# Patient Record
Sex: Male | Born: 1960
Health system: Southern US, Community
[De-identification: ages and names within clinical notes are randomized; demographics above are authoritative.]

## PROBLEM LIST (undated history)

## (undated) DIAGNOSIS — N319 Neuromuscular dysfunction of bladder, unspecified: Secondary | ICD-10-CM

## (undated) DIAGNOSIS — F419 Anxiety disorder, unspecified: Secondary | ICD-10-CM

## (undated) DIAGNOSIS — Z86711 Personal history of pulmonary embolism: Secondary | ICD-10-CM

## (undated) DIAGNOSIS — Q909 Down syndrome, unspecified: Secondary | ICD-10-CM

## (undated) DIAGNOSIS — F0390 Unspecified dementia without behavioral disturbance: Secondary | ICD-10-CM

## (undated) DIAGNOSIS — N400 Enlarged prostate without lower urinary tract symptoms: Secondary | ICD-10-CM

## (undated) DIAGNOSIS — K219 Gastro-esophageal reflux disease without esophagitis: Secondary | ICD-10-CM

## (undated) DIAGNOSIS — Z973 Presence of spectacles and contact lenses: Secondary | ICD-10-CM

## (undated) DIAGNOSIS — M1990 Unspecified osteoarthritis, unspecified site: Secondary | ICD-10-CM

## (undated) DIAGNOSIS — E785 Hyperlipidemia, unspecified: Secondary | ICD-10-CM

## (undated) DIAGNOSIS — Z8719 Personal history of other diseases of the digestive system: Secondary | ICD-10-CM

## (undated) DIAGNOSIS — IMO0002 Reserved for concepts with insufficient information to code with codable children: Secondary | ICD-10-CM

## (undated) DIAGNOSIS — F79 Unspecified intellectual disabilities: Secondary | ICD-10-CM

## (undated) DIAGNOSIS — N3020 Other chronic cystitis without hematuria: Secondary | ICD-10-CM

## (undated) DIAGNOSIS — N182 Chronic kidney disease, stage 2 (mild): Secondary | ICD-10-CM

## (undated) DIAGNOSIS — Z86718 Personal history of other venous thrombosis and embolism: Secondary | ICD-10-CM

## (undated) DIAGNOSIS — N1330 Unspecified hydronephrosis: Secondary | ICD-10-CM

## (undated) HISTORY — DX: Down syndrome, unspecified: Q90.9

---

## 2007-05-22 ENCOUNTER — Encounter: Admission: RE | Admit: 2007-05-22 | Discharge: 2007-05-22 | Payer: Self-pay | Admitting: Family Medicine

## 2010-01-20 ENCOUNTER — Encounter: Admission: RE | Admit: 2010-01-20 | Discharge: 2010-01-20 | Payer: Self-pay | Admitting: Family Medicine

## 2010-01-27 ENCOUNTER — Ambulatory Visit
Admission: RE | Admit: 2010-01-27 | Discharge: 2010-01-27 | Payer: Self-pay | Source: Home / Self Care | Admitting: Urology

## 2010-01-27 HISTORY — PX: OTHER SURGICAL HISTORY: SHX169

## 2010-01-28 ENCOUNTER — Emergency Department (HOSPITAL_COMMUNITY): Admission: EM | Admit: 2010-01-28 | Discharge: 2010-01-28 | Payer: Self-pay | Admitting: Emergency Medicine

## 2010-02-04 ENCOUNTER — Encounter (INDEPENDENT_AMBULATORY_CARE_PROVIDER_SITE_OTHER): Payer: Self-pay | Admitting: Emergency Medicine

## 2010-02-04 ENCOUNTER — Inpatient Hospital Stay (HOSPITAL_COMMUNITY)
Admission: EM | Admit: 2010-02-04 | Discharge: 2010-02-09 | Payer: Self-pay | Source: Home / Self Care | Attending: Internal Medicine | Admitting: Internal Medicine

## 2010-05-10 ENCOUNTER — Ambulatory Visit (HOSPITAL_BASED_OUTPATIENT_CLINIC_OR_DEPARTMENT_OTHER)
Admission: RE | Admit: 2010-05-10 | Discharge: 2010-05-10 | Disposition: A | Discharge status: Home / Self Care | Payer: Medicare Other | Attending: Urology | Admitting: Urology

## 2010-05-10 DIAGNOSIS — Q909 Down syndrome, unspecified: Secondary | ICD-10-CM | POA: Insufficient documentation

## 2010-05-10 DIAGNOSIS — N35919 Unspecified urethral stricture, male, unspecified site: Secondary | ICD-10-CM | POA: Insufficient documentation

## 2010-05-10 DIAGNOSIS — N1330 Unspecified hydronephrosis: Secondary | ICD-10-CM | POA: Insufficient documentation

## 2010-05-10 DIAGNOSIS — Z86718 Personal history of other venous thrombosis and embolism: Secondary | ICD-10-CM | POA: Insufficient documentation

## 2010-05-10 DIAGNOSIS — N319 Neuromuscular dysfunction of bladder, unspecified: Secondary | ICD-10-CM | POA: Insufficient documentation

## 2010-05-10 DIAGNOSIS — N189 Chronic kidney disease, unspecified: Secondary | ICD-10-CM | POA: Insufficient documentation

## 2010-05-10 DIAGNOSIS — Z7901 Long term (current) use of anticoagulants: Secondary | ICD-10-CM | POA: Insufficient documentation

## 2010-05-10 HISTORY — PX: OTHER SURGICAL HISTORY: SHX169

## 2010-05-14 NOTE — Op Note (Signed)
NAMEPARMOD, LEGORE NO.:  [id number]  MEDICAL RECORD NO.:  [id number]          PATIENT TYPE:  AMB  LOCATION:  [facility]                         FACILITY:  [facility]  PHYSICIAN:  Bernestine Amass, M.D.  DATE OF BIRTH:  12/08/1960  DATE OF PROCEDURE:  05/10/2010 DATE OF DISCHARGE:                              OPERATIVE REPORT   PREOPERATIVE DIAGNOSES: 1. Bilateral hydronephrosis with chronic renal insufficiency. 2. Urethral stricture disease. 3. Neurogenic bladder.  POSTOPERATIVE DIAGNOSES: 1. Bilateral hydronephrosis with chronic renal insufficiency. 2. Urethral stricture disease. 3. Neurogenic bladder. 4. Retained left double-J stent.  PROCEDURE PERFORMED:  Cystoscopy, balloon dilation of the urethra, right double-J stent removal, right retrograde pyelogram, right double-J stent insertion, left ureteroscopy with unsuccessful attempt to remove left double-J stent.  SURGEON:  Bernestine Amass, M.D.  ANESTHESIA:  General.  INDICATIONS:  Frank Levine is 50 years of age.  He has Down's syndrome and came to see [place] because of a renal ultrasound which revealed marked bladder thickening and a concern over possible bladder cancer.  The patient also was noted to have substantial hydroureter nephrosis with dilated ureters down to the bladder.  At the time of his initial assessment he was found to have severe urethral stricture disease which required dilation.  The bladder showed substantial thickening and trabecular change along with chronic inflammatory changes.  Biopsies were negative and there was no evidence of malignancy.  Retrograde pyelogram showed extrinsic compression of the distal intramural ureter secondary to bladder wall thickening with markedly dilated and tortuous ureters.  We were able to get double-J stents in both sides at that time with improvement in his overall renal function.  The patient, unfortunately, subsequently developed a deep venous thrombosis  and is on anticoagulation.  His voiding situation improved, but has worsened somewhat recently.  He is here now for endoscopic reassessment and determination whether he still needs indwelling double-J stents.  TECHNIQUE AND FINDINGS:  The patient remained on his anticoagulation. The patient did have placement of compression boots and received perioperative ciprofloxacin.  He was brought to surgery and had successful induction of general anesthesia.  He was placed in the lithotomy position and prepped and draped in the usual manner.  Initial cystoscopy revealed a recurrent urethral stricture at the level of the bulbar urethra.  It was not as severe as previous, but it was a relatively significant length at probably 1.5 cm.  A guidewire was placed through the urethra and the cystoscope removed.  The fascial dilating balloon was used at 24-French for 5 minutes.  This then allowed for passage of the cystoscope into his bladder, which again appeared to be less inflamed but continued to show thickening and trabecular change. The right ureteral stent was seen in good position and was partially extracted with a guidewire placed through the stent.  We then placed an open-end catheter and performed retrograde pyelogram with fluoroscopic interpretation.  The ureter continued to be markedly dilated with significant tortuosity, although somewhat improved from previous.  We felt that the patient would benefit from continued double-J stent placement and so we had made a  determination of the best way to manage his situation on a more permanent basis.  A new double-J stent was placed with fluoroscopic as well as direct visual guidance.  This was confirmed to be in good position.  When we went to remove the left double-J stent, it was fairly apparent that the stent had pulled back into his tortuous ureter.  Initial retrograde pyelogram showed marked tortuosity with some J hooking of that ureter and an end  of the stent that was several centimeters above the ureterovesical junction.  A guidewire was placed up to the proximal ureter.  The left distal ureter was then engaged with the ureteroscope.  Despite multiple attempts with a grasper and basket, we were unable to successfully remove the stent. We simply could not provide enough traction on the stent to allow it to be removed and every attempt we made to grasp the stent, which was easily visualized, was unsuccessful in actually extracting it.  After approximately 45 minutes, we felt that we would not be successful endoscopically and therefore the stent was left indwelling with the decision to be made at a later date as to whether to go back and attempt to remove that endoscopically from the bladder or to have Interventional Radiology attempt to extract his stent and replace it from an antegrade approach.  The patient was brought to the recovery room in stable condition.     Bernestine Amass, M.D.     DSG/MEDQ  D:  05/13/2010  T:  05/14/2010  Job:  [id number]  Electronically Signed by Rana Snare M.D. on 05/14/2010 01:47:56 PM

## 2010-05-18 LAB — CBC
HCT: 33.9 % — ABNORMAL LOW (ref 39.0–52.0)
HCT: 33.9 % — ABNORMAL LOW (ref 39.0–52.0)
HCT: 34.2 % — ABNORMAL LOW (ref 39.0–52.0)
HCT: 34.4 % — ABNORMAL LOW (ref 39.0–52.0)
HCT: 35.7 % — ABNORMAL LOW (ref 39.0–52.0)
HCT: 39.7 % (ref 39.0–52.0)
Hemoglobin: 11.7 g/dL — ABNORMAL LOW (ref 13.0–17.0)
Hemoglobin: 11.9 g/dL — ABNORMAL LOW (ref 13.0–17.0)
Hemoglobin: 11.9 g/dL — ABNORMAL LOW (ref 13.0–17.0)
Hemoglobin: 12.6 g/dL — ABNORMAL LOW (ref 13.0–17.0)
Hemoglobin: 14.3 g/dL (ref 13.0–17.0)
MCH: 33.7 pg (ref 26.0–34.0)
MCH: 34.3 pg — ABNORMAL HIGH (ref 26.0–34.0)
MCH: 34.9 pg — ABNORMAL HIGH (ref 26.0–34.0)
MCHC: 34.5 g/dL (ref 30.0–36.0)
MCHC: 34.6 g/dL (ref 30.0–36.0)
MCHC: 34.8 g/dL (ref 30.0–36.0)
MCHC: 34.8 g/dL (ref 30.0–36.0)
MCHC: 35.3 g/dL (ref 30.0–36.0)
MCHC: 36 g/dL (ref 30.0–36.0)
MCV: 96.8 fL (ref 78.0–100.0)
MCV: 97.5 fL (ref 78.0–100.0)
MCV: 98.3 fL (ref 78.0–100.0)
MCV: 98.3 fL (ref 78.0–100.0)
MCV: 98.8 fL (ref 78.0–100.0)
Platelets: 100 K/uL — ABNORMAL LOW (ref 150–400)
Platelets: 140 10*3/uL — ABNORMAL LOW (ref 150–400)
RBC: 4.1 MIL/uL — ABNORMAL LOW (ref 4.22–5.81)
RDW: 12.8 % (ref 11.5–15.5)
RDW: 13 % (ref 11.5–15.5)
RDW: 13.1 % (ref 11.5–15.5)
RDW: 13.1 % (ref 11.5–15.5)
RDW: 13.2 % (ref 11.5–15.5)
WBC: 11 K/uL — ABNORMAL HIGH (ref 4.0–10.5)
WBC: 8 10*3/uL (ref 4.0–10.5)
WBC: 8.9 10*3/uL (ref 4.0–10.5)

## 2010-05-18 LAB — DIFFERENTIAL
Basophils Absolute: 0.1 10*3/uL (ref 0.0–0.1)
Basophils Relative: 1 % (ref 0–1)
Eosinophils Absolute: 0 K/uL (ref 0.0–0.7)
Eosinophils Relative: 0 % (ref 0–5)
Lymphocytes Relative: 7 % — ABNORMAL LOW (ref 12–46)
Lymphs Abs: 0.7 K/uL (ref 0.7–4.0)
Monocytes Absolute: 1.3 10*3/uL — ABNORMAL HIGH (ref 0.1–1.0)
Monocytes Relative: 11 % (ref 3–12)
Neutro Abs: 9 K/uL — ABNORMAL HIGH (ref 1.7–7.7)
Neutrophils Relative %: 81 % — ABNORMAL HIGH (ref 43–77)

## 2010-05-18 LAB — BETA-2-GLYCOPROTEIN I ABS, IGG/M/A
Beta-2 Glyco I IgG: 1 G Units (ref <20)
Beta-2-Glycoprotein I IgM: 5 M Units (ref <20)

## 2010-05-18 LAB — URINE CULTURE
Colony Count: NO GROWTH
Culture  Setup Time: 201112060122
Culture: NO GROWTH

## 2010-05-18 LAB — BASIC METABOLIC PANEL WITH GFR
Calcium: 8.4 mg/dL (ref 8.4–10.5)
Creatinine, Ser: 1.51 mg/dL — ABNORMAL HIGH (ref 0.4–1.5)
GFR calc Af Amer: 59 mL/min — ABNORMAL LOW (ref >60)
GFR calc non Af Amer: 49 mL/min — ABNORMAL LOW (ref >60)
Sodium: 135 meq/L (ref 135–145)

## 2010-05-18 LAB — CARDIOLIPIN ANTIBODIES, IGG, IGM, IGA
Anticardiolipin IgG: 9 GPL U/mL — ABNORMAL LOW (ref <23)
Anticardiolipin IgM: 1 MPL U/mL — ABNORMAL LOW (ref <11)

## 2010-05-18 LAB — LUPUS ANTICOAGULANT PANEL
DRVVT: 59.1 secs — ABNORMAL HIGH (ref 36.2–44.3)
Lupus Anticoagulant: DETECTED — AB
dRVVT Incubated 1:1 Mix: 44.2 secs (ref 36.2–44.3)

## 2010-05-18 LAB — URINALYSIS, ROUTINE W REFLEX MICROSCOPIC
Glucose, UA: NEGATIVE mg/dL
Ketones, ur: NEGATIVE mg/dL
Nitrite: NEGATIVE
Nitrite: NEGATIVE
Protein, ur: NEGATIVE mg/dL
Specific Gravity, Urine: 1.008 (ref 1.005–1.030)
Urobilinogen, UA: 0.2 mg/dL (ref 0.0–1.0)
Urobilinogen, UA: 0.2 mg/dL (ref 0.0–1.0)

## 2010-05-18 LAB — BASIC METABOLIC PANEL
BUN: 16 mg/dL (ref 6–23)
BUN: 21 mg/dL (ref 6–23)
BUN: 21 mg/dL (ref 6–23)
CO2: 25 mEq/L (ref 19–32)
CO2: 28 mEq/L (ref 19–32)
Chloride: 101 mEq/L (ref 96–112)
Creatinine, Ser: 1.39 mg/dL (ref 0.4–1.5)
GFR calc non Af Amer: 50 mL/min — ABNORMAL LOW (ref >60)
GFR calc non Af Amer: 54 mL/min — ABNORMAL LOW (ref >60)
Glucose, Bld: 123 mg/dL — ABNORMAL HIGH (ref 70–99)
Glucose, Bld: 126 mg/dL — ABNORMAL HIGH (ref 70–99)
Glucose, Bld: 99 mg/dL (ref 70–99)
Potassium: 4.1 mEq/L (ref 3.5–5.1)
Potassium: 4.4 mEq/L (ref 3.5–5.1)
Potassium: 4.7 mEq/L (ref 3.5–5.1)

## 2010-05-18 LAB — PROTIME-INR
INR: 1.19 (ref 0.00–1.49)
INR: 2.31 — ABNORMAL HIGH (ref 0.00–1.49)
Prothrombin Time: 15.3 s — ABNORMAL HIGH (ref 11.6–15.2)
Prothrombin Time: 29.2 seconds — ABNORMAL HIGH (ref 11.6–15.2)

## 2010-05-18 LAB — HOMOCYSTEINE: Homocysteine: 14.6 umol/L (ref 4.0–15.4)

## 2010-05-18 LAB — APTT: aPTT: 37 s (ref 24–37)

## 2010-05-18 LAB — URINE MICROSCOPIC-ADD ON

## 2010-05-18 LAB — ANTITHROMBIN III: AntiThromb III Func: 140 % — ABNORMAL HIGH (ref 76–126)

## 2010-05-18 LAB — PROTHROMBIN GENE MUTATION

## 2010-05-18 LAB — PROTEIN C, TOTAL: Protein C, Total: 95 % (ref 70–140)

## 2010-07-26 ENCOUNTER — Ambulatory Visit (HOSPITAL_BASED_OUTPATIENT_CLINIC_OR_DEPARTMENT_OTHER)
Admission: RE | Admit: 2010-07-26 | Discharge: 2010-07-26 | Disposition: A | Discharge status: Home / Self Care | Payer: Medicare Other | Source: Ambulatory Visit | Attending: Urology | Admitting: Urology

## 2010-07-26 DIAGNOSIS — N319 Neuromuscular dysfunction of bladder, unspecified: Secondary | ICD-10-CM | POA: Insufficient documentation

## 2010-07-26 DIAGNOSIS — IMO0002 Reserved for concepts with insufficient information to code with codable children: Secondary | ICD-10-CM | POA: Insufficient documentation

## 2010-07-26 DIAGNOSIS — Q909 Down syndrome, unspecified: Secondary | ICD-10-CM | POA: Insufficient documentation

## 2010-07-26 DIAGNOSIS — N135 Crossing vessel and stricture of ureter without hydronephrosis: Secondary | ICD-10-CM | POA: Insufficient documentation

## 2010-07-26 DIAGNOSIS — Z86718 Personal history of other venous thrombosis and embolism: Secondary | ICD-10-CM | POA: Insufficient documentation

## 2010-07-26 DIAGNOSIS — K219 Gastro-esophageal reflux disease without esophagitis: Secondary | ICD-10-CM | POA: Insufficient documentation

## 2010-07-26 DIAGNOSIS — N1330 Unspecified hydronephrosis: Secondary | ICD-10-CM | POA: Insufficient documentation

## 2010-07-26 DIAGNOSIS — Z7901 Long term (current) use of anticoagulants: Secondary | ICD-10-CM | POA: Insufficient documentation

## 2010-07-30 ENCOUNTER — Emergency Department (HOSPITAL_COMMUNITY): Payer: Medicare Other

## 2010-07-30 ENCOUNTER — Inpatient Hospital Stay (HOSPITAL_COMMUNITY)
Admission: EM | Admit: 2010-07-30 | Discharge: 2010-08-04 | DRG: 699 | Disposition: A | Discharge status: Home Health | Payer: Medicare Other | Attending: Family Medicine | Admitting: Family Medicine

## 2010-07-30 DIAGNOSIS — G4733 Obstructive sleep apnea (adult) (pediatric): Secondary | ICD-10-CM | POA: Diagnosis present

## 2010-07-30 DIAGNOSIS — D62 Acute posthemorrhagic anemia: Secondary | ICD-10-CM | POA: Diagnosis present

## 2010-07-30 DIAGNOSIS — E785 Hyperlipidemia, unspecified: Secondary | ICD-10-CM | POA: Diagnosis present

## 2010-07-30 DIAGNOSIS — N3289 Other specified disorders of bladder: Principal | ICD-10-CM | POA: Diagnosis present

## 2010-07-30 DIAGNOSIS — N390 Urinary tract infection, site not specified: Secondary | ICD-10-CM | POA: Diagnosis present

## 2010-07-30 DIAGNOSIS — N1330 Unspecified hydronephrosis: Secondary | ICD-10-CM | POA: Diagnosis present

## 2010-07-30 DIAGNOSIS — R310 Gross hematuria: Secondary | ICD-10-CM | POA: Diagnosis present

## 2010-07-30 DIAGNOSIS — N319 Neuromuscular dysfunction of bladder, unspecified: Secondary | ICD-10-CM | POA: Diagnosis present

## 2010-07-30 DIAGNOSIS — R109 Unspecified abdominal pain: Secondary | ICD-10-CM | POA: Diagnosis present

## 2010-07-30 DIAGNOSIS — R0902 Hypoxemia: Secondary | ICD-10-CM | POA: Diagnosis present

## 2010-07-30 DIAGNOSIS — Z86711 Personal history of pulmonary embolism: Secondary | ICD-10-CM

## 2010-07-30 DIAGNOSIS — Q909 Down syndrome, unspecified: Secondary | ICD-10-CM

## 2010-07-30 DIAGNOSIS — Z9889 Other specified postprocedural states: Secondary | ICD-10-CM

## 2010-07-30 LAB — URINALYSIS, ROUTINE W REFLEX MICROSCOPIC
Glucose, UA: 100 mg/dL — AB
Ketones, ur: 40 mg/dL — AB
Protein, ur: >300 mg/dL — AB

## 2010-07-30 LAB — CBC
Hemoglobin: 8.7 g/dL — ABNORMAL LOW (ref 13.0–17.0)
RBC: 2.59 MIL/uL — ABNORMAL LOW (ref 4.22–5.81)
WBC: 11.4 10*3/uL — ABNORMAL HIGH (ref 4.0–10.5)

## 2010-07-30 LAB — BASIC METABOLIC PANEL
Chloride: 102 mEq/L (ref 96–112)
GFR calc Af Amer: >60 mL/min (ref >60)
Potassium: 4.3 mEq/L (ref 3.5–5.1)

## 2010-07-30 LAB — PROTIME-INR
INR: 2.04 — ABNORMAL HIGH (ref 0.00–1.49)
Prothrombin Time: 23.2 seconds — ABNORMAL HIGH (ref 11.6–15.2)

## 2010-07-30 LAB — LACTIC ACID, PLASMA: Lactic Acid, Venous: 2.1 mmol/L (ref 0.5–2.2)

## 2010-07-30 NOTE — H&P (Signed)
NAMEEULOJIO, SCALZO NO.:  [id number]  MEDICAL RECORD NO.:  [id number]           PATIENT TYPE:  E  LOCATION:  [facility]                         FACILITY:  [facility]  PHYSICIAN:  Azzie Roup, MD      DATE OF BIRTH:  06/11/1960  DATE OF ADMISSION:  07/30/2010 DATE OF DISCHARGE:                             HISTORY & PHYSICAL   CHIEF COMPLAINT:  Urinary retention.  HISTORY OF PRESENT ILLNESS:  This is a 50 year old gentleman with Down syndrome with a complicated past urologic history and recent series of urologic procedures on Jul 26, 2010, who presents to the ER with urinary retention after his Foley was removed 2 days ago.  On Jul 26, 2010, he underwent cystoscopy, urethral dilatation, right double-J stent removal, left ureteroscopy, attempt to removal of left double-J stent and failed and placement of the second left double-J stent Foley catheter placement.  The patient's Foley was removed by urology several days ago and he presents to ER due to urinary retention.  In the ER, a Foley was replaced and clots and red/pink urine returned.  Also, on blood work, it was noted hemoglobin had significantly dropped to 8.7 today from 15.7 four days ago.  The patient also complains of some very mild lower abdominal/suprapubic soreness, but states that this has actually improved since the time of the surgery.  Denies any fevers and chills. He had one bout of nausea earlier that resolved with antiemetics.  While in the ER, his blood pressure dropped briefly to the 90s prompting administration of IV fluids.  He is also receiving blood.  Also, of note, he has been on Coumadin due to history of DVT and PE approximately 6 months ago.  This was in a post-surgical setting so the decision has been made to continue treatment for only 6 months after talking with urology and the patient's primary care provider, decision has been made to reverse his Coumadin due to his active bleeding and he  is receiving vitamin K and FFP per the ER in addition to 1 unit of blood.  ALLERGIES:  No known drug allergies.  MEDICATIONS:  Include: 1. Warfarin 5 mg p.o. q.h.s., which is now being stopped. 2. Colace 100 mg p.o. q.h.s. 3. Simvastatin 40 mg p.o. daily.  PAST MEDICAL HISTORY:  Significant for multiple urologic problems including urologic strictures, prior stent placement, bilateral hydronephrosis, retained prior stent, hyperlipidemia and Down syndrome  SOCIAL HISTORY:  He lives with his mother in [place], [place].  He is quite functional and actually assist his mom in many of her activities. He denies any history of tobacco, alcohol or drug use.  FAMILY HISTORY:  There are no premature cancers or coronary artery disease in the family.  REVIEW OF SYSTEMS:  Review of 12 systems is negative with the exception of those complaints previously detailed in the HPI.  PHYSICAL EXAMINATION:  VITAL SIGNS:  Temperature 99.4, blood pressure 125/75, pulse 111, respirations 16, 96% on room air. GENERAL:  This is a middle-aged gentleman with Down syndrome, in no acute distress, awake, alert, oriented x3. HEENT EXAMINATION:  Normocephalic, atraumatic.  Extraocular movements are intact.  Moist mucous membranes. NECK:  No jugular venous distention. CARDIOVASCULAR EXAMINATION:  Regular rate and rhythm, S1, S2, no murmurs. LUNGS:  Clear to auscultation bilaterally.  No crackles, wheezes or rhonchi. ABDOMEN:  Overall soft.  There is some mild tenderness in the suprapubic area.  Normoactive bowel sounds. EXTREMITIES:  No pretibial edema. PSYCHIATRIC:  Appropriate mood and affect.  LABORATORY DATA:  WBC 11.4; hemoglobin 8.7, of note, it was 15.7 on the Jul 26, 2010; hematocrit 25.2; platelets 209,000.  Sodium 137, potassium 4.3, chloride 102, bicarb 24, BUN 14, creatinine 1.27, glucose 154 and INR is 2.  Urinalysis reveals large leukocyte esterase, positive nitrite, no protein, 40 ketones, 100  glucose and large amount of RBCs. Lactate is 2.1, which is with the upper limit of normal in our lab being 2.2.  DIAGNOSTIC STUDIES:  Chest x-ray reveals mild cardiomegaly.  ASSESSMENT/PLAN:  [age]year-old gentleman with Down syndrome with issues with hematuria and hypotension in the Emergency Room. 1. Hematuria:  The hematuria likely is reflective of post-surgical urological issues     as he is status post multiple urologic procedures 4 days ago.     Urology has been consulted.  We will withhold any food tonight     until we know if he needs to have any operative treatment this     evening.  We will defer to urology as to whether any specific imaging is     warranted.  In the meantime, for his hematuria, we will support him     by reversing his INR with vitamin K and fresh frozen plasma and providing blood     as necessary.  He has received 1 unit by the Emergency Room due to     the continued blood in his Foley bag.  We will provide another unit     of packed red blood cells now and continue to check his hemoglobin     every 6 hours.  We will continue to provide him with intravenous     fluids.  He has been given ceftriaxone to treat a possible urologic infection. 2. Hypotension:  This is likely secondary to blood loss.  We are     replacing his blood volume and we will keep careful track of this     as the patient is monitored in the step-down unit.  Ceftriaxone has been      provided as well in case infection is contributing to hypotension. 3. Hyperlipidemia:  We will continue his Zocor. 4. Code status:  Patient wishes to be a full code.     Azzie Roup, MD     HR/MEDQ  D:  07/30/2010  T:  07/30/2010  Job:  [id number]  Electronically Signed by Azzie Roup MD on 07/30/2010 09:00:26 PM

## 2010-07-31 LAB — BASIC METABOLIC PANEL
CO2: 28 mEq/L (ref 19–32)
Calcium: 7.1 mg/dL — ABNORMAL LOW (ref 8.4–10.5)
Chloride: 108 mEq/L (ref 96–112)
GFR calc Af Amer: >60 mL/min (ref >60)
Sodium: 141 mEq/L (ref 135–145)

## 2010-07-31 LAB — URINE CULTURE: Culture  Setup Time: 201205251822

## 2010-07-31 LAB — CBC
HCT: 24.3 % — ABNORMAL LOW (ref 39.0–52.0)
HCT: 25.3 % — ABNORMAL LOW (ref 39.0–52.0)
Hemoglobin: 8.5 g/dL — ABNORMAL LOW (ref 13.0–17.0)
MCV: 94.2 fL (ref 78.0–100.0)
MCV: 92.7 fL (ref 78.0–100.0)
Platelets: 161 10*3/uL (ref 150–400)
RBC: 2.58 MIL/uL — ABNORMAL LOW (ref 4.22–5.81)
RDW: 16 % — ABNORMAL HIGH (ref 11.5–15.5)
WBC: 11.7 10*3/uL — ABNORMAL HIGH (ref 4.0–10.5)
WBC: 8.7 10*3/uL (ref 4.0–10.5)

## 2010-07-31 LAB — APTT: aPTT: 38 seconds — ABNORMAL HIGH (ref 24–37)

## 2010-07-31 LAB — HEMOGLOBIN AND HEMATOCRIT, BLOOD
HCT: 22.7 % — ABNORMAL LOW (ref 39.0–52.0)
Hemoglobin: 8 g/dL — ABNORMAL LOW (ref 13.0–17.0)
Hemoglobin: 8.2 g/dL — ABNORMAL LOW (ref 13.0–17.0)

## 2010-08-01 ENCOUNTER — Inpatient Hospital Stay (HOSPITAL_COMMUNITY): Payer: Medicare Other

## 2010-08-01 LAB — CBC
MCHC: 34.8 g/dL (ref 30.0–36.0)
MCV: 94.1 fL (ref 78.0–100.0)
Platelets: 138 10*3/uL — ABNORMAL LOW (ref 150–400)
RBC: 2.71 MIL/uL — ABNORMAL LOW (ref 4.22–5.81)
RDW: 16 % — ABNORMAL HIGH (ref 11.5–15.5)
WBC: 7.2 10*3/uL (ref 4.0–10.5)

## 2010-08-01 LAB — BASIC METABOLIC PANEL
Calcium: 7 mg/dL — ABNORMAL LOW (ref 8.4–10.5)
Creatinine, Ser: 1.35 mg/dL (ref 0.4–1.5)
GFR calc Af Amer: >60 mL/min (ref >60)
GFR calc non Af Amer: 56 mL/min — ABNORMAL LOW (ref >60)

## 2010-08-01 LAB — HEMOGLOBIN AND HEMATOCRIT, BLOOD
HCT: 25.8 % — ABNORMAL LOW (ref 39.0–52.0)
Hemoglobin: 8.9 g/dL — ABNORMAL LOW (ref 13.0–17.0)

## 2010-08-01 LAB — PROTIME-INR
INR: 1.24 (ref 0.00–1.49)
Prothrombin Time: 15.8 seconds — ABNORMAL HIGH (ref 11.6–15.2)

## 2010-08-01 LAB — APTT: aPTT: 38 seconds — ABNORMAL HIGH (ref 24–37)

## 2010-08-01 MED ORDER — IOHEXOL 300 MG/ML  SOLN
100.0000 mL | Freq: Once | INTRAMUSCULAR | Status: AC | PRN
  Administered 2010-08-01: 100 mL via INTRAVENOUS

## 2010-08-02 LAB — CBC
MCV: 95.2 fL (ref 78.0–100.0)
Platelets: 154 10*3/uL (ref 150–400)
RBC: 2.7 MIL/uL — ABNORMAL LOW (ref 4.22–5.81)
RDW: 15.8 % — ABNORMAL HIGH (ref 11.5–15.5)
WBC: 6 10*3/uL (ref 4.0–10.5)

## 2010-08-02 LAB — BASIC METABOLIC PANEL
BUN: 11 mg/dL (ref 6–23)
Chloride: 112 mEq/L (ref 96–112)
GFR calc Af Amer: >60 mL/min (ref >60)
GFR calc non Af Amer: >60 mL/min (ref >60)
Potassium: 3.8 mEq/L (ref 3.5–5.1)
Sodium: 144 mEq/L (ref 135–145)

## 2010-08-03 LAB — CBC
HCT: 26.4 % — ABNORMAL LOW (ref 39.0–52.0)
Hemoglobin: 8.9 g/dL — ABNORMAL LOW (ref 13.0–17.0)
MCV: 95.3 fL (ref 78.0–100.0)
RBC: 2.77 MIL/uL — ABNORMAL LOW (ref 4.22–5.81)
RDW: 15.5 % (ref 11.5–15.5)
WBC: 5.4 10*3/uL (ref 4.0–10.5)

## 2010-08-03 LAB — TYPE AND SCREEN
ABO/RH(D): O NEG
Antibody Screen: NEGATIVE
Unit division: 0
Unit division: 0

## 2010-08-04 LAB — BASIC METABOLIC PANEL
CO2: 27 mEq/L (ref 19–32)
Calcium: 8.2 mg/dL — ABNORMAL LOW (ref 8.4–10.5)
Chloride: 107 mEq/L (ref 96–112)
Creatinine, Ser: 1.03 mg/dL (ref 0.4–1.5)
GFR calc Af Amer: >60 mL/min (ref >60)
Glucose, Bld: 104 mg/dL — ABNORMAL HIGH (ref 70–99)

## 2010-08-04 LAB — CBC
HCT: 28.9 % — ABNORMAL LOW (ref 39.0–52.0)
Hemoglobin: 9.5 g/dL — ABNORMAL LOW (ref 13.0–17.0)
MCH: 31.6 pg (ref 26.0–34.0)
MCHC: 32.9 g/dL (ref 30.0–36.0)
MCV: 96 fL (ref 78.0–100.0)
RBC: 3.01 MIL/uL — ABNORMAL LOW (ref 4.22–5.81)

## 2010-08-04 LAB — PREPARE FRESH FROZEN PLASMA: Unit division: 0

## 2010-08-09 NOTE — Op Note (Signed)
NAMERIELY, HERNANDEZ NO.:  [id number]  MEDICAL RECORD NO.:  [id number]           PATIENT TYPE:  LOCATION:                                 FACILITY:  PHYSICIAN:  Bernestine Amass, M.D.  DATE OF BIRTH:  1960-08-16  DATE OF PROCEDURE: DATE OF DISCHARGE:                              OPERATIVE REPORT   PREOPERATIVE DIAGNOSES: 1. Urethral stricture disease. 2. Neurogenic bladder. 3. History of bilateral hydronephrosis. 4. Retained double-J stent, left side.  POSTOPERATIVE DIAGNOSES: 1. Urethral stricture disease. 2. Neurogenic bladder. 3. History of bilateral hydronephrosis. 4. Retained double-J stent, left side.  PROCEDURE PERFORMED:  Cystoscopy, urethral dilation, right double-J stent removal, left ureteroscopy, attempt at removal of left double-J stent and failed, placement of second left double-J stent and Foley catheter placement.  SURGEON:  Bernestine Amass, M.D.  ANESTHESIA:  General.  INDICATIONS:  Frank Levine is currently 50 years of age.  He has a complicated urologic history over the last several months.  The patient was noted to have a markedly thickened and distended urinary bladder with severe bilateral hydronephrosis, marked ureteral tortuosity and elevated creatinine of 1.9.  Assessment revealed severe urethral stricture disease with incomplete bladder emptying and marked intramural thickening.  His hydronephrosis was felt to be probably on the basis of both a bladder outlet obstruction from the stricture as well as potential obstruction at the level of the intramural ureter.  Both ureters were markedly tortuous.  His creatinine did improve.  He has had several procedures for urethral stricturing, which have included several balloon dilations.  The patient currently has a right double-J stent in place as well as a left double-J stent.  The right stent was exchanged approximately 2 months ago.  The left side had retracted and was in the distal  ureter and we were unable to successfully exchange that stent. He presents now for reassessment along with consideration of removal of the right stent for possible exchange and attempt to try to remove the left double-J stent.  Of note, the patient is on Coumadin which he has been on for 6 months for DVT, pulmonary embolus, but is probably going to get off the next month.  We elected to continue his anticoagulation.  TECHNIQUE AND FINDINGS:  The patient was brought to the operating room. He had successful induction of general anesthesia.  Cystoscopy again revealed narrowing over a fairly long length of his bulbar urethra.  He was certainly not tight at this point but did not allow for safe passage of the cystoscope.  For that reason, Leander Rams sounds were used to dilate him gently at which point we were then able to perform cystoscopy.  The right double-J stent was seen in good position.  We elected to remove that stent at this time to carefully monitor his renal function and hydronephrosis on the right side.  The left stent again was not visible.  With fluoroscopic guidance, we were able place a guidewire into the left ureteral orifice.  Again the distal ureter took a 360-degree loop just underneath the ureterovesical junction and then showed continued marked tortuosity.  After quite a bit of time, we were able to eventually get the guidewire up to the left renal pelvis.  Initial attempts at engaging the distal left ureter were difficult and we were unable to safely advance the scope again due to tortuosity.  We used an access sheath to provide some mild dilation.  We were then able to place a second wire and over the wire, was able to advance the rigid ureteroscope to the point where I could see the stent.  Despite that, multiple attempts at grasping the stent were unsuccessful and being able to remove it.  We felt that we were really hampered and limited by the degree of tortuosity  of that distal ureter.  I felt that if a second stent was placed and left indwelling for several weeks to potentially several months that it might afford a much easier access to the distal ureter with hopefully much more high degree of success of extracting the migrated stent.  In the interim by placing a second stent, we felt that we could try to assure good drainage on that left side.  A new 6-French 28-cm double-J stent was placed over the guidewire with fluoroscopic as well as visual guidance.  The patient then had a guidewire placed through his bladder and over that we placed an 18-French [place]ill Foley catheter and his Foley was left indwelling for approximately 48 hours. He will return to the office to have that removed.  Urine was a light-to- medium pink at the completion of the procedure.  No other complications or problems occurred.     Bernestine Amass, M.D.     DSG/MEDQ  D:  07/26/2010  T:  07/26/2010  Job:  [id number]  Electronically Signed by Rana Snare M.D. on 08/09/2010 12:09:54 PM

## 2010-08-09 NOTE — Discharge Summary (Signed)
Frank Levine, LOJEWSKI NO.:  [id number]  MEDICAL RECORD NO.:  [id number]           PATIENT TYPE:  I  LOCATION:  [id number]                         [facility]AC[facility]  PHYSICIAN:  Murray Hodgkins, MD    DATE OF BIRTH:  1960-05-26  DATE OF ADMISSION:  07/30/2010 DATE OF DISCHARGE:  08/04/2010                              DISCHARGE SUMMARY   PRIMARY CARE PHYSICIAN:  [name].  PRIMARY UROLOGIST:  Bernestine Amass, MD  CONDITION ON DISCHARGE:  Improved.  DISPOSITION:  The patient will be discharged home with home health for Foley catheter care.  DISCHARGE DIAGNOSES: 1. Gross hematuria with a large clot in the bladder. 2. Urinary retention, Foley catheter will be placed on discharge. 3. Acute blood loss anemia status post 2 units of packed red blood     cells. 4. History of neurogenic bladder. 5. Possible urinary tract infection. 6. History of pulmonary embolism. 7. Possible obstructive sleep apnea.  HISTORY OF PRESENT ILLNESS:  This is a 50 year old male who presented to the emergency room with urinary retention.  He had developed abdominal pain and suprapubic pain.  Came to the emergency room, had a Foley catheter placed, and then had gross hematuria.  He was admitted for further evaluation and treated.  Of note, the patient just recently had a urologic procedure 4 days prior to admission.  HOSPITAL COURSE: 1. Gross hematuria.  The patient was admitted, he was transfused 2     units of packed red blood cells.  His Coumadin was reversed.  He     was seen in consultation with Urology.  Further imaging did     demonstrate large blood clot in the bladder.  Foley catheter has     been left in place.  Urology has reevaluated the patient today and     feels that he is stable for discharge.  His hemoglobin has remained     stable status post 2 units of packed red blood cells.  He is to     follow up with Dr. Risa Grill next week, August 10, 2010.  The  patient     will be set up with home health for Foley catheter care and     irrigation as needed.  He has had no evidence of fresh bleeding     during this hospitalization. 2. Acute blood loss anemia.  As above. 3. Neurogenic bladder.  Foley catheter remains placed on discharge. 4. Arm edema.  The patient had infiltration from her IV site.  His     right upper extremity has subcutaneous edema.  It is nontender to     palpation.  No erythema or evidence to suggest infection. 5. Empiric treatment for urinary tract infection.  Given the patient's     recent instrumentation, he was treated with Rocephin and then oral     antibiotics.  Antibiotics to complete a total 7-day course which he     will complete tomorrow.  Note that his urine culture was negative. 6. Possible obstructive sleep apnea.  Could consider outpatient sleep     study.  I note that most recent nighttime oxygen saturations were     reported as being within normal limits. 7. History of pulmonary embolism.  The admitting physician to discuss     the case with the patient's primary care provider in Urology and     the decision was made to reverse the patient's Coumadin.  As the     patient has had 6 months of treatment for DVT and PE and this was     his first episode, his Coumadin has been discontinued.  This has     been discussed with family and they are in agreement at this time.  CONSULTATIONS:  Urology.  Their recommendations as above.  PROCEDURES:  Transfusion of 2 units of packed red blood cells.  IMAGING: 1. Chest x-ray Jul 30, 2010:  Mild cardiomegaly, otherwise negative. 2. CT of the abdomen and pelvis Aug 01, 2010:  Persistent dilatation     of both renal collecting systems to the level of the bladder.  Two     ureteral stents in place in the left.  Bladder with blood in the     large diverticulum at the posterior lateral aspect of the right     side of the bladder.  Degree of hydronephrosis has increased      bilaterally since November 16th.  Diffuse thickening of the bladder     wall.  Soft tissue inflammatory edema surrounding the bladder.  MICROBIOLOGY: 1. Urine culture no growth, final. 2. CBC notable for hemoglobin of 8.7 on admission, which was a drop     from previous hemoglobin of 15.7.  On discharge 9.5 and stable. 3. INR on admission was 2.04. 4. Basic metabolic panel unremarkable. 5. Urinalysis was positive.  Blood culture was negative.  PHYSICAL EXAMINATION ON DISCHARGE:  GENERAL:  The patient is feeling well.  No complaints.  He is eating well.  He has had a bowel movement today.  He has no pain. VITAL SIGNS:  Temperature is 98.8, pulse 102, respirations 16, blood pressure 113/70, sat 95% on room air. CARDIOVASCULAR:  Regular rate and rhythm.  No murmur, rub, or gallop. RESPIRATORY:  Clear to auscultation bilaterally.  No wheezes, rales, or rhonchi.  Normal respiratory effort. ABDOMEN:  Soft, nontender, nondistended. EXTREMITIES:  Right upper extremity has subcutaneous edema, which is nontender to palpation.  There is no erythema or evidence of infection.  DISCHARGE INSTRUCTIONS:  The patient will be discharged home today with home health for Foley catheter care and irrigation as needed.  DIET:  Heart-healthy.  ACTIVITIES:  Unrestricted.  Followup with Dr. Risa Grill August 10, 2010, as directed and with [place] in 2 weeks.  DISCHARGE MEDICATIONS: 1. Acetaminophen 325 mg 2 tablets every 4 hours as needed for pain. 2. Keflex 500 mg p.o. q.i.d., complete Aug 05, 2010. 3. Colace 100 mg p.o. daily. 4. Simvastatin 40 mg p.o. daily. 5. Polyethylene glycol 17 g p.o. daily as needed for constipation.  Discontinue the following medications:  Warfarin.  Time coordinating discharge 30 minutes.     Murray Hodgkins, MD     DG/MEDQ  D:  08/04/2010  T:  08/05/2010  Job:  [id number]  cc:   [facility] Bernestine Amass,  M.D.  Electronically Signed by Murray Hodgkins  on 08/09/2010 05:30:53 PM

## 2010-09-13 ENCOUNTER — Encounter: Payer: Medicare Other | Admitting: Oncology

## 2010-09-15 ENCOUNTER — Encounter (HOSPITAL_BASED_OUTPATIENT_CLINIC_OR_DEPARTMENT_OTHER): Payer: Medicare Other | Admitting: Oncology

## 2010-09-15 ENCOUNTER — Other Ambulatory Visit: Payer: Self-pay | Admitting: Oncology

## 2010-09-15 DIAGNOSIS — I824Z9 Acute embolism and thrombosis of unspecified deep veins of unspecified distal lower extremity: Secondary | ICD-10-CM

## 2010-09-15 DIAGNOSIS — Q909 Down syndrome, unspecified: Secondary | ICD-10-CM

## 2010-09-15 DIAGNOSIS — I2699 Other pulmonary embolism without acute cor pulmonale: Secondary | ICD-10-CM

## 2010-09-15 DIAGNOSIS — D509 Iron deficiency anemia, unspecified: Secondary | ICD-10-CM

## 2010-09-15 LAB — CBC & DIFF AND RETIC
Basophils Absolute: 0.1 10*3/uL (ref 0.0–0.1)
EOS%: 3.9 % (ref 0.0–7.0)
Eosinophils Absolute: 0.1 10*3/uL (ref 0.0–0.5)
LYMPH%: 25.5 % (ref 14.0–49.0)
MCH: 30.9 pg (ref 27.2–33.4)
MCV: 95.1 fL (ref 79.3–98.0)
MONO%: 9.7 % (ref 0.0–14.0)
NEUT#: 1.5 10*3/uL (ref 1.5–6.5)
Platelets: 220 10*3/uL (ref 140–400)
RBC: 4.05 10*6/uL — ABNORMAL LOW (ref 4.20–5.82)
Retic %: 1.6 % (ref 0.50–1.60)

## 2010-09-15 LAB — IRON AND TIBC
TIBC: 270 ug/dL (ref 215–435)
UIBC: 65 ug/dL

## 2010-09-15 LAB — D-DIMER, QUANTITATIVE: D-Dimer, Quant: 1.26 ug/mL-FEU — ABNORMAL HIGH (ref 0.00–0.48)

## 2010-09-15 LAB — FERRITIN: Ferritin: 34 ng/mL (ref 22–322)

## 2010-09-27 ENCOUNTER — Other Ambulatory Visit: Payer: Self-pay | Admitting: Oncology

## 2010-09-27 ENCOUNTER — Encounter (HOSPITAL_BASED_OUTPATIENT_CLINIC_OR_DEPARTMENT_OTHER): Payer: Medicare Other | Admitting: Oncology

## 2010-09-27 DIAGNOSIS — I2699 Other pulmonary embolism without acute cor pulmonale: Secondary | ICD-10-CM

## 2010-09-27 DIAGNOSIS — I824Z9 Acute embolism and thrombosis of unspecified deep veins of unspecified distal lower extremity: Secondary | ICD-10-CM

## 2010-09-27 DIAGNOSIS — Q909 Down syndrome, unspecified: Secondary | ICD-10-CM

## 2010-09-27 DIAGNOSIS — D509 Iron deficiency anemia, unspecified: Secondary | ICD-10-CM

## 2010-09-27 LAB — MORPHOLOGY: PLT EST: ADEQUATE

## 2010-09-27 LAB — CBC WITH DIFFERENTIAL/PLATELET
BASO%: 0.6 % (ref 0.0–2.0)
EOS%: 2.6 % (ref 0.0–7.0)
MCH: 32.2 pg (ref 27.2–33.4)
MCHC: 33.7 g/dL (ref 32.0–36.0)
MONO#: 0.4 10*3/uL (ref 0.1–0.9)
RDW: 18.8 % — ABNORMAL HIGH (ref 11.0–14.6)
WBC: 4.6 10*3/uL (ref 4.0–10.3)
lymph#: 1 10*3/uL (ref 0.9–3.3)

## 2010-10-17 HISTORY — PX: OTHER SURGICAL HISTORY: SHX169

## 2010-10-27 ENCOUNTER — Ambulatory Visit (HOSPITAL_BASED_OUTPATIENT_CLINIC_OR_DEPARTMENT_OTHER)
Admission: RE | Admit: 2010-10-27 | Discharge: 2010-10-27 | Disposition: A | Discharge status: Home / Self Care | Payer: Medicare Other | Source: Ambulatory Visit | Attending: Urology | Admitting: Urology

## 2010-10-27 DIAGNOSIS — N35919 Unspecified urethral stricture, male, unspecified site: Secondary | ICD-10-CM | POA: Insufficient documentation

## 2010-10-27 DIAGNOSIS — N1330 Unspecified hydronephrosis: Secondary | ICD-10-CM | POA: Insufficient documentation

## 2010-10-27 DIAGNOSIS — Q909 Down syndrome, unspecified: Secondary | ICD-10-CM | POA: Insufficient documentation

## 2010-10-27 DIAGNOSIS — N319 Neuromuscular dysfunction of bladder, unspecified: Secondary | ICD-10-CM | POA: Insufficient documentation

## 2010-10-27 DIAGNOSIS — N135 Crossing vessel and stricture of ureter without hydronephrosis: Secondary | ICD-10-CM | POA: Insufficient documentation

## 2010-10-27 DIAGNOSIS — Z01812 Encounter for preprocedural laboratory examination: Secondary | ICD-10-CM | POA: Insufficient documentation

## 2010-11-01 NOTE — Op Note (Signed)
NAMEWILLIAMS, STROMGREN NO.:  [id number]  MEDICAL RECORD NO.:  [id number]  LOCATION:                                 FACILITY:  PHYSICIAN:  Bernestine Amass, MD    DATE OF BIRTH:  1960-08-21  DATE OF PROCEDURE:  10/27/2010 DATE OF DISCHARGE:                              OPERATIVE REPORT   PREOPERATIVE DIAGNOSES: 1. Bilateral hydronephrosis. 2. Urethral stricture disease. 3. Elevated postvoid residual/neurogenic bladder. 4. Retained double-J stent.  POSTOPERATIVE DIAGNOSES: 1. Bilateral hydronephrosis. 2. Urethral stricture disease. 3. Elevated postvoid residual/neurogenic bladder. 4. Retained double-J stent.  PROCEDURE PERFORMED:  Cystoscopy, balloon dilation of urethral stricture, ureteroscopy with removal of left double-J stent x2.  SURGEON:  Bernestine Amass, MD  ANESTHESIA:  General.  INDICATIONS:  Frank Levine is currently 50 years of age.  He has had a complicated history.  He initially presented with marked bladder distention secondary to urethral stricture disease plus or minus component of detrusor wall thickening due to longstanding obstruction. The patient was noted at that time to have renal insufficiency and had markedly tortuous ureters.  He underwent dilation of urethral stricture with placement of a catheter and placement of double-J stents. Subsequently, the right double-J stent was removed.  On the left side, the stent had migrated and due to the tortuosity of the ureter, initial attempts to remove that stent were unsuccessful.  A second double-J stent was placed in the left side.  Patient's renal function has normalized. He is without clinical complaints.  He presents now for attempt at removal of his left-sided double-J stents with careful monitoring of his overall renal function.  TECHNIQUE AND FINDINGS:  The patient was brought to the operating room where he had successful induction of general anesthesia.  The patient had placement of  PAS compression boots and received perioperative antibiotics.  He was prepped and draped in the usual manner.  An appropriate surgical time-out was performed.  Cystoscopy again revealed recurrent bulbar urethral stricture disease.  With fluoroscopic guidance, a guidewire was placed to the bladder.  The urethral stricture was dilated with a 24-French nephrostomy dilating balloon for 5 minutes. At the completion of that, hemostasis was excellent and cystoscopy with the 22-French cystoscope occurred without incident.  The second double-J stent on the left side was seen within the bladder and was partially extracted.  A guidewire was placed up that stent to the renal pelvis. Again, there was marked tortuosity at the ureter, but improvement status post a second stent placement.  The initial stent was still seen in the mid ureter having migrated substantially in a proximal direction. Alongside that guidewire, we were able to place a rigid ureteroscope. Once again to the mid ureter, the stent was identified.  With some slow grasping of the stent, we were able to pull the stent out into the bladder where it was then extracted without difficulty.  A Foley catheter was placed and the urine was light pink in color.  The patient had no obvious complications and was brought to recovery room in stable condition.  Given his prior history of DVT, he will start Lovenox tomorrow morning, status post his procedure  and will have his catheter removed in approximately 48 hours.     Bernestine Amass, MD     DSG/MEDQ  D:  10/28/2010  T:  10/29/2010  Job:  [id number]  cc:   Annia Belt, M.D., F.A.C.P. [phone]  Electronically Signed by Rana Snare M.D. on 11/01/2010 11:57:47 AM

## 2010-11-23 ENCOUNTER — Other Ambulatory Visit: Payer: Self-pay | Admitting: Oncology

## 2010-11-23 ENCOUNTER — Encounter (HOSPITAL_BASED_OUTPATIENT_CLINIC_OR_DEPARTMENT_OTHER): Payer: Medicare Other | Admitting: Oncology

## 2010-11-23 DIAGNOSIS — D72819 Decreased white blood cell count, unspecified: Secondary | ICD-10-CM

## 2010-11-23 DIAGNOSIS — Z86718 Personal history of other venous thrombosis and embolism: Secondary | ICD-10-CM

## 2010-11-23 DIAGNOSIS — Q909 Down syndrome, unspecified: Secondary | ICD-10-CM

## 2010-11-23 DIAGNOSIS — I824Z9 Acute embolism and thrombosis of unspecified deep veins of unspecified distal lower extremity: Secondary | ICD-10-CM

## 2010-11-23 DIAGNOSIS — D509 Iron deficiency anemia, unspecified: Secondary | ICD-10-CM

## 2010-11-23 DIAGNOSIS — I2699 Other pulmonary embolism without acute cor pulmonale: Secondary | ICD-10-CM

## 2010-11-23 DIAGNOSIS — Z86711 Personal history of pulmonary embolism: Secondary | ICD-10-CM

## 2010-11-23 LAB — CBC WITH DIFFERENTIAL/PLATELET
EOS%: 2.5 % (ref 0.0–7.0)
Eosinophils Absolute: 0.1 10*3/uL (ref 0.0–0.5)
LYMPH%: 20.3 % (ref 14.0–49.0)
MCH: 33.2 pg (ref 27.2–33.4)
MCHC: 34.7 g/dL (ref 32.0–36.0)
MCV: 95.6 fL (ref 79.3–98.0)
MONO%: 9.4 % (ref 0.0–14.0)
NEUT#: 2.4 10*3/uL (ref 1.5–6.5)
Platelets: 188 10*3/uL (ref 140–400)
RBC: 4.62 10*6/uL (ref 4.20–5.82)

## 2010-11-23 LAB — D-DIMER, QUANTITATIVE: D-Dimer, Quant: 0.41 ug/mL-FEU (ref 0.00–0.48)

## 2011-04-14 DIAGNOSIS — E785 Hyperlipidemia, unspecified: Secondary | ICD-10-CM | POA: Diagnosis not present

## 2011-04-14 DIAGNOSIS — D649 Anemia, unspecified: Secondary | ICD-10-CM | POA: Diagnosis not present

## 2011-04-15 DIAGNOSIS — E785 Hyperlipidemia, unspecified: Secondary | ICD-10-CM | POA: Diagnosis not present

## 2011-04-15 DIAGNOSIS — D649 Anemia, unspecified: Secondary | ICD-10-CM | POA: Diagnosis not present

## 2011-05-02 ENCOUNTER — Telehealth: Payer: Self-pay | Admitting: *Deleted

## 2011-05-02 NOTE — Telephone Encounter (Signed)
left voice message to inform the patient of the date and time of the appointment

## 2011-06-14 DIAGNOSIS — N1330 Unspecified hydronephrosis: Secondary | ICD-10-CM | POA: Diagnosis not present

## 2011-06-14 DIAGNOSIS — N3020 Other chronic cystitis without hematuria: Secondary | ICD-10-CM | POA: Diagnosis not present

## 2011-06-14 DIAGNOSIS — N35919 Unspecified urethral stricture, male, unspecified site: Secondary | ICD-10-CM | POA: Diagnosis not present

## 2011-06-20 DIAGNOSIS — M25559 Pain in unspecified hip: Secondary | ICD-10-CM | POA: Diagnosis not present

## 2011-06-22 ENCOUNTER — Other Ambulatory Visit: Payer: Self-pay | Admitting: Family Medicine

## 2011-06-22 ENCOUNTER — Ambulatory Visit
Admission: RE | Admit: 2011-06-22 | Discharge: 2011-06-22 | Disposition: A | Discharge status: Home / Self Care | Payer: Medicare Other | Source: Ambulatory Visit | Attending: Family Medicine | Admitting: Family Medicine

## 2011-06-22 DIAGNOSIS — M25551 Pain in right hip: Secondary | ICD-10-CM

## 2011-06-22 DIAGNOSIS — M25459 Effusion, unspecified hip: Secondary | ICD-10-CM | POA: Diagnosis not present

## 2011-06-22 DIAGNOSIS — M25559 Pain in unspecified hip: Secondary | ICD-10-CM | POA: Diagnosis not present

## 2011-07-07 ENCOUNTER — Telehealth: Payer: Self-pay | Admitting: Oncology

## 2011-07-07 NOTE — Telephone Encounter (Signed)
Pt called and wants appt for 5/6 moved to a different date, pt will see MD in July, 2013, informed Janifer Adie , md's nurse

## 2011-07-11 ENCOUNTER — Ambulatory Visit: Payer: Medicare Other | Admitting: Oncology

## 2011-07-11 ENCOUNTER — Other Ambulatory Visit: Payer: Medicare Other | Admitting: Lab

## 2011-08-20 ENCOUNTER — Emergency Department (HOSPITAL_COMMUNITY): Payer: Medicare Other

## 2011-08-20 ENCOUNTER — Encounter (HOSPITAL_COMMUNITY): Payer: Self-pay | Admitting: *Deleted

## 2011-08-20 ENCOUNTER — Emergency Department (HOSPITAL_COMMUNITY)
Admission: EM | Admit: 2011-08-20 | Discharge: 2011-08-20 | Disposition: A | Discharge status: Home / Self Care | Payer: Medicare Other | Attending: Emergency Medicine | Admitting: Emergency Medicine

## 2011-08-20 DIAGNOSIS — R791 Abnormal coagulation profile: Secondary | ICD-10-CM | POA: Diagnosis not present

## 2011-08-20 DIAGNOSIS — Z86711 Personal history of pulmonary embolism: Secondary | ICD-10-CM | POA: Insufficient documentation

## 2011-08-20 DIAGNOSIS — R079 Chest pain, unspecified: Secondary | ICD-10-CM | POA: Diagnosis not present

## 2011-08-20 DIAGNOSIS — R0789 Other chest pain: Secondary | ICD-10-CM | POA: Diagnosis not present

## 2011-08-20 DIAGNOSIS — R918 Other nonspecific abnormal finding of lung field: Secondary | ICD-10-CM | POA: Diagnosis not present

## 2011-08-20 DIAGNOSIS — E785 Hyperlipidemia, unspecified: Secondary | ICD-10-CM | POA: Insufficient documentation

## 2011-08-20 HISTORY — DX: Hyperlipidemia, unspecified: E78.5

## 2011-08-20 LAB — BASIC METABOLIC PANEL
BUN: 10 mg/dL (ref 6–23)
Chloride: 101 mEq/L (ref 96–112)
GFR calc non Af Amer: 78 mL/min — ABNORMAL LOW (ref >90)
Glucose, Bld: 111 mg/dL — ABNORMAL HIGH (ref 70–99)
Potassium: 3.8 mEq/L (ref 3.5–5.1)
Sodium: 139 mEq/L (ref 135–145)

## 2011-08-20 LAB — CBC
Hemoglobin: 15.8 g/dL (ref 13.0–17.0)
MCH: 35.3 pg — ABNORMAL HIGH (ref 26.0–34.0)
Platelets: 195 10*3/uL (ref 150–400)
RBC: 4.47 MIL/uL (ref 4.22–5.81)
WBC: 5.2 10*3/uL (ref 4.0–10.5)

## 2011-08-20 LAB — DIFFERENTIAL
Eosinophils Absolute: 0.1 10*3/uL (ref 0.0–0.7)
Lymphocytes Relative: 16 % (ref 12–46)
Lymphs Abs: 0.8 10*3/uL (ref 0.7–4.0)
Monocytes Relative: 10 % (ref 3–12)
Neutro Abs: 3.6 10*3/uL (ref 1.7–7.7)
Neutrophils Relative %: 70 % (ref 43–77)

## 2011-08-20 LAB — POCT I-STAT TROPONIN I: Troponin i, poc: 0 ng/mL (ref 0.00–0.08)

## 2011-08-20 MED ORDER — FAMOTIDINE 20 MG PO TABS: 20.0000 mg | ORAL_TABLET | Freq: Two times a day (BID) | ORAL | Status: DC

## 2011-08-20 MED ORDER — IOHEXOL 350 MG/ML SOLN
100.0000 mL | Freq: Once | INTRAVENOUS | Status: AC | PRN
  Administered 2011-08-20: 100 mL via INTRAVENOUS

## 2011-08-20 NOTE — Discharge Instructions (Signed)
Your caregiver has diagnosed you as having chest pain that is nonspecific for one problem. This means that after looking at you and examining you and ordering tests (such as blood work, chest x-rays and EKG), your caregiver does not believe that the problem is serious enough to need watching in the hospital. This judgment is often made after testing shows no acute heart attack and you are at low risk for sudden acute heart condition. Chest pain comes from many different causes.  Seek immediate medical attention if:   You have severe chest pain, especially if the pain is crushing or pressure-like and spreads to the arms, back, neck, or jaw, or if you have sweating, nausea, shortness of breath. This is an emergency. Don't wait to see if the pain will go away. Get medical help at once. Call 911 immediately. Do not drive herself to the hospital.   Your chest pain gets worse and does not go away with rest.   You have an attack of chest pain lasting longer than usual, despite rest and treatment with the medications your caregiver has prescribed   You awaken from sleep with chest pain or shortness of breath.   You feel faint or dizzy   You have chest pain not typical of your usual pain for which you originally saw your caregiver.  You must have a repeat evaluation within 24 hours for a recheck of your heart.  Please call your doctor this morning to schedule this appointment. If you do not have a family doctor, please see the list of doctors below.  RESOURCE GUIDE  Dental Problems  Patients with Medicaid: [facility]                     [facility] [address]                                           [address] Phone:  [phone]                                                  Phone:  [phone]  If unable to pay or uninsured, contact:  [facility] or [facility] to become qualified for the adult dental clinic.  Chronic Pain  Problems Contact [facility]  [phone] Patients need to be referred by their primary care doctor.  Insufficient Money for Medicine Contact [employer]:  call "211" or [facility] 271-5999.  No Primary Care Doctor Call [facility]  832-8000 Other agencies that provide inexpensive medical care    [facility]  [phone]    [facility]  [phone]    [facility]  [phone]    [facility] Clinic  [phone]    [facility]  [phone]    [facility]  [phone]  Psychological Services [facility]  [phone] [facility]  [phone] [facility]   [phone] (emergency services 641-4993)  Substance Abuse Resources [facility]  336-882-2125 [facility] 336-784-9470 [facility] 336-285-9073 [facility] 336-845-3988 Residential & Outpatient Substance Abuse Program  800-659-3381  Abuse/Neglect [place] Child Abuse Hotline (336) 641-3795 [place] [facility] Abuse Hotline 800-378-5315 (After Hours)  Emergency Shelter [facility] (336) 271-5985  [facility] (336) 275-9566 [facility] (704) 372-4663  MRSA Hotline #:   832-7006    [place] [facility]  [facility] [place]     [employer]                          [facility] [address] [place]                       [address]      [address]  [place]                                                [place]                            [place] Phone:  [phone]                                   Phone:  [phone]                 Phone:  [phone]  [facility] Phone:  [phone]  [place] Child Abuse Hotline (336) 342-1394 (336) 342-3537 (After Hours)    

## 2011-08-20 NOTE — ED Notes (Addendum)
Chest pains going on for the last 4 days. Mother states pt woke during the night couple a days ago w/ chest pain tx w/ tums & pt went back to sleep. Happened again tonight only earlier. Pt was given 2 tums prior to coming to the er.

## 2011-08-20 NOTE — ED Notes (Signed)
Chest pain x 4 days 

## 2011-08-20 NOTE — ED Notes (Signed)
Pt alert & oriented x4, stable gait. Pt given discharge instructions, paperwork & prescription(s).  pt verbalized understanding. Pt left department w/ no further questions.  

## 2011-08-20 NOTE — ED Provider Notes (Signed)
History     CSN: [id number]  Arrival date & time 08/20/11  0015   First MD Initiated Contact with Patient 08/20/11 0048      Chief Complaint  Patient presents with  . Chest Pain    (Consider location/radiation/quality/duration/timing/severity/associated sxs/prior treatment) HPI Comments: 51 year old male with a history of hyperlipidemia, pulmonary embolism after surgery and bladder exstrophy. He presents with a complaint of intermittent chest pain which has been ongoing for the last 4 days. He feels it mostly at night, is poorly described but states that it feels like a soreness, nothing makes it better or worse, located in the mid chest without radiation, no associated shortness of breath, fevers, vomiting, coughing, swelling of the lower extremities. He was taken off Coumadin in the last couple of years after a urologic procedure and has not gone back on it since that time.  Patient is a 51 y.o. male presenting with chest pain. The history is provided by the patient, a relative and medical records.  Chest Pain     Past Medical History  Diagnosis Date  . Bladder extrophy   . Blood clot in vein   . Hyperlipemia     Past Surgical History  Procedure Date  . Bladder surgery     No family history on file.  History  Substance Use Topics  . Smoking status: Never Smoker   . Smokeless tobacco: Not on file  . Alcohol Use: No      Review of Systems  Cardiovascular: Positive for chest pain.  All other systems reviewed and are negative.    Allergies  Review of patient's allergies indicates no known allergies.  Home Medications   Current Outpatient Rx  Name Route Sig Dispense Refill  . ASPIRIN 81 MG PO TABS Oral Take 81 mg by mouth daily.    Marland Kitchen DOCUSATE SODIUM 100 MG PO CAPS Oral Take 100 mg by mouth 2 (two) times daily.    Marland Kitchen FERROUS SULFATE 325 (65 FE) MG PO TABS Oral Take 325 mg by mouth daily with breakfast.    . SIMVASTATIN 40 MG PO TABS Oral Take 40 mg by mouth  every evening.    Marland Kitchen FAMOTIDINE 20 MG PO TABS Oral Take 1 tablet (20 mg total) by mouth 2 (two) times daily. 30 tablet 0    BP 105/79  Pulse 87  Temp 98.1 F (36.7 C) (Oral)  Resp 18  Ht 5\' 4"  (1.626 m)  Wt 184 lb (83.462 kg)  BMI 31.58 kg/m2  SpO2 96%  Physical Exam  Nursing note and vitals reviewed. Constitutional: He appears well-developed and well-nourished. No distress.  HENT:  Head: Normocephalic and atraumatic.  Mouth/Throat: Oropharynx is clear and moist. No oropharyngeal exudate.  Eyes: Conjunctivae and EOM are normal. Pupils are equal, round, and reactive to light. Right eye exhibits no discharge. Left eye exhibits no discharge. No scleral icterus.  Neck: Normal range of motion. Neck supple. No JVD present. No thyromegaly present.  Cardiovascular: Normal rate, regular rhythm, normal heart sounds and intact distal pulses.  Exam reveals no gallop and no friction rub.   No murmur heard. Pulmonary/Chest: Effort normal and breath sounds normal. No respiratory distress. He has no wheezes. He has no rales. He exhibits tenderness ( Tender to palpation in the left parasternal area, patient states is similar to his pain).  Abdominal: Soft. Bowel sounds are normal. He exhibits no distension and no mass. There is no tenderness.  Musculoskeletal: Normal range of motion. He exhibits no edema  and no tenderness.  Lymphadenopathy:    He has no cervical adenopathy.  Neurological: He is alert. Coordination normal.  Skin: Skin is warm and dry. No rash noted. No erythema.  Psychiatric: He has a normal mood and affect. His behavior is normal.    ED Course  Procedures (including critical care time)  Labs Reviewed  CBC - Abnormal; Notable for the following:    MCH 35.3 (*)     All other components within normal limits  BASIC METABOLIC PANEL - Abnormal; Notable for the following:    Glucose, Bld 111 (*)     GFR calc non Af Amer 78 (*)     All other components within normal limits    D-DIMER, QUANTITATIVE - Abnormal; Notable for the following:    D-Dimer, Quant 1.00 (*)     All other components within normal limits  DIFFERENTIAL  POCT I-STAT TROPONIN I   Ct Angio Chest W/cm &/or Wo Cm  08/20/2011  *RADIOLOGY REPORT*  Clinical Data: Chest pain for 4 days; history of pulmonary embolus. Elevated D-dimer.  CT ANGIOGRAPHY CHEST  Technique:  Multidetector CT imaging of the chest using the standard protocol during bolus administration of intravenous contrast. Multiplanar reconstructed images including MIPs were obtained and reviewed to evaluate the vascular anatomy.  Contrast: 150mL OMNIPAQUE IOHEXOL 350 MG/ML SOLN  Comparison: Chest radiograph performed earlier today at 12:45 a.m., and CTA of the chest performed 02/04/2010  Findings: There is no evidence of pulmonary embolus.  There is a mosaic pattern of attenuation within the lung parenchyma, nonspecific in appearance.  No definite focal airspace consolidation is seen.  There is no evidence of pleural effusion or pneumothorax.  No masses are identified; no abnormal focal contrast enhancement is seen.  Trace pericardial fluid remains within normal limits.  The mediastinum is unremarkable in appearance.  No mediastinal lymphadenopathy is seen.  The great vessels are unremarkable in appearance.  No axillary lymphadenopathy is seen.  The visualized portions of the thyroid gland are unremarkable in appearance.  The visualized portions of the liver and spleen are unremarkable. There is persistent massive dilatation of the visualized right renal pelvis and calyces.  No acute osseous abnormalities are seen.  IMPRESSION:  1.  No evidence of pulmonary embolus. 2.  Mosaic pattern of attenuation within the lung parenchyma, nonspecific in appearance. 3.  Persistent massive dilatation of the visualized right renal pelvis and calyces, unchanged from 2011.  Original Report Authenticated By: Santa Lighter, M.D.   Dg Chest Portable 1 View  08/20/2011   *RADIOLOGY REPORT*  Clinical Data: Chest pain and epigastric pain; history of Down syndrome.  PORTABLE CHEST - 1 VIEW  Comparison: Chest radiograph performed 07/30/2010  Findings: The lungs are well-aerated.  Minimal bibasilar opacities may reflect atelectasis or scarring.  There is no evidence of pleural effusion or pneumothorax.  The cardiomediastinal silhouette is within normal limits.  No acute osseous abnormalities are seen.  IMPRESSION: Minimal bibasilar opacities may reflect atelectasis or scarring; lungs otherwise clear.  Original Report Authenticated By: Santa Lighter, M.D.     1. Chest pain       MDM  There is no peripheral edema, tachycardia, fever or significant hypoxia to suggest recurrent pulmonary embolism. His oxygen levels range from 93-98% on room air, his EKG is unchanged from prior EKGs and overall the patient appears well with some reproducible chest pain on palpation. Check laboratories including d-dimer, troponin, basic metabolic.  ED ECG REPORT  I have personally interpreted the EKG,  see below   Date: 08/20/2011   Rate: 91  Rhythm: normal sinus rhythm  QRS Axis: normal  Intervals: normal  ST/T Wave abnormalities: normal  Conduction Disutrbances:none  Narrative Interpretation:   Old EKG Reviewed: Compared with 01/27/2010, no significant changes seen   Patient reevaluated several times during his stay, has had no pain, sleeping without difficulty. D-dimer was elevated prompting a CT angiogram scan of the chest which did not show any signs of pulmonary embolism.  The CT scan also did not show any signs of infection. The patient is known to have urinary tract abnormalities, this has been seen from CT scans from 2011. Otherwise the lab work showed normal basic metabolic panel, normal CBC. EKG was normal, the pattern of his pain coming only at night and especially when he lays down makes this much less likely to be an acute coronary syndrome specially with a normal i-STAT  troponin here.Marland Kitchen EKG was normal, patient to be discharged home on Pepcid and followup with primary Dr.  Discharge Prescriptions include:  pepcid       Johnna Acosta, MD 08/20/11 [phone]

## 2011-08-20 NOTE — ED Notes (Signed)
Patient transported to CT 

## 2011-09-27 ENCOUNTER — Other Ambulatory Visit (HOSPITAL_BASED_OUTPATIENT_CLINIC_OR_DEPARTMENT_OTHER): Payer: Medicare Other | Admitting: Lab

## 2011-09-27 ENCOUNTER — Ambulatory Visit (HOSPITAL_BASED_OUTPATIENT_CLINIC_OR_DEPARTMENT_OTHER): Payer: Medicare Other | Admitting: Oncology

## 2011-09-27 ENCOUNTER — Encounter: Payer: Self-pay | Admitting: Oncology

## 2011-09-27 VITALS — BP 101/68 | HR 70 | Temp 97.1°F | Ht 64.0 in | Wt 180.7 lb

## 2011-09-27 DIAGNOSIS — Q909 Down syndrome, unspecified: Secondary | ICD-10-CM | POA: Diagnosis not present

## 2011-09-27 DIAGNOSIS — I824Z9 Acute embolism and thrombosis of unspecified deep veins of unspecified distal lower extremity: Secondary | ICD-10-CM | POA: Diagnosis not present

## 2011-09-27 DIAGNOSIS — D72819 Decreased white blood cell count, unspecified: Secondary | ICD-10-CM | POA: Insufficient documentation

## 2011-09-27 DIAGNOSIS — D509 Iron deficiency anemia, unspecified: Secondary | ICD-10-CM

## 2011-09-27 DIAGNOSIS — Z86718 Personal history of other venous thrombosis and embolism: Secondary | ICD-10-CM | POA: Diagnosis not present

## 2011-09-27 DIAGNOSIS — I2699 Other pulmonary embolism without acute cor pulmonale: Secondary | ICD-10-CM | POA: Diagnosis not present

## 2011-09-27 DIAGNOSIS — Z86711 Personal history of pulmonary embolism: Secondary | ICD-10-CM | POA: Diagnosis not present

## 2011-09-27 DIAGNOSIS — N35919 Unspecified urethral stricture, male, unspecified site: Secondary | ICD-10-CM | POA: Insufficient documentation

## 2011-09-27 HISTORY — DX: Down syndrome, unspecified: Q90.9

## 2011-09-27 LAB — CBC WITH DIFFERENTIAL/PLATELET
BASO%: 0.9 % (ref 0.0–2.0)
EOS%: 3.2 % (ref 0.0–7.0)
HGB: 14.7 g/dL (ref 13.0–17.1)
MCH: 35.3 pg — ABNORMAL HIGH (ref 27.2–33.4)
MCHC: 34.4 g/dL (ref 32.0–36.0)
MONO#: 0.6 10*3/uL (ref 0.1–0.9)
RDW: 13.1 % (ref 11.0–14.6)
WBC: 5.8 10*3/uL (ref 4.0–10.3)
lymph#: 0.9 10*3/uL (ref 0.9–3.3)

## 2011-09-27 LAB — MORPHOLOGY: PLT EST: ADEQUATE

## 2011-09-27 NOTE — Progress Notes (Signed)
Hematology and Oncology Follow Up Visit  Frank Levine [id number] May 08, 1960 51 y.o. 09/27/2011 12:53 PM   Principle Diagnosis: Encounter Diagnoses  Name Primary?  . History of pulmonary embolus (PE) Yes  . History of DVT of lower extremity   . Leukopenia      Interim History:   followup visit for this 51 year old man referred here back in July, 2012 for advice on duration of anticoagulation.  He is status post bilateral pulmonary emboli and an extensive left lower extremity DVT in December 2011.  He has urologic problems and had required a number of GU manipulations.  His emboli occurred within a week of a urologic manipulation.  He had subsequent problems with gross hematuria while on therapeutic Coumadin, again related to urologic procedures.  At the time that I saw him, he had been on anticoagulation for over 6 months.  I did not think that he needed to continue anymore under the circumstances.  A D-dimer was still moderately elevated at 1.26 (normal less than 0.48) on 09/15/2010 but subsequently normal at time of a 11/23/2010 visit at 0.41. Value done in anticipation of today's visit is up again and 1.0 on 08/20/2011 of questionable significance.     Another problem identified at time of initial visit was an unexplained to low white blood count with total white count 2600 but normal differential with 59 neutrophils, 25 lymphocytes, 10 monocytes.  Normal platelet count of 220,000. Followup CBCs have shown normalization of his white count which is 5800 today with 71% neutrophils, 15% lymphocytes, 10% monocytes and 3% eosinophils with a hemoglobin of 14.7, MCV of 103, and platelet count 223,000.  He has had no interim medical problems. No recurrent episodes of hematuria. He denies any dyspnea, chest pain, leg pain or swelling. His mother accompanies him today and confirms his history.  Medications: reviewed  Allergies: No Known Allergies  Review of Systems: Constitutional:  No  constitutional symptoms  Respiratory: See above Cardiovascular:  No chest pain or palpitations Gastrointestinal: No change in bowel habit Genito-Urinary: See above Musculoskeletal: Not questioned Neurologic: No headache or change in vision Skin: No rash or ecchymosis Remaining ROS negative.  Physical Exam: Blood pressure 101/68, pulse 70, temperature 97.1 F (36.2 C), temperature source Oral, height 5\' 4"  (1.626 m), weight 180 lb 11.2 oz (81.965 kg). Wt Readings from Last 3 Encounters:  09/27/11 180 lb 11.2 oz (81.965 kg)  08/20/11 184 lb (83.462 kg)     General appearance: Pleasant Caucasian man with Down syndrome HENNT: Pharynx no erythema or exudate Lymph nodes: No adenopathy Breasts: Lungs: Clear to auscultation resonant to percussion Heart: Regular rhythm no murmur Abdomen: Soft nontender no mass no organomegaly Extremities: No edema no calf tenderness Vascular: No cyanosis Neurologic: No focal deficit Skin: No rash or ecchymosis  Lab Results: Lab Results  Component Value Date   WBC 5.8 09/27/2011   HGB 14.7 09/27/2011   HCT 42.7 09/27/2011   MCV 102.7* 09/27/2011   PLT 223 09/27/2011     Chemistry      Component Value Date/Time   NA 139 08/20/2011 0038   K 3.8 08/20/2011 0038   CL 101 08/20/2011 0038   CO2 27 08/20/2011 0038   BUN 10 08/20/2011 0038   CREATININE 1.07 08/20/2011 0038      Component Value Date/Time   CALCIUM 9.4 08/20/2011 0038       Radiological Studies: No results found.  Impression and Plan: #1. Status post acute pulmonary emboli and left lower extremity  DVT December 2011 which occurred perioperatively after a urologic procedure. He was anticoagulated for 6 months. He remains asymptomatic off anticoagulation except for one aspirin daily at this time.  #2. Transient leukopenia with normal white count differential which has resolved.  #3. Macrocytosis without anemia  #4. History of chronic urethral stricture and thickening of the bladder wall  resulting in urinary retention, hydronephrosis, hydroureter, and requiring a previous and GU manipulations. No recent interventions.  I told him he did not does need to see me anymore on a routine basis but I would be happy to reevaluate him if any hematology problems arise in the future.   CC:. Dr. Redge Gainer; Dr. Rana Snare   Annia Belt, MD 7/23/201312:53 PM

## 2011-09-30 ENCOUNTER — Emergency Department (HOSPITAL_COMMUNITY): Payer: Medicare Other

## 2011-09-30 ENCOUNTER — Encounter (HOSPITAL_COMMUNITY): Payer: Self-pay | Admitting: Emergency Medicine

## 2011-09-30 ENCOUNTER — Emergency Department (HOSPITAL_COMMUNITY)
Admission: EM | Admit: 2011-09-30 | Discharge: 2011-10-01 | Disposition: A | Discharge status: Home / Self Care | Payer: Medicare Other | Attending: Emergency Medicine | Admitting: Emergency Medicine

## 2011-09-30 DIAGNOSIS — E785 Hyperlipidemia, unspecified: Secondary | ICD-10-CM | POA: Insufficient documentation

## 2011-09-30 DIAGNOSIS — R1011 Right upper quadrant pain: Secondary | ICD-10-CM | POA: Insufficient documentation

## 2011-09-30 DIAGNOSIS — N390 Urinary tract infection, site not specified: Secondary | ICD-10-CM | POA: Diagnosis not present

## 2011-09-30 DIAGNOSIS — Q909 Down syndrome, unspecified: Secondary | ICD-10-CM | POA: Insufficient documentation

## 2011-09-30 DIAGNOSIS — Z86718 Personal history of other venous thrombosis and embolism: Secondary | ICD-10-CM | POA: Diagnosis not present

## 2011-09-30 DIAGNOSIS — Z86711 Personal history of pulmonary embolism: Secondary | ICD-10-CM | POA: Insufficient documentation

## 2011-09-30 DIAGNOSIS — R1110 Vomiting, unspecified: Secondary | ICD-10-CM | POA: Diagnosis not present

## 2011-09-30 DIAGNOSIS — N1330 Unspecified hydronephrosis: Secondary | ICD-10-CM | POA: Diagnosis not present

## 2011-09-30 LAB — CBC WITH DIFFERENTIAL/PLATELET
Basophils Absolute: 0 10*3/uL (ref 0.0–0.1)
Basophils Relative: 0 % (ref 0–1)
Eosinophils Absolute: 0.1 10*3/uL (ref 0.0–0.7)
MCH: 35.8 pg — ABNORMAL HIGH (ref 26.0–34.0)
MCHC: 36.2 g/dL — ABNORMAL HIGH (ref 30.0–36.0)
Neutrophils Relative %: 86 % — ABNORMAL HIGH (ref 43–77)
Platelets: 240 10*3/uL (ref 150–400)
RDW: 12.8 % (ref 11.5–15.5)

## 2011-09-30 LAB — URINALYSIS, ROUTINE W REFLEX MICROSCOPIC
Glucose, UA: NEGATIVE mg/dL
Ketones, ur: NEGATIVE mg/dL
pH: 6.5 (ref 5.0–8.0)

## 2011-09-30 LAB — COMPREHENSIVE METABOLIC PANEL
ALT: 16 U/L (ref 0–53)
AST: 22 U/L (ref 0–37)
Albumin: 3.4 g/dL — ABNORMAL LOW (ref 3.5–5.2)
Alkaline Phosphatase: 80 U/L (ref 39–117)
BUN: 10 mg/dL (ref 6–23)
Potassium: 3.8 mEq/L (ref 3.5–5.1)
Sodium: 140 mEq/L (ref 135–145)
Total Protein: 7.3 g/dL (ref 6.0–8.3)

## 2011-09-30 LAB — URINE MICROSCOPIC-ADD ON

## 2011-09-30 MED ORDER — SODIUM CHLORIDE 0.9 % IV SOLN
INTRAVENOUS | Status: DC
  Administered 2011-09-30: 125 mL/h via INTRAVENOUS

## 2011-09-30 MED ORDER — MORPHINE SULFATE 4 MG/ML IJ SOLN
2.0000 mg | Freq: Once | INTRAMUSCULAR | Status: AC
  Administered 2011-09-30: 2 mg via INTRAVENOUS
  Filled 2011-09-30: qty 1

## 2011-09-30 NOTE — ED Notes (Signed)
Patient complaining of right flank pain, nausea, and vomiting that started at approximately 1900 tonight.

## 2011-09-30 NOTE — ED Notes (Signed)
Sudden onset of  pain right hip or right flank area and then nausea with "dry heaves"  Caregiver gave him a phenergan suppository 25mg  around 7:30pm   Nausea is better but patient continues to have pain.  Can move all extremeties.

## 2011-10-01 MED ORDER — HYDROCODONE-ACETAMINOPHEN 5-325 MG PO TABS: 1.0000 | ORAL_TABLET | ORAL | Status: AC | PRN

## 2011-10-01 MED ORDER — CIPROFLOXACIN HCL 250 MG PO TABS
500.0000 mg | ORAL_TABLET | Freq: Once | ORAL | Status: AC
  Administered 2011-10-01: 500 mg via ORAL
  Filled 2011-10-01: qty 2

## 2011-10-01 MED ORDER — CIPROFLOXACIN HCL 500 MG PO TABS: 500.0000 mg | ORAL_TABLET | Freq: Two times a day (BID) | ORAL | Status: AC

## 2011-10-01 NOTE — ED Notes (Signed)
Home with caregiver

## 2011-10-02 LAB — URINE CULTURE

## 2011-10-02 NOTE — ED Provider Notes (Signed)
Medical screening examination/treatment/procedure(s) were performed by non-physician practitioner and as supervising physician I was immediately available for consultation/collaboration.  Veryl Speak, MD 10/02/11 Joen Laura

## 2011-10-02 NOTE — ED Provider Notes (Signed)
History     CSN: [id number]  Arrival date & time 09/30/11  2104   First MD Initiated Contact with Patient 09/30/11 2133      Chief Complaint  Patient presents with  . Emesis  . Flank Pain    (Consider location/radiation/quality/duration/timing/severity/associated sxs/prior treatment) HPI Comments: SPARSH KEAST presents with complaint of right sided flank and right lower quadrant pain which appeared rather suddenly around 6 pm tonight.  Patient has a history of Down's syndrome, and mother at the bedside reports he generally will not complain of pain until symptoms become severe,  Therefore the abruptness of onset of symptoms is questionable.  He has had nausea with dry heaves as well,  Without diarrhea,  Constipation,  Hematuria or fever. His last bm was this afternoon and normal.  He does not have a history of kidney stone,  But does have history of bladder exstrophy along with ureteral strictures and previous history of ureteral stents,  The last GU procedure 8/12 when he had his last left sided ureteral stent removed.  He is followed by Dr. Risa Grill.  Pt was given a phenergan suppository at 7:30 pm which has improved the nausea,  But pain persists.  Described as sharp and constant,  Not worsened with movement.  He denies painful urination or increased frequency.  Patient is a 51 y.o. male presenting with flank pain. The history is provided by the patient and a parent.  Flank Pain Associated symptoms include abdominal pain, nausea and vomiting. Pertinent negatives include no arthralgias, chest pain, congestion, fever, headaches, joint swelling, numbness, rash, sore throat or weakness.    Past Medical History  Diagnosis Date  . Bladder extrophy   . Blood clot in vein   . Hyperlipemia   . Pulmonary emboli 09/27/2011    December, 2011 1 week post GU manipulation  Anti-coagulated for 6 mos  . DVT, lower extremity, distal 09/27/2011    Occurred 1 week after GU manipulation Dec, 2011 w  associated PE  . Urethral stricture 09/27/2011  . Leukopenia 09/27/2011    WBC 2,600 normal diff 09/2010 - resolved spontaneously on follow up labs  . Down's syndrome 09/27/2011    Past Surgical History  Procedure Date  . Bladder surgery     History reviewed. No pertinent family history.  History  Substance Use Topics  . Smoking status: Never Smoker   . Smokeless tobacco: Not on file  . Alcohol Use: No      Review of Systems  Constitutional: Negative for fever.  HENT: Negative for congestion and sore throat.   Eyes: Negative.   Respiratory: Negative for chest tightness and shortness of breath.   Cardiovascular: Negative for chest pain.  Gastrointestinal: Positive for nausea, vomiting and abdominal pain. Negative for diarrhea, constipation and blood in stool.  Genitourinary: Positive for flank pain. Negative for urgency, frequency and difficulty urinating.  Musculoskeletal: Negative for joint swelling and arthralgias.  Skin: Negative.  Negative for rash and wound.  Neurological: Negative for dizziness, weakness, light-headedness, numbness and headaches.  Hematological: Negative.   Psychiatric/Behavioral: Negative.     Allergies  Review of patient's allergies indicates no known allergies.  Home Medications   Current Outpatient Rx  Name Route Sig Dispense Refill  . PROMETHAZINE HCL 25 MG RE SUPP Rectal Place 25 mg rectally every 6 (six) hours as needed. Had from a previous condition    . ASPIRIN 81 MG PO TABS Oral Take 81 mg by mouth daily.    Marland Kitchen CIPROFLOXACIN  HCL 500 MG PO TABS Oral Take 1 tablet (500 mg total) by mouth 2 (two) times daily. 14 tablet 0  . DOCUSATE SODIUM 100 MG PO CAPS Oral Take 100 mg by mouth daily.     Marland Kitchen FERROUS SULFATE 325 (65 FE) MG PO TABS Oral Take 325 mg by mouth daily with breakfast.    . HYDROCODONE-ACETAMINOPHEN 5-325 MG PO TABS Oral Take 1 tablet by mouth every 4 (four) hours as needed for pain. 10 tablet 0  . PANTOPRAZOLE SODIUM 40 MG PO TBEC  Oral Take 40 mg by mouth daily.    Marland Kitchen SIMVASTATIN 40 MG PO TABS Oral Take 40 mg by mouth every evening.      BP 116/77  Pulse 106  Temp 99.6 F (37.6 C) (Oral)  Resp 24  Ht 5\' 5"  (1.651 m)  Wt 180 lb (81.647 kg)  BMI 29.95 kg/m2  SpO2 100%  Physical Exam  Nursing note and vitals reviewed. Constitutional: He appears well-developed and well-nourished.  HENT:  Head: Normocephalic and atraumatic.  Eyes: Conjunctivae are normal.  Neck: Normal range of motion.  Cardiovascular: Normal rate, regular rhythm, normal heart sounds and intact distal pulses.   Pulmonary/Chest: Effort normal and breath sounds normal. He has no wheezes.  Abdominal: Soft. Bowel sounds are normal. He exhibits no distension and no mass. There is tenderness in the suprapubic area. There is no guarding and no CVA tenderness.  Musculoskeletal: Normal range of motion.  Neurological: He is alert.  Skin: Skin is warm and dry.  Psychiatric: He has a normal mood and affect.    ED Course  Procedures (including critical care time)  Labs Reviewed  CBC WITH DIFFERENTIAL - Abnormal; Notable for the following:    WBC 11.4 (*)     MCH 35.8 (*)     MCHC 36.2 (*)     Neutrophils Relative 86 (*)     Neutro Abs 9.8 (*)     Lymphocytes Relative 5 (*)     Lymphs Abs 0.6 (*)     All other components within normal limits  COMPREHENSIVE METABOLIC PANEL - Abnormal; Notable for the following:    Glucose, Bld 122 (*)     Albumin 3.4 (*)     GFR calc non Af Amer 69 (*)     GFR calc Af Amer 80 (*)     All other components within normal limits  URINALYSIS, ROUTINE W REFLEX MICROSCOPIC - Abnormal; Notable for the following:    APPearance CLOUDY (*)     Hgb urine dipstick LARGE (*)     Protein, ur 100 (*)     Leukocytes, UA LARGE (*)     All other components within normal limits  URINE MICROSCOPIC-ADD ON - Abnormal; Notable for the following:    Squamous Epithelial / LPF FEW (*)     Bacteria, UA MANY (*)     All other  components within normal limits  URINE CULTURE   Ct Abdomen Pelvis Wo Contrast  09/30/2011  *RADIOLOGY REPORT*  Clinical Data: Right flank pain  CT ABDOMEN AND PELVIS WITHOUT CONTRAST  Technique:  Multidetector CT imaging of the abdomen and pelvis was performed following the standard protocol without intravenous contrast.  Comparison: 08/01/2010  Findings: Severe right hydronephrosis is stable.  Severe left hydronephrosis is stable.  Bilateral renal cortical atrophy.  Left- sided ureteral stents have been removed.  There is dilatation of both ureters extending to the insertion of the bladder.  No evidence of renal calculus.  There is a prominent bladder diverticulum at the right bladder base at the insertion of the right ureter.  Diffuse mild wall bladder thickening is noted.  Unenhanced liver, gallbladder, spleen, pancreas, adrenal glands are within normal limits.  No free fluid.  Left inguinal hernia contains only adipose tissue.  No acute bony deformity.  IMPRESSION: Stable severe bilateral hydronephrosis as described.  Left ureteral stents have been removed.  Diffuse bladder wall thickening is improved compared the prior study.  Hematuria within the bladder previously seen has resolved.  Original Report Authenticated By: Jamas Lav, M.D.     1. Urinary tract infection     Pt given first cipro tab prior to dc home.    MDM  Labs and Ct scan reviewed.  Discussed case with Dr. Risa Grill who agreed with plan.  Advised that can see pt in office this week if sx not improved, otherwise,  Can keep his regularly scheduled appt (8/20 per mother).  Discussed with mother who is agreeable to plan. Cautioned to get rechecked asap for increased fever,  Increased pain or continued n/v.  Urine culture ordered.  Cipro prescription given.        Evalee Jefferson, [place] 10/02/11 1244

## 2011-10-11 DIAGNOSIS — B351 Tinea unguium: Secondary | ICD-10-CM | POA: Diagnosis not present

## 2011-10-11 DIAGNOSIS — R262 Difficulty in walking, not elsewhere classified: Secondary | ICD-10-CM | POA: Diagnosis not present

## 2011-10-13 DIAGNOSIS — N390 Urinary tract infection, site not specified: Secondary | ICD-10-CM | POA: Diagnosis not present

## 2011-10-13 DIAGNOSIS — N3090 Cystitis, unspecified without hematuria: Secondary | ICD-10-CM | POA: Diagnosis not present

## 2011-10-25 DIAGNOSIS — N3020 Other chronic cystitis without hematuria: Secondary | ICD-10-CM | POA: Diagnosis not present

## 2011-10-25 DIAGNOSIS — N35919 Unspecified urethral stricture, male, unspecified site: Secondary | ICD-10-CM | POA: Diagnosis not present

## 2011-10-25 DIAGNOSIS — N1330 Unspecified hydronephrosis: Secondary | ICD-10-CM | POA: Diagnosis not present

## 2011-10-25 DIAGNOSIS — N3941 Urge incontinence: Secondary | ICD-10-CM | POA: Diagnosis not present

## 2011-12-11 ENCOUNTER — Encounter (HOSPITAL_COMMUNITY): Payer: Self-pay

## 2011-12-11 ENCOUNTER — Emergency Department (HOSPITAL_COMMUNITY): Payer: Medicare Other

## 2011-12-11 ENCOUNTER — Emergency Department (HOSPITAL_COMMUNITY)
Admission: EM | Admit: 2011-12-11 | Discharge: 2011-12-11 | Disposition: A | Discharge status: Home / Self Care | Payer: Medicare Other | Attending: Emergency Medicine | Admitting: Emergency Medicine

## 2011-12-11 DIAGNOSIS — Z86711 Personal history of pulmonary embolism: Secondary | ICD-10-CM | POA: Insufficient documentation

## 2011-12-11 DIAGNOSIS — Q909 Down syndrome, unspecified: Secondary | ICD-10-CM | POA: Diagnosis not present

## 2011-12-11 DIAGNOSIS — Z86718 Personal history of other venous thrombosis and embolism: Secondary | ICD-10-CM | POA: Insufficient documentation

## 2011-12-11 DIAGNOSIS — S8000XA Contusion of unspecified knee, initial encounter: Secondary | ICD-10-CM | POA: Diagnosis not present

## 2011-12-11 DIAGNOSIS — S8990XA Unspecified injury of unspecified lower leg, initial encounter: Secondary | ICD-10-CM | POA: Diagnosis not present

## 2011-12-11 DIAGNOSIS — M25569 Pain in unspecified knee: Secondary | ICD-10-CM | POA: Diagnosis not present

## 2011-12-11 DIAGNOSIS — Z043 Encounter for examination and observation following other accident: Secondary | ICD-10-CM | POA: Diagnosis not present

## 2011-12-11 DIAGNOSIS — E785 Hyperlipidemia, unspecified: Secondary | ICD-10-CM | POA: Diagnosis not present

## 2011-12-11 DIAGNOSIS — S99919A Unspecified injury of unspecified ankle, initial encounter: Secondary | ICD-10-CM | POA: Diagnosis not present
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
## 2011-12-11 NOTE — ED Notes (Signed)
Pt had left and walked out to waiting room, called and instructed pt to return to ED to receive d/c instructions.
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
## 2012-01-03 DIAGNOSIS — I10 Essential (primary) hypertension: Secondary | ICD-10-CM | POA: Diagnosis not present

## 2012-01-03 DIAGNOSIS — E785 Hyperlipidemia, unspecified: Secondary | ICD-10-CM | POA: Diagnosis not present

## 2012-01-03 DIAGNOSIS — Q909 Down syndrome, unspecified: Secondary | ICD-10-CM | POA: Diagnosis not present

## 2012-01-03 DIAGNOSIS — Z23 Encounter for immunization: Secondary | ICD-10-CM | POA: Diagnosis not present

## 2012-01-03 DIAGNOSIS — R609 Edema, unspecified: Secondary | ICD-10-CM | POA: Diagnosis not present

## 2012-01-04 DIAGNOSIS — Z125 Encounter for screening for malignant neoplasm of prostate: Secondary | ICD-10-CM | POA: Diagnosis not present

## 2012-01-04 DIAGNOSIS — E785 Hyperlipidemia, unspecified: Secondary | ICD-10-CM | POA: Diagnosis not present

## 2012-01-04 DIAGNOSIS — E291 Testicular hypofunction: Secondary | ICD-10-CM | POA: Diagnosis not present

## 2012-01-13 ENCOUNTER — Emergency Department (HOSPITAL_COMMUNITY)
Admission: EM | Admit: 2012-01-13 | Discharge: 2012-01-13 | Disposition: A | Discharge status: Home / Self Care | Payer: Medicare Other | Attending: Emergency Medicine | Admitting: Emergency Medicine

## 2012-01-13 ENCOUNTER — Encounter (HOSPITAL_COMMUNITY): Payer: Self-pay | Admitting: *Deleted

## 2012-01-13 DIAGNOSIS — N390 Urinary tract infection, site not specified: Secondary | ICD-10-CM | POA: Insufficient documentation

## 2012-01-13 DIAGNOSIS — Z79899 Other long term (current) drug therapy: Secondary | ICD-10-CM | POA: Insufficient documentation

## 2012-01-13 LAB — URINALYSIS, ROUTINE W REFLEX MICROSCOPIC
Bilirubin Urine: NEGATIVE
Ketones, ur: NEGATIVE mg/dL
Protein, ur: 100 mg/dL — AB
Urobilinogen, UA: 0.2 mg/dL (ref 0.0–1.0)

## 2012-01-13 MED ORDER — PHENAZOPYRIDINE HCL 100 MG PO TABS
100.0000 mg | ORAL_TABLET | Freq: Once | ORAL | Status: AC
  Administered 2012-01-13: 100 mg via ORAL
  Filled 2012-01-13: qty 1

## 2012-01-13 MED ORDER — CIPROFLOXACIN HCL 250 MG PO TABS
500.0000 mg | ORAL_TABLET | Freq: Once | ORAL | Status: AC
  Administered 2012-01-13: 500 mg via ORAL
  Filled 2012-01-13: qty 2

## 2012-01-13 MED ORDER — CIPROFLOXACIN HCL 500 MG PO TABS: 500.0000 mg | ORAL_TABLET | Freq: Two times a day (BID) | ORAL | Status: DC

## 2012-01-13 MED ORDER — OXYCODONE-ACETAMINOPHEN 5-325 MG PO TABS
2.0000 | ORAL_TABLET | Freq: Once | ORAL | Status: AC
  Administered 2012-01-13: 2 via ORAL
  Filled 2012-01-13: qty 2

## 2012-01-13 NOTE — ED Notes (Signed)
Pt with left side pain of abdomen, hx of UTI, denies N/V
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
## 2012-01-15 ENCOUNTER — Encounter (HOSPITAL_COMMUNITY): Payer: Self-pay | Admitting: *Deleted

## 2012-01-15 ENCOUNTER — Emergency Department (HOSPITAL_COMMUNITY)
Admission: EM | Admit: 2012-01-15 | Discharge: 2012-01-15 | Disposition: A | Discharge status: Home / Self Care | Payer: Medicare Other | Attending: Emergency Medicine | Admitting: Emergency Medicine

## 2012-01-15 DIAGNOSIS — R109 Unspecified abdominal pain: Secondary | ICD-10-CM | POA: Insufficient documentation

## 2012-01-15 DIAGNOSIS — R300 Dysuria: Secondary | ICD-10-CM

## 2012-01-15 DIAGNOSIS — E785 Hyperlipidemia, unspecified: Secondary | ICD-10-CM | POA: Diagnosis not present

## 2012-01-15 DIAGNOSIS — Z79899 Other long term (current) drug therapy: Secondary | ICD-10-CM | POA: Diagnosis not present

## 2012-01-15 DIAGNOSIS — Q909 Down syndrome, unspecified: Secondary | ICD-10-CM | POA: Diagnosis not present

## 2012-01-15 DIAGNOSIS — Z7982 Long term (current) use of aspirin: Secondary | ICD-10-CM | POA: Insufficient documentation

## 2012-01-15 DIAGNOSIS — Z86718 Personal history of other venous thrombosis and embolism: Secondary | ICD-10-CM | POA: Insufficient documentation

## 2012-01-15 DIAGNOSIS — Q6410 Exstrophy of urinary bladder, unspecified: Secondary | ICD-10-CM | POA: Insufficient documentation

## 2012-01-15 LAB — URINE CULTURE
Colony Count: NO GROWTH
Culture: NO GROWTH

## 2012-01-15 LAB — URINALYSIS, ROUTINE W REFLEX MICROSCOPIC
Protein, ur: >300 mg/dL — AB
Urobilinogen, UA: 0.2 mg/dL (ref 0.0–1.0)

## 2012-01-15 LAB — URINE MICROSCOPIC-ADD ON

## 2012-01-15 MED ORDER — PHENAZOPYRIDINE HCL 200 MG PO TABS: 200.0000 mg | ORAL_TABLET | Freq: Three times a day (TID) | ORAL | Status: DC

## 2012-01-15 MED ORDER — PHENAZOPYRIDINE HCL 100 MG PO TABS
200.0000 mg | ORAL_TABLET | Freq: Once | ORAL | Status: AC
  Administered 2012-01-15: 200 mg via ORAL
  Filled 2012-01-15: qty 2

## 2012-01-15 NOTE — ED Notes (Signed)
Pt discharged. Pt stable at time of discharge. Medications reviewed pt has no questions regarding discharge at this time. Pt voiced understanding of discharge instructions.  

## 2012-01-15 NOTE — ED Provider Notes (Signed)
History   This chart was scribed for [employer]. Olin Hauser, MD by Malen Gauze, ED Scribe. The patient was seen in room APA17/APA17 and the patient's care was started at 11:01PM.    CSN: [id number]  Arrival date & time 01/15/12  2025   First MD Initiated Contact with Patient 01/15/12 2258      Chief Complaint  Patient presents with  . Dysuria  . Flank Pain    (Consider location/radiation/quality/duration/timing/severity/associated sxs/prior treatment) The history is provided by the patient. No language interpreter was used.   Frank Levine is a 52 y.o. male who presents to the Emergency Department complaining of moderate left flank pain with associated dysuria with an onset yesterday. He was here at the ED 2 days ago for nausea, vomiting, diarrhea, and dysuria. He was diagnosed with a UTI. He has been taking ciprofloxacin.  Tonight, he denies nausea, vomiting, or diarrhea. No known allergies. No other pertinent medical symptoms.  PCP Dr. Laurance Flatten  Past Medical History  Diagnosis Date  . Bladder extrophy   . Blood clot in vein   . Hyperlipemia   . Pulmonary emboli 09/27/2011    December, 2011 1 week post GU manipulation  Anti-coagulated for 6 mos  . DVT, lower extremity, distal 09/27/2011    Occurred 1 week after GU manipulation Dec, 2011 w associated PE  . Urethral stricture 09/27/2011  . Leukopenia 09/27/2011    WBC 2,600 normal diff 09/2010 - resolved spontaneously on follow up labs  . Down's syndrome 09/27/2011    Past Surgical History  Procedure Date  . Bladder surgery     History reviewed. No pertinent family history.  History  Substance Use Topics  . Smoking status: Never Smoker   . Smokeless tobacco: Not on file  . Alcohol Use: No      Review of Systems  Constitutional: Negative for fever.       10 Systems reviewed and are negative for acute change except as noted in the HPI.  HENT: Negative for congestion.   Eyes: Negative for discharge and redness.    Respiratory: Negative for cough and shortness of breath.   Cardiovascular: Negative for chest pain.  Gastrointestinal: Negative for vomiting and abdominal pain.  Genitourinary: Positive for dysuria and flank pain.  Musculoskeletal: Negative for back pain.  Skin: Negative for rash.  Neurological: Negative for syncope, numbness and headaches.  Psychiatric/Behavioral:       No behavior change.   10 Systems reviewed and all are negative for acute change except as noted in the HPI.  Allergies  Review of patient's allergies indicates no known allergies.  Home Medications   Current Outpatient Rx  Name  Route  Sig  Dispense  Refill  . ASPIRIN EC 81 MG PO TBEC   Oral   Take 81 mg by mouth daily.         Marland Kitchen CIPROFLOXACIN HCL 500 MG PO TABS   Oral   Take 1 tablet (500 mg total) by mouth 2 (two) times daily.   28 tablet   0   . DOCUSATE SODIUM 100 MG PO CAPS   Oral   Take 100 mg by mouth every evening.          Marland Kitchen FERROUS SULFATE 325 (65 FE) MG PO TABS   Oral   Take 325 mg by mouth every other day.          Marland Kitchen HYDROCODONE-ACETAMINOPHEN 5-325 MG PO TABS   Oral   Take 1 tablet by  mouth every 4 (four) hours as needed. pain         . PANTOPRAZOLE SODIUM 40 MG PO TBEC   Oral   Take 40 mg by mouth daily.         Marland Kitchen PROMETHAZINE HCL 25 MG RE SUPP   Rectal   Place 25 mg rectally every 6 (six) hours as needed. Had from a previous condition(nausea)         . SIMVASTATIN 40 MG PO TABS   Oral   Take 40 mg by mouth every evening.           BP 118/68  Pulse 110  Temp 98.7 F (37.1 C) (Oral)  Resp 20  Ht 5\' 1"  (1.549 m)  Wt 188 lb (85.276 kg)  BMI 35.52 kg/m2  SpO2 100%  Physical Exam  Nursing note and vitals reviewed. Constitutional: He appears well-developed and well-nourished.       Awake, alert, nontoxic appearance.  HENT:  Head: Atraumatic.  Eyes: Right eye exhibits no discharge. Left eye exhibits no discharge.  Neck: Neck supple.  Cardiovascular: Normal  rate and regular rhythm.   Murmur heard. Pulmonary/Chest: Effort normal and breath sounds normal. He exhibits no tenderness.  Abdominal: Soft. Bowel sounds are normal. There is tenderness. There is no rebound.       Mild suprapubic tenderness to palpation. No flank tenderness.  Musculoskeletal: He exhibits no tenderness.       Baseline ROM, no obvious new focal weakness.  Neurological:       Mental status and motor strength appears baseline for patient and situation.  Skin: No rash noted.  Psychiatric: He has a normal mood and affect.    ED Course  Procedures (including critical care time) Results for orders placed during the hospital encounter of 01/15/12  URINALYSIS, ROUTINE W REFLEX MICROSCOPIC      Component Value Range   Color, Urine RED (*) YELLOW   APPearance HAZY (*) CLEAR   Specific Gravity, Urine 1.020  1.005 - 1.030   pH 7.0  5.0 - 8.0   Glucose, UA NEGATIVE  NEGATIVE mg/dL   Hgb urine dipstick LARGE (*) NEGATIVE   Bilirubin Urine SMALL (*) NEGATIVE   Ketones, ur NEGATIVE  NEGATIVE mg/dL   Protein, ur >300 (*) NEGATIVE mg/dL   Urobilinogen, UA 0.2  0.0 - 1.0 mg/dL   Nitrite POSITIVE (*) NEGATIVE   Leukocytes, UA NEGATIVE  NEGATIVE  URINE MICROSCOPIC-ADD ON      Component Value Range   WBC, UA 7-10  <3 WBC/hpf   RBC / HPF TOO NUMEROUS TO COUNT  <3 RBC/hpf   Bacteria, UA RARE  RARE   COORDINATION OF CARE:  11:05PM - pyridium will be ordered for [name]. Pending unremarkable lab results, he will be ready for d/c.       MDM  Patient with dysuria on antibiotics for UTI. UA still reflects infection. Initiated pyridium. Pt stable in ED with no significant deterioration in condition.The patient appears reasonably screened and/or stabilized for discharge and I doubt any other medical condition or other [facility] requiring further screening, evaluation, or treatment in the ED at this time prior to discharge.  I personally performed the services described in this  documentation, which was scribed in my presence. The recorded information has been reviewed and considered.   MDM Reviewed: nursing note, previous chart and vitals Reviewed previous: labs Interpretation: labs           Gypsy Balsam. Olin Hauser, MD 01/16/12 [phone]

## 2012-01-15 NOTE — ED Notes (Signed)
Pt here for dysuria and left flank pain.

## 2012-02-08 DIAGNOSIS — N3020 Other chronic cystitis without hematuria: Secondary | ICD-10-CM | POA: Diagnosis not present

## 2012-02-08 DIAGNOSIS — R339 Retention of urine, unspecified: Secondary | ICD-10-CM | POA: Diagnosis not present

## 2012-02-21 DIAGNOSIS — B351 Tinea unguium: Secondary | ICD-10-CM | POA: Diagnosis not present

## 2012-02-21 DIAGNOSIS — R262 Difficulty in walking, not elsewhere classified: Secondary | ICD-10-CM | POA: Diagnosis not present

## 2012-02-22 DIAGNOSIS — R339 Retention of urine, unspecified: Secondary | ICD-10-CM | POA: Diagnosis not present

## 2012-02-22 DIAGNOSIS — N3020 Other chronic cystitis without hematuria: Secondary | ICD-10-CM | POA: Diagnosis not present

## 2012-02-22 DIAGNOSIS — N1330 Unspecified hydronephrosis: Secondary | ICD-10-CM | POA: Diagnosis not present

## 2012-04-26 DIAGNOSIS — N3020 Other chronic cystitis without hematuria: Secondary | ICD-10-CM | POA: Diagnosis not present

## 2012-04-26 DIAGNOSIS — N3941 Urge incontinence: Secondary | ICD-10-CM | POA: Diagnosis not present

## 2012-04-26 DIAGNOSIS — N1330 Unspecified hydronephrosis: Secondary | ICD-10-CM | POA: Diagnosis not present

## 2012-04-26 DIAGNOSIS — N35919 Unspecified urethral stricture, male, unspecified site: Secondary | ICD-10-CM | POA: Diagnosis not present

## 2012-04-26 DIAGNOSIS — R339 Retention of urine, unspecified: Secondary | ICD-10-CM | POA: Diagnosis not present

## 2012-05-04 ENCOUNTER — Emergency Department (HOSPITAL_COMMUNITY): Payer: Medicare Other

## 2012-05-04 ENCOUNTER — Emergency Department (HOSPITAL_COMMUNITY)
Admission: EM | Admit: 2012-05-04 | Discharge: 2012-05-04 | Disposition: A | Discharge status: Home / Self Care | Payer: Medicare Other | Attending: Emergency Medicine | Admitting: Emergency Medicine

## 2012-05-04 ENCOUNTER — Encounter (HOSPITAL_COMMUNITY): Payer: Self-pay | Admitting: Emergency Medicine

## 2012-05-04 DIAGNOSIS — N489 Disorder of penis, unspecified: Secondary | ICD-10-CM | POA: Insufficient documentation

## 2012-05-04 DIAGNOSIS — Z8679 Personal history of other diseases of the circulatory system: Secondary | ICD-10-CM | POA: Diagnosis not present

## 2012-05-04 DIAGNOSIS — N390 Urinary tract infection, site not specified: Secondary | ICD-10-CM

## 2012-05-04 DIAGNOSIS — N2889 Other specified disorders of kidney and ureter: Secondary | ICD-10-CM | POA: Diagnosis not present

## 2012-05-04 DIAGNOSIS — E785 Hyperlipidemia, unspecified: Secondary | ICD-10-CM | POA: Insufficient documentation

## 2012-05-04 DIAGNOSIS — Z86718 Personal history of other venous thrombosis and embolism: Secondary | ICD-10-CM | POA: Diagnosis not present

## 2012-05-04 DIAGNOSIS — R6889 Other general symptoms and signs: Secondary | ICD-10-CM | POA: Diagnosis not present

## 2012-05-04 DIAGNOSIS — R319 Hematuria, unspecified: Secondary | ICD-10-CM | POA: Diagnosis not present

## 2012-05-04 DIAGNOSIS — Z87448 Personal history of other diseases of urinary system: Secondary | ICD-10-CM | POA: Diagnosis not present

## 2012-05-04 DIAGNOSIS — Z79899 Other long term (current) drug therapy: Secondary | ICD-10-CM | POA: Diagnosis not present

## 2012-05-04 DIAGNOSIS — Z87798 Personal history of other (corrected) congenital malformations: Secondary | ICD-10-CM | POA: Insufficient documentation

## 2012-05-04 DIAGNOSIS — R109 Unspecified abdominal pain: Secondary | ICD-10-CM | POA: Diagnosis not present

## 2012-05-04 DIAGNOSIS — Z87718 Personal history of other specified (corrected) congenital malformations of genitourinary system: Secondary | ICD-10-CM | POA: Diagnosis not present

## 2012-05-04 DIAGNOSIS — Z7982 Long term (current) use of aspirin: Secondary | ICD-10-CM | POA: Insufficient documentation

## 2012-05-04 DIAGNOSIS — Z86711 Personal history of pulmonary embolism: Secondary | ICD-10-CM | POA: Diagnosis not present

## 2012-05-04 DIAGNOSIS — Z862 Personal history of diseases of the blood and blood-forming organs and certain disorders involving the immune mechanism: Secondary | ICD-10-CM | POA: Insufficient documentation

## 2012-05-04 DIAGNOSIS — I319 Disease of pericardium, unspecified: Secondary | ICD-10-CM | POA: Diagnosis not present

## 2012-05-04 DIAGNOSIS — N1330 Unspecified hydronephrosis: Secondary | ICD-10-CM | POA: Diagnosis not present

## 2012-05-04 LAB — URINALYSIS, ROUTINE W REFLEX MICROSCOPIC
Specific Gravity, Urine: 1.02 (ref 1.005–1.030)
Urobilinogen, UA: 0.2 mg/dL (ref 0.0–1.0)

## 2012-05-04 LAB — URINE MICROSCOPIC-ADD ON

## 2012-05-04 LAB — BASIC METABOLIC PANEL
BUN: 14 mg/dL (ref 6–23)
CO2: 30 mEq/L (ref 19–32)
Chloride: 101 mEq/L (ref 96–112)
Creatinine, Ser: 1.4 mg/dL — ABNORMAL HIGH (ref 0.50–1.35)
Glucose, Bld: 116 mg/dL — ABNORMAL HIGH (ref 70–99)
Potassium: 4.3 mEq/L (ref 3.5–5.1)

## 2012-05-04 LAB — CBC WITH DIFFERENTIAL/PLATELET
HCT: 41.9 % (ref 39.0–52.0)
Hemoglobin: 14.4 g/dL (ref 13.0–17.0)
Lymphocytes Relative: 5 % — ABNORMAL LOW (ref 12–46)
Lymphs Abs: 0.5 10*3/uL — ABNORMAL LOW (ref 0.7–4.0)
MCHC: 34.4 g/dL (ref 30.0–36.0)
Monocytes Absolute: 0.9 10*3/uL (ref 0.1–1.0)
Monocytes Relative: 10 % (ref 3–12)
Neutro Abs: 7.4 10*3/uL (ref 1.7–7.7)
Neutrophils Relative %: 84 % — ABNORMAL HIGH (ref 43–77)
RBC: 4.32 MIL/uL (ref 4.22–5.81)
WBC: 8.8 10*3/uL (ref 4.0–10.5)

## 2012-05-04 MED ORDER — DEXTROSE 5 % IV SOLN: 1.0000 g | Freq: Once | INTRAVENOUS | Status: DC

## 2012-05-04 MED ORDER — CEPHALEXIN 500 MG PO CAPS
500.0000 mg | ORAL_CAPSULE | Freq: Once | ORAL | Status: AC
  Administered 2012-05-04: 500 mg via ORAL
  Filled 2012-05-04: qty 1

## 2012-05-04 MED ORDER — CEPHALEXIN 500 MG PO CAPS: 500.0000 mg | ORAL_CAPSULE | Freq: Four times a day (QID) | ORAL | Status: DC

## 2012-05-04 NOTE — ED Notes (Signed)
Patient with c/o abdominal pain that started today. Patient has Down's syndrome, not specific with pain complaint.

## 2012-05-04 NOTE — ED Notes (Signed)
Patient with no complaints at this time. Respirations even and unlabored. Skin warm/dry. Discharge instructions reviewed with patient at this time. Patient given opportunity to voice concerns/ask questions. Patient discharged at this time and left Emergency Department via wheelchair. 

## 2012-05-04 NOTE — ED Provider Notes (Signed)
History     CSN: [id number]  Arrival date & time 05/04/12  1139   First MD Initiated Contact with Patient 05/04/12 1147      Chief Complaint  Patient presents with  . Abdominal Pain    (Consider location/radiation/quality/duration/timing/severity/associated sxs/prior treatment) HPI Comments: Level V caveat apply secondary to mental retardation  The patient is a 52 year old male who complains of one day of burning pain in his lower abdomen and penis. He has recently been evaluated by a urologist secondary to a history of thickening of his bladder wall. The mother states that an ultrasound was recently performed confirming that he still has bladder wall thickening and that he has had urinary infections in the past and sometimes requiring Foley catheter placement. The patient states that the pain started today, he has not been vomiting and denies any fevers.  Patient is a 52 y.o. male presenting with abdominal pain. The history is provided by the patient and a relative.  Abdominal Pain   Past Medical History  Diagnosis Date  . Bladder extrophy   . Blood clot in vein   . Hyperlipemia   . Pulmonary emboli 09/27/2011    December, 2011 1 week post GU manipulation  Anti-coagulated for 6 mos  . DVT, lower extremity, distal 09/27/2011    Occurred 1 week after GU manipulation Dec, 2011 w associated PE  . Urethral stricture 09/27/2011  . Leukopenia 09/27/2011    WBC 2,600 normal diff 09/2010 - resolved spontaneously on follow up labs  . Down's syndrome 09/27/2011    Past Surgical History  Procedure Laterality Date  . Bladder surgery      No family history on file.  History  Substance Use Topics  . Smoking status: Never Smoker   . Smokeless tobacco: Not on file  . Alcohol Use: No      Review of Systems  Unable to perform ROS: Other  Gastrointestinal: Positive for abdominal pain.    Allergies  Review of patient's allergies indicates no known allergies.  Home Medications    Current Outpatient Rx  Name  Route  Sig  Dispense  Refill  . aspirin EC 81 MG tablet   Oral   Take 81 mg by mouth daily.         . ciprofloxacin (CIPRO) 500 MG tablet   Oral   Take 1 tablet (500 mg total) by mouth 2 (two) times daily.   28 tablet   0   . docusate sodium (COLACE) 100 MG capsule   Oral   Take 100 mg by mouth every evening.          . ferrous sulfate 325 (65 FE) MG tablet   Oral   Take 325 mg by mouth every other day.          Marland Kitchen HYDROcodone-acetaminophen (NORCO/VICODIN) 5-325 MG per tablet   Oral   Take 1 tablet by mouth every 4 (four) hours as needed. pain         . pantoprazole (PROTONIX) 40 MG tablet   Oral   Take 40 mg by mouth daily.         . phenazopyridine (PYRIDIUM) 200 MG tablet   Oral   Take 1 tablet (200 mg total) by mouth 3 (three) times daily.   6 tablet   0   . promethazine (PHENERGAN) 25 MG suppository   Rectal   Place 25 mg rectally every 6 (six) hours as needed. Had from a previous condition(nausea)         .  simvastatin (ZOCOR) 40 MG tablet   Oral   Take 40 mg by mouth every evening.           BP 114/87  Pulse 112  Temp(Src) 98.4 F (36.9 C) (Oral)  Resp 21  SpO2 97%  Physical Exam  Nursing note and vitals reviewed. Constitutional: He appears well-developed and well-nourished. No distress.  HENT:  Head: Normocephalic and atraumatic.  Mouth/Throat: Oropharynx is clear and moist. No oropharyngeal exudate.  Eyes: Conjunctivae and EOM are normal. Pupils are equal, round, and reactive to light. Right eye exhibits no discharge. Left eye exhibits no discharge. No scleral icterus.  Neck: Normal range of motion. Neck supple. No JVD present. No thyromegaly present.  Cardiovascular: Normal rate, regular rhythm, normal heart sounds and intact distal pulses.  Exam reveals no gallop and no friction rub.   No murmur heard. Pulmonary/Chest: Effort normal and breath sounds normal. No respiratory distress. He has no  wheezes. He has no rales.  Abdominal: Soft. Bowel sounds are normal. He exhibits no distension and no mass. There is tenderness (mild tenderness in the suprapubic area, no guarding, no pain in the right lower quadrant).  Genitourinary:  No inguinal hernias present, normal appearing scrotum and testicles bilaterally, normal circumcised penis however there is hypospadias, no discharge drainage or blood at the urethral meatus  Musculoskeletal: Normal range of motion. He exhibits no edema and no tenderness.  Lymphadenopathy:    He has no cervical adenopathy.  Neurological: He is alert. Coordination normal.  Skin: Skin is warm and dry. No rash noted. No erythema.  Psychiatric: He has a normal mood and affect. His behavior is normal.    ED Course  Procedures (including critical care time)  Labs Reviewed  URINE CULTURE  URINALYSIS, ROUTINE W REFLEX MICROSCOPIC   No results found.   No diagnosis found.    MDM  The mother also comments that the patient has had some urinary retention over the last month requiring him to use a pad for leakage. We'll obtain a urine sample, evaluate for urinary infection, will need further evaluation if urinalysis does not reveal a source of symptoms. Otherwise the patient is hemodynamically stable and afebrile  Filed Vitals:   05/04/12 1145  BP: 114/87  Pulse: 112  Temp: 98.4 F (36.9 C)  Resp: 21    CT pending as UA has hematuria, r/o appy / KS, pt stable appeaing, change of shift - care signed out to Dr. Jannet Mantis, MD 05/04/12 [phone]

## 2012-05-04 NOTE — ED Notes (Signed)
Patient's mother inquiring about wait. Dr Rogene Houston made aware.

## 2012-05-04 NOTE — ED Provider Notes (Signed)
Results for orders placed during the hospital encounter of 05/04/12  URINALYSIS, ROUTINE W REFLEX MICROSCOPIC      Result Value Range   Color, Urine YELLOW  YELLOW   APPearance CLOUDY (*) CLEAR   Specific Gravity, Urine 1.020  1.005 - 1.030   pH 7.5  5.0 - 8.0   Glucose, UA NEGATIVE  NEGATIVE mg/dL   Hgb urine dipstick LARGE (*) NEGATIVE   Bilirubin Urine NEGATIVE  NEGATIVE   Ketones, ur NEGATIVE  NEGATIVE mg/dL   Protein, ur >300 (*) NEGATIVE mg/dL   Urobilinogen, UA 0.2  0.0 - 1.0 mg/dL   Nitrite POSITIVE (*) NEGATIVE   Leukocytes, UA SMALL (*) NEGATIVE  URINE MICROSCOPIC-ADD ON      Result Value Range   WBC, UA 3-6  <3 WBC/hpf   RBC / HPF TOO NUMEROUS TO COUNT  <3 RBC/hpf   Bacteria, UA FEW (*) RARE  CBC WITH DIFFERENTIAL      Result Value Range   WBC 8.8  4.0 - 10.5 K/uL   RBC 4.32  4.22 - 5.81 MIL/uL   Hemoglobin 14.4  13.0 - 17.0 g/dL   HCT 41.9  39.0 - 52.0 %   MCV 97.0  78.0 - 100.0 fL   MCH 33.3  26.0 - 34.0 pg   MCHC 34.4  30.0 - 36.0 g/dL   RDW 14.1  11.5 - 15.5 %   Platelets 288  150 - 400 K/uL   Neutrophils Relative 84 (*) 43 - 77 %   Neutro Abs 7.4  1.7 - 7.7 K/uL   Lymphocytes Relative 5 (*) 12 - 46 %   Lymphs Abs 0.5 (*) 0.7 - 4.0 K/uL   Monocytes Relative 10  3 - 12 %   Monocytes Absolute 0.9  0.1 - 1.0 K/uL   Eosinophils Relative 1  0 - 5 %   Eosinophils Absolute 0.1  0.0 - 0.7 K/uL   Basophils Relative 1  0 - 1 %   Basophils Absolute 0.0  0.0 - 0.1 K/uL  BASIC METABOLIC PANEL      Result Value Range   Sodium 138  135 - 145 mEq/L   Potassium 4.3  3.5 - 5.1 mEq/L   Chloride 101  96 - 112 mEq/L   CO2 30  19 - 32 mEq/L   Glucose, Bld 116 (*) 70 - 99 mg/dL   BUN 14  6 - 23 mg/dL   Creatinine, Ser 1.40 (*) 0.50 - 1.35 mg/dL   Calcium 9.1  8.4 - 10.5 mg/dL   GFR calc non Af Amer 56 (*) >90 mL/min   GFR calc Af Amer 65 (*) >90 mL/min   Ct Abdomen Pelvis Wo Contrast  05/04/2012  *RADIOLOGY REPORT*  Clinical Data: Abdominal pain.  CT ABDOMEN AND PELVIS  WITHOUT CONTRAST  Technique:  Multidetector CT imaging of the abdomen and pelvis was performed following the standard protocol without intravenous contrast.  Comparison: CT scan of the abdomen pelvis 09/30/2011, 08/01/2010, and 01/20/2010.  Findings: The lung bases are clear without focal nodule, mass, or airspace disease.  The heart size is normal.  A small pericardial effusion is stable.  The liver and spleen are within normal limits.  The stomach, duodenum, and pancreas are unremarkable.  The common bile duct and gallbladder are within normal limits as well.  The adrenal glands are normal bilaterally.  Severe bilateral hydronephrosis is similar to the prior studies. Parenchymal atrophy is worse on the left.  The distal left ureter tapers.  The distal right ureter remains dilated with a prominent ureterocele.  Diffuse bladder wall thickening is again noted.  The rectosigmoid colon is within normal limits.  The remainder of the colon is unremarkable.  The appendix is visualized and within normal limits.  No significant adenopathy or free fluid is present. Fat herniates into the left inguinal canal without associated bowel.  The bone windows demonstrate a transitional S1 segment.  No focal lytic or blastic lesions are present.  Facet degenerative changes are more prominent right than left at L5-S1.  A transitional S1 segment is noted.  IMPRESSION:  1.  Similar appearance of severe bilateral hydronephrosis and ureteral dilation. 2.  Diffuse wall thickening of the bladder compatible with a chronic neurogenic bladder. 3.  Right-sided ureterocele is stable. 4.  Similar appearance of small pericardial effusion. 5.  Stable spondylosis in the lower lumbar spine.   Original Report Authenticated By: San Morelle, M.D.     CT scan without any significant findings to explain the pain urinalysis hematuria and positive nitrite consistent with urinary tract infection. We'll give first dose of Keflex here and sent home  with Keflex antibiotic for the next 7 days and followup with primary care Dr. At time of discharge patient's nontoxic no acute distress.  Mervin Kung, MD 05/04/12 [phone]

## 2012-05-06 LAB — URINE CULTURE

## 2012-05-18 DIAGNOSIS — N390 Urinary tract infection, site not specified: Secondary | ICD-10-CM | POA: Diagnosis not present

## 2012-05-31 IMAGING — CT CT ANGIO CHEST
1 of 2 series · 19 of 32 positions shown · IV contrast (APPLIED)
Comparison: None

There is *RADIOLOGY REPORT*
CLINICAL DATA: Shortness of breath, chest pain.

CT ANGIOGRAPHY CHEST WITH CONTRAST
TECHNIQUE: Multidetector CT imaging of the chest was performed
using the standard protocol during bolus administration of
intravenous contrast.  Multiplanar CT image reconstructions
including MIPs were obtained to evaluate the vascular anatomy.
Contrast:  80 ml Omnipaque 300 IV.

[Series 5: pe thins @ 1mm · axial · 0.63mm/px · z∈[-247,+11]mm · 19 of 284 slices shown]
[im 13/284  lung]
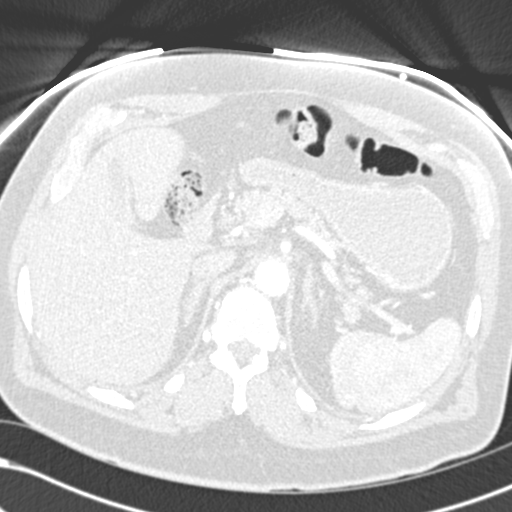
[im 25/284  soft-tissue]
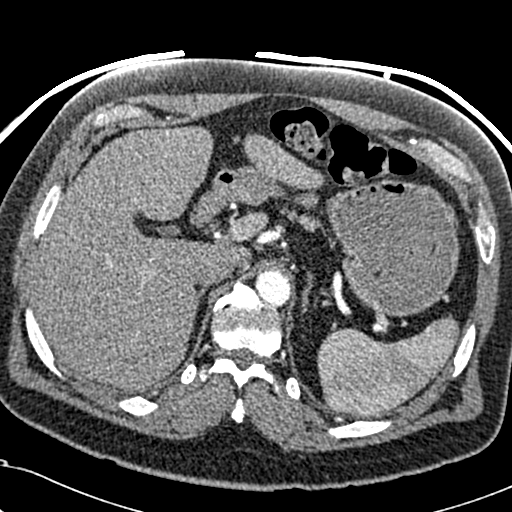
[im 37/284  lung]
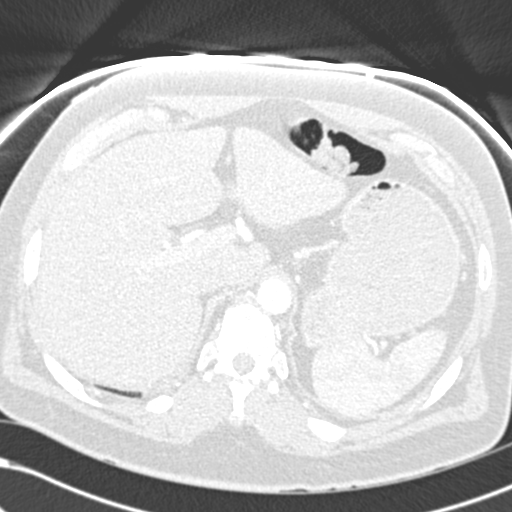
[im 62/284  soft-tissue]
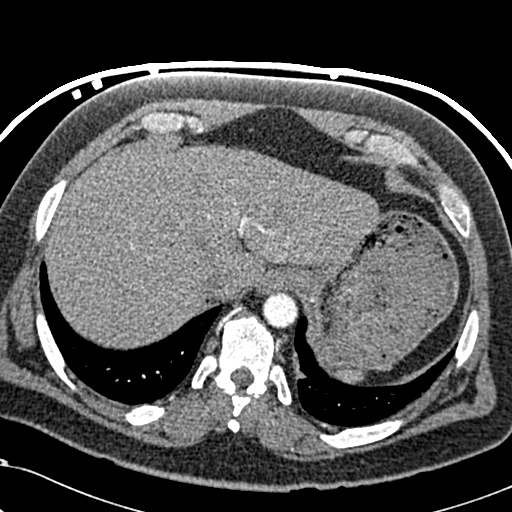
[im 74/284  lung]
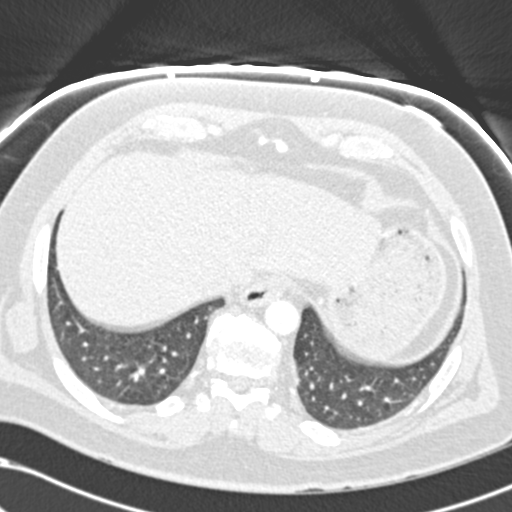
[im 87/284  soft-tissue]
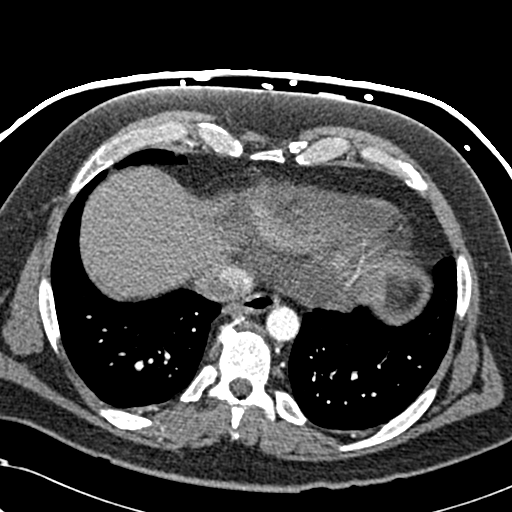
[im 99/284  lung]
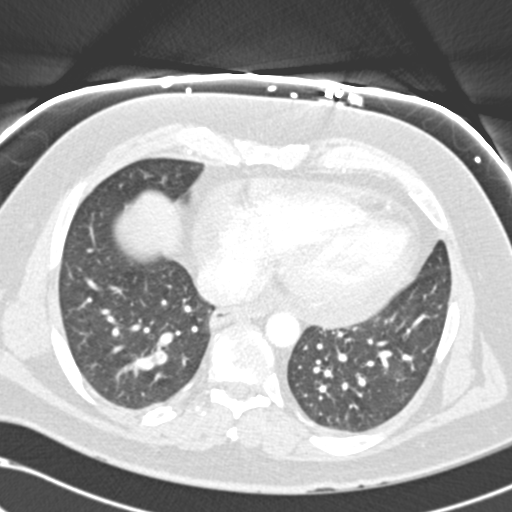
[im 111/284  soft-tissue]
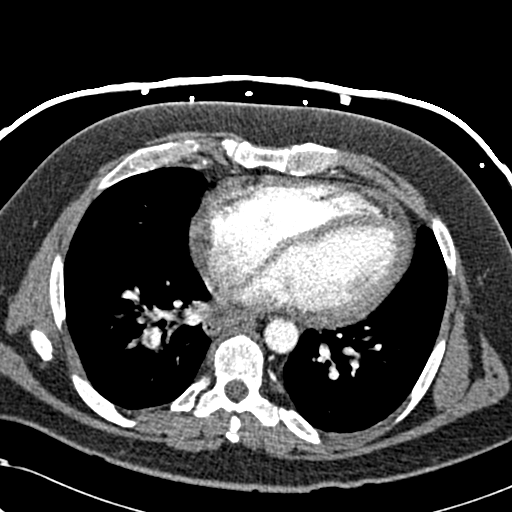
[im 124/284  lung]
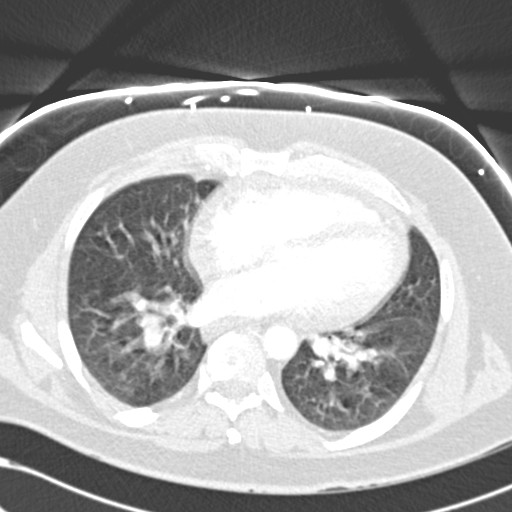
[im 148/284  soft-tissue]
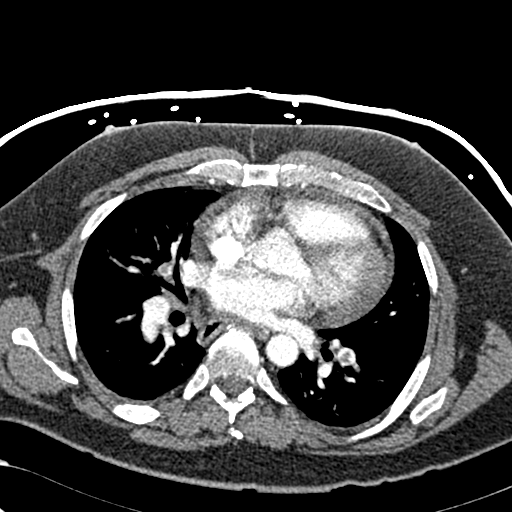
[im 160/284  lung]
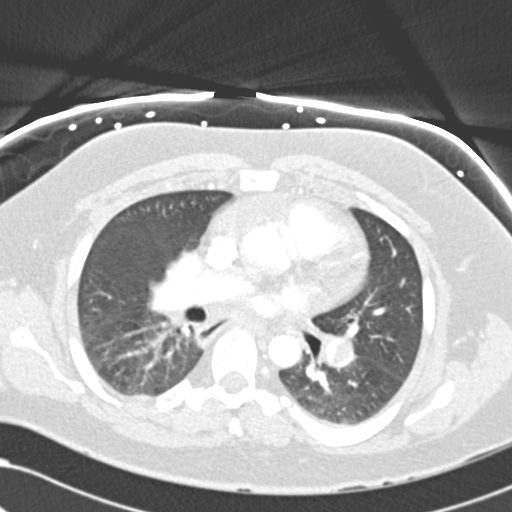
[im 173/284  soft-tissue]
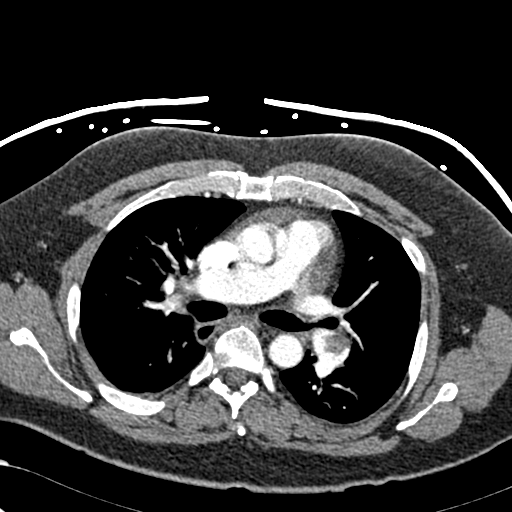
[im 185/284  lung]
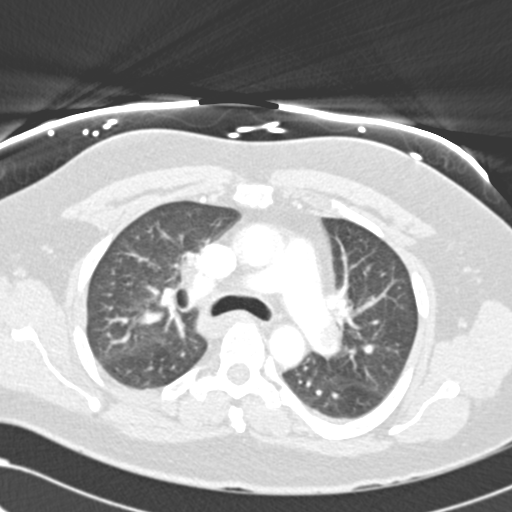
[im 197/284  soft-tissue]
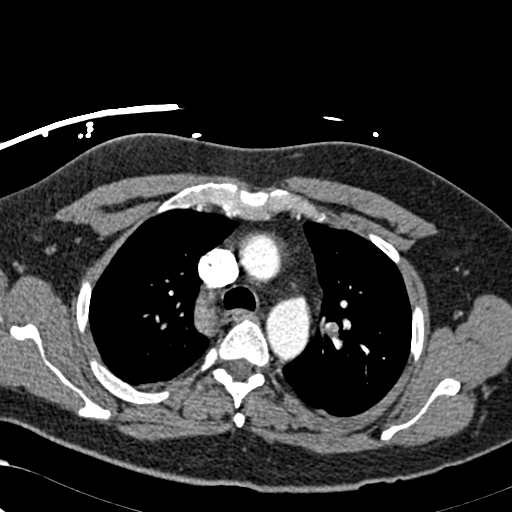
[im 210/284  lung]
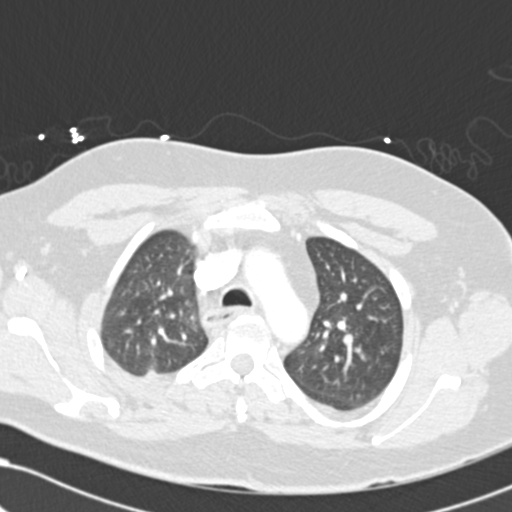
[im 222/284  soft-tissue]
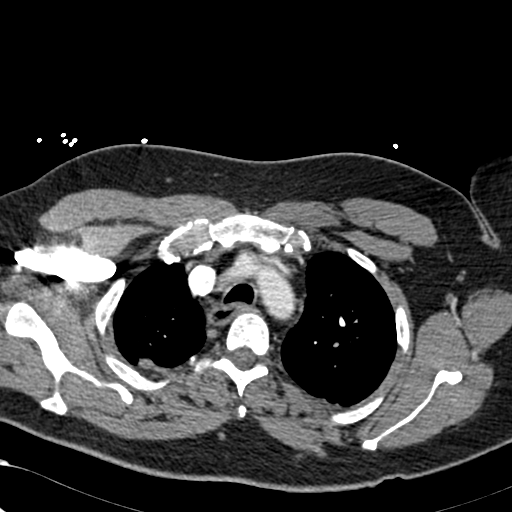
[im 247/284  lung]
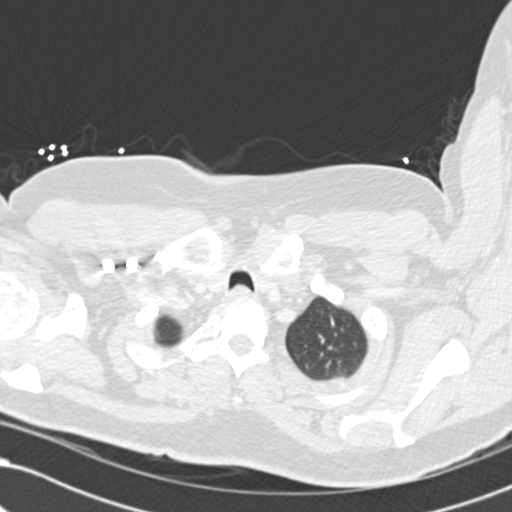
[im 259/284  soft-tissue]
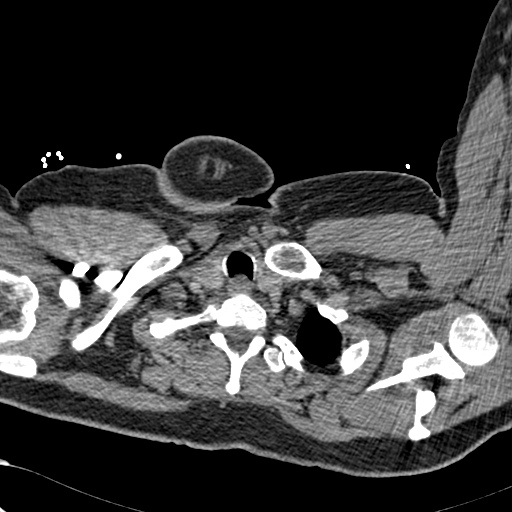
[im 271/284  lung]
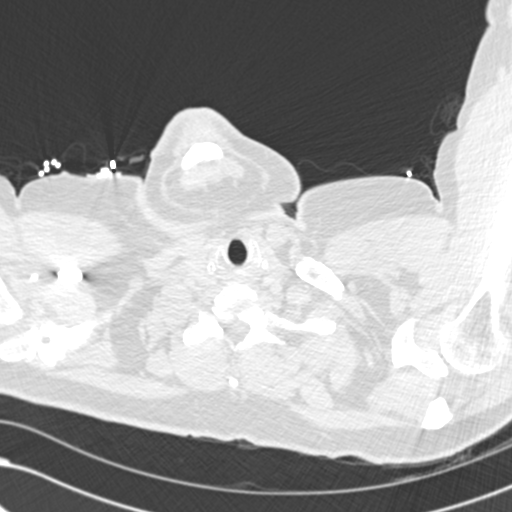

[19 of 32 positions shown; findings below may reference images not displayed]

FINDINGS: There are bilateral pulmonary emboli.  This involves
both upper and lower  lobes.  This is more pronounced on the left.

Heart is borderline in size. No mediastinal, hilar, or axillary
adenopathy.  Lungs are clear.  No pleural effusion.  There appears
to be a small pericardial effusion.

Visualized thyroid and chest wall soft tissues unremarkable.
Imaging into the upper abdomen shows no acute findings.

Review of the MIP images confirms the above findings.
IMPRESSION: Extensive bilateral pulmonary emboli.

Small pericardial effusion.

These results were called to the patient's nurse at the time of
interpretation.

## 2012-07-12 ENCOUNTER — Other Ambulatory Visit: Payer: Self-pay | Admitting: Family Medicine

## 2012-07-17 DIAGNOSIS — B351 Tinea unguium: Secondary | ICD-10-CM | POA: Diagnosis not present

## 2012-08-01 ENCOUNTER — Other Ambulatory Visit: Payer: Self-pay | Admitting: Family Medicine

## 2012-08-21 ENCOUNTER — Other Ambulatory Visit: Payer: Self-pay | Admitting: Nurse Practitioner

## 2012-08-21 DIAGNOSIS — K219 Gastro-esophageal reflux disease without esophagitis: Secondary | ICD-10-CM

## 2012-08-21 MED ORDER — ESOMEPRAZOLE MAGNESIUM 40 MG PO CPDR: 40.0000 mg | DELAYED_RELEASE_CAPSULE | Freq: Every day | ORAL | Status: DC

## 2012-09-04 DIAGNOSIS — G44209 Tension-type headache, unspecified, not intractable: Secondary | ICD-10-CM | POA: Diagnosis not present

## 2012-09-04 DIAGNOSIS — H1045 Other chronic allergic conjunctivitis: Secondary | ICD-10-CM | POA: Diagnosis not present

## 2012-09-13 ENCOUNTER — Encounter: Payer: Self-pay | Admitting: Family Medicine

## 2012-09-13 ENCOUNTER — Ambulatory Visit (INDEPENDENT_AMBULATORY_CARE_PROVIDER_SITE_OTHER): Payer: Medicare Other | Admitting: Family Medicine

## 2012-09-13 VITALS — BP 108/69 | HR 66 | Temp 98.2°F | Ht 62.0 in | Wt 190.0 lb

## 2012-09-13 DIAGNOSIS — E785 Hyperlipidemia, unspecified: Secondary | ICD-10-CM

## 2012-09-13 DIAGNOSIS — Q909 Down syndrome, unspecified: Secondary | ICD-10-CM | POA: Diagnosis not present

## 2012-09-13 DIAGNOSIS — K219 Gastro-esophageal reflux disease without esophagitis: Secondary | ICD-10-CM | POA: Diagnosis not present

## 2012-09-13 LAB — POCT CBC
Granulocyte percent: 75.6 %G (ref 37–80)
HCT, POC: 41.5 % — AB (ref 43.5–53.7)
Hemoglobin: 15.2 g/dL (ref 14.1–18.1)
Lymph, poc: 0.7 (ref 0.6–3.4)
MCH, POC: 36.3 pg — AB (ref 27–31.2)
MCHC: 36.5 g/dL — AB (ref 31.8–35.4)
MCV: 99.4 fL — AB (ref 80–97)
MPV: 8.5 fL (ref 0–99.8)
POC Granulocyte: 2.4 (ref 2–6.9)
POC LYMPH PERCENT: 20.6 %L (ref 10–50)
Platelet Count, POC: 184 10*3/uL (ref 142–424)
RBC: 4.2 M/uL — AB (ref 4.69–6.13)
RDW, POC: 13.6 %
WBC: 3.2 10*3/uL — AB (ref 4.6–10.2)

## 2012-09-13 LAB — LIPID PANEL
Cholesterol: 167 mg/dL (ref 0–200)
HDL: 56 mg/dL (ref >39)
LDL Cholesterol: 96 mg/dL (ref 0–99)
Total CHOL/HDL Ratio: 3 Ratio
Triglycerides: 73 mg/dL (ref <150)
VLDL: 15 mg/dL (ref 0–40)

## 2012-09-13 LAB — COMPLETE METABOLIC PANEL WITH GFR
ALT: 28 U/L (ref 0–53)
AST: 25 U/L (ref 0–37)
Albumin: 3.7 g/dL (ref 3.5–5.2)
Alkaline Phosphatase: 62 U/L (ref 39–117)
BUN: 14 mg/dL (ref 6–23)
CO2: 31 mEq/L (ref 19–32)
Calcium: 8.9 mg/dL (ref 8.4–10.5)
Chloride: 104 mEq/L (ref 96–112)
Creat: 1.38 mg/dL — ABNORMAL HIGH (ref 0.50–1.35)
GFR, Est African American: 67 mL/min
GFR, Est Non African American: 58 mL/min — ABNORMAL LOW
Glucose, Bld: 94 mg/dL (ref 70–99)
Potassium: 4.4 mEq/L (ref 3.5–5.3)
Sodium: 139 mEq/L (ref 135–145)
Total Bilirubin: 0.8 mg/dL (ref 0.3–1.2)
Total Protein: 6.5 g/dL (ref 6.0–8.3)

## 2012-09-13 MED ORDER — SIMVASTATIN 40 MG PO TABS: 40.0000 mg | ORAL_TABLET | Freq: Every day | ORAL | Status: DC

## 2012-09-13 MED ORDER — PANTOPRAZOLE SODIUM 40 MG PO TBEC: 40.0000 mg | DELAYED_RELEASE_TABLET | Freq: Every day | ORAL | Status: DC

## 2012-09-13 NOTE — Patient Instructions (Signed)
Hypertriglyceridemia  Diet for High blood levels of Triglycerides Most fats in food are triglycerides. Triglycerides in your blood are stored as fat in your body. High levels of triglycerides in your blood may put you at a greater risk for heart disease and stroke.  Normal triglyceride levels are less than 150 mg/dL. Borderline high levels are 150-199 mg/dl. High levels are 200 - 499 mg/dL, and very high triglyceride levels are greater than 500 mg/dL. The decision to treat high triglycerides is generally based on the level. For people with borderline or high triglyceride levels, treatment includes weight loss and exercise. Drugs are recommended for people with very high triglyceride levels. Many people who need treatment for high triglyceride levels have metabolic syndrome. This syndrome is a collection of disorders that often include: insulin resistance, high blood pressure, blood clotting problems, high cholesterol and triglycerides. TESTING PROCEDURE FOR TRIGLYCERIDES  You should not eat 4 hours before getting your triglycerides measured. The normal range of triglycerides is between 10 and 250 milligrams per deciliter (mg/dl). Some people may have extreme levels (1000 or above), but your triglyceride level may be too high if it is above 150 mg/dl, depending on what other risk factors you have for heart disease.  People with high blood triglycerides may also have high blood cholesterol levels. If you have high blood cholesterol as well as high blood triglycerides, your risk for heart disease is probably greater than if you only had high triglycerides. High blood cholesterol is one of the main risk factors for heart disease. CHANGING YOUR DIET  Your weight can affect your blood triglyceride level. If you are more than 20% above your ideal body weight, you may be able to lower your blood triglycerides by losing weight. Eating less and exercising regularly is the best way to combat this. Fat provides more  calories than any other food. The best way to lose weight is to eat less fat. Only 30% of your total calories should come from fat. Less than 7% of your diet should come from saturated fat. A diet low in fat and saturated fat is the same as a diet to decrease blood cholesterol. By eating a diet lower in fat, you may lose weight, lower your blood cholesterol, and lower your blood triglyceride level.  Eating a diet low in fat, especially saturated fat, may also help you lower your blood triglyceride level. Ask your dietitian to help you figure how much fat you can eat based on the number of calories your caregiver has prescribed for you.  Exercise, in addition to helping with weight loss may also help lower triglyceride levels.   Alcohol can increase blood triglycerides. You may need to stop drinking alcoholic beverages.  Too much carbohydrate in your diet may also increase your blood triglycerides. Some complex carbohydrates are necessary in your diet. These may include bread, rice, potatoes, other starchy vegetables and cereals.  Reduce "simple" carbohydrates. These may include pure sugars, candy, honey, and jelly without losing other nutrients. If you have the kind of high blood triglycerides that is affected by the amount of carbohydrates in your diet, you will need to eat less sugar and less high-sugar foods. Your caregiver can help you with this.  Adding 2-4 grams of fish oil (EPA+ DHA) may also help lower triglycerides. Speak with your caregiver before adding any supplements to your regimen. Following the Diet  Maintain your ideal weight. Your caregivers can help you with a diet. Generally, eating less food and getting more   exercise will help you lose weight. Joining a weight control group may also help. Ask your caregivers for a good weight control group in your area.  Eat low-fat foods instead of high-fat foods. This can help you lose weight too.  These foods are lower in fat. Eat MORE of these:    Dried beans, peas, and lentils.  Egg whites.  Low-fat cottage cheese.  Fish.  Lean cuts of meat, such as round, sirloin, rump, and flank (cut extra fat off meat you fix).  Whole grain breads, cereals and pasta.  Skim and nonfat dry milk.  Low-fat yogurt.  Poultry without the skin.  Cheese made with skim or part-skim milk, such as mozzarella, parmesan, farmers', ricotta, or pot cheese. These are higher fat foods. Eat LESS of these:   Whole milk and foods made from whole milk, such as American, blue, cheddar, monterey jack, and swiss cheese  High-fat meats, such as luncheon meats, sausages, knockwurst, bratwurst, hot dogs, ribs, corned beef, ground pork, and regular ground beef.  Fried foods. Limit saturated fats in your diet. Substituting unsaturated fat for saturated fat may decrease your blood triglyceride level. You will need to read package labels to know which products contain saturated fats.  These foods are high in saturated fat. Eat LESS of these:   Fried pork skins.  Whole milk.  Skin and fat from poultry.  Palm oil.  Butter.  Shortening.  Cream cheese.  Bacon.  Margarines and baked goods made from listed oils.  Vegetable shortenings.  Chitterlings.  Fat from meats.  Coconut oil.  Palm kernel oil.  Lard.  Cream.  Sour cream.  Fatback.  Coffee whiteners and non-dairy creamers made with these oils.  Cheese made from whole milk. Use unsaturated fats (both polyunsaturated and monounsaturated) moderately. Remember, even though unsaturated fats are better than saturated fats; you still want a diet low in total fat.  These foods are high in unsaturated fat:   Canola oil.  Sunflower oil.  Mayonnaise.  Almonds.  Peanuts.  Pine nuts.  Margarines made with these oils.  Safflower oil.  Olive oil.  Avocados.  Cashews.  Peanut butter.  Sunflower seeds.  Soybean oil.  Peanut  oil.  Olives.  Pecans.  Walnuts.  Pumpkin seeds. Avoid sugar and other high-sugar foods. This will decrease carbohydrates without decreasing other nutrients. Sugar in your food goes rapidly to your blood. When there is excess sugar in your blood, your liver may use it to make more triglycerides. Sugar also contains calories without other important nutrients.  Eat LESS of these:   Sugar, brown sugar, powdered sugar, jam, jelly, preserves, honey, syrup, molasses, pies, candy, cakes, cookies, frosting, pastries, colas, soft drinks, punches, fruit drinks, and regular gelatin.  Avoid alcohol. Alcohol, even more than sugar, may increase blood triglycerides. In addition, alcohol is high in calories and low in nutrients. Ask for sparkling water, or a diet soft drink instead of an alcoholic beverage. Suggestions for planning and preparing meals   Bake, broil, grill or roast meats instead of frying.  Remove fat from meats and skin from poultry before cooking.  Add spices, herbs, lemon juice or vinegar to vegetables instead of salt, rich sauces or gravies.  Use a non-stick skillet without fat or use no-stick sprays.  Cool and refrigerate stews and broth. Then remove the hardened fat floating on the surface before serving.  Refrigerate meat drippings and skim off fat to make low-fat gravies.  Serve more fish.  Use less butter,   margarine and other high-fat spreads on bread or vegetables.  Use skim or reconstituted non-fat dry milk for cooking.  Cook with low-fat cheeses.  Substitute low-fat yogurt or cottage cheese for all or part of the sour cream in recipes for sauces, dips or congealed salads.  Use half yogurt/half mayonnaise in salad recipes.  Substitute evaporated skim milk for cream. Evaporated skim milk or reconstituted non-fat dry milk can be whipped and substituted for whipped cream in certain recipes.  Choose fresh fruits for dessert instead of high-fat foods such as pies or  cakes. Fruits are naturally low in fat. When Dining Out   Order low-fat appetizers such as fruit or vegetable juice, pasta with vegetables or tomato sauce.  Select clear, rather than cream soups.  Ask that dressings and gravies be served on the side. Then use less of them.  Order foods that are baked, broiled, poached, steamed, stir-fried, or roasted.  Ask for margarine instead of butter, and use only a small amount.  Drink sparkling water, unsweetened tea or coffee, or diet soft drinks instead of alcohol or other sweet beverages. QUESTIONS AND ANSWERS ABOUT OTHER FATS IN THE BLOOD: SATURATED FAT, TRANS FAT, AND CHOLESTEROL What is trans fat? Trans fat is a type of fat that is formed when vegetable oil is hardened through a process called hydrogenation. This process helps makes foods more solid, gives them shape, and prolongs their shelf life. Trans fats are also called hydrogenated or partially hydrogenated oils.  What do saturated fat, trans fat, and cholesterol in foods have to do with heart disease? Saturated fat, trans fat, and cholesterol in the diet all raise the level of LDL "bad" cholesterol in the blood. The higher the LDL cholesterol, the greater the risk for coronary heart disease (CHD). Saturated fat and trans fat raise LDL similarly.  What foods contain saturated fat, trans fat, and cholesterol? High amounts of saturated fat are found in animal products, such as fatty cuts of meat, chicken skin, and full-fat dairy products like butter, whole milk, cream, and cheese, and in tropical vegetable oils such as palm, palm kernel, and coconut oil. Trans fat is found in some of the same foods as saturated fat, such as vegetable shortening, some margarines (especially hard or stick margarine), crackers, cookies, baked goods, fried foods, salad dressings, and other processed foods made with partially hydrogenated vegetable oils. Small amounts of trans fat also occur naturally in some animal  products, such as milk products, beef, and lamb. Foods high in cholesterol include liver, other organ meats, egg yolks, shrimp, and full-fat dairy products. How can I use the new food label to make heart-healthy food choices? Check the Nutrition Facts panel of the food label. Choose foods lower in saturated fat, trans fat, and cholesterol. For saturated fat and cholesterol, you can also use the Percent Daily Value (%DV): 5% DV or less is low, and 20% DV or more is high. (There is no %DV for trans fat.) Use the Nutrition Facts panel to choose foods low in saturated fat and cholesterol, and if the trans fat is not listed, read the ingredients and limit products that list shortening or hydrogenated or partially hydrogenated vegetable oil, which tend to be high in trans fat. POINTS TO REMEMBER:   Discuss your risk for heart disease with your caregivers, and take steps to reduce risk factors.  Change your diet. Choose foods that are low in saturated fat, trans fat, and cholesterol.  Add exercise to your daily routine if   it is not already being done. Participate in physical activity of moderate intensity, like brisk walking, for at least 30 minutes on most, and preferably all days of the week. No time? Break the 30 minutes into three, 10-minute segments during the day.  Stop smoking. If you do smoke, contact your caregiver to discuss ways in which they can help you quit.  Do not use street drugs.  Maintain a normal weight.  Maintain a healthy blood pressure.  Keep up with your blood work for checking the fats in your blood as directed by your caregiver. Document Released: 12/10/2003 Document Revised: 08/23/2011 Document Reviewed: 07/07/2008 [employer] Patient Information 2014 [employer].  

## 2012-09-13 NOTE — Progress Notes (Signed)
  Subjective:    Patient ID: Frank Levine, male    DOB: 1960-06-07, 52 y.o.   MRN: [id number]  HPI This 52 y.o. male presents for evaluation of routine visit.  He has hx of hyperlipidemia, GERD, and Down's syndrome.  He sees urology for BPH and has an appointment for tomorrow.  He has no acute  complaints.   Review of Systems No chest pain, SOB, HA, dizziness, vision change, N/V, diarrhea, constipation, dysuria, urinary urgency or frequency, myalgias, arthralgias or rash.     Objective:   Physical Exam Vital signs noted  Well developed well nourished male.  HEENT - Head atraumatic Normocephalic                Eyes - PERRLA, Conjuctiva - clear Sclera- Clear EOMI                Ears - EAC's Wnl TM's Wnl Gross Hearing WNL                Nose - Nares patent                 Throat - oropharanx wnl Respiratory - Lungs CTA bilateral Cardiac - RRR S1 and S2 without murmur GI - Abdomen soft Nontender and bowel sounds active x 4 Extremities - No edema. Neuro - Grossly intact.       Assessment & Plan:  Other and unspecified hyperlipidemia - Plan: simvastatin (ZOCOR) 40 MG tablet, POCT CBC, Lipid panel, COMPLETE METABOLIC PANEL WITH GFR  GERD (gastroesophageal reflux disease) - Plan: pantoprazole (PROTONIX) 40 MG tablet  Controlling his GERD well.  Down's syndrome - Seems to be doing well.  BPH- Follow up with Urology tomorrow and continue flomax.

## 2012-09-18 DIAGNOSIS — N1330 Unspecified hydronephrosis: Secondary | ICD-10-CM | POA: Diagnosis not present

## 2012-09-18 DIAGNOSIS — R339 Retention of urine, unspecified: Secondary | ICD-10-CM | POA: Diagnosis not present

## 2012-09-18 DIAGNOSIS — N35919 Unspecified urethral stricture, male, unspecified site: Secondary | ICD-10-CM | POA: Diagnosis not present

## 2012-10-09 DIAGNOSIS — M79609 Pain in unspecified limb: Secondary | ICD-10-CM | POA: Diagnosis not present

## 2012-10-09 DIAGNOSIS — B351 Tinea unguium: Secondary | ICD-10-CM | POA: Diagnosis not present

## 2012-11-15 ENCOUNTER — Other Ambulatory Visit (INDEPENDENT_AMBULATORY_CARE_PROVIDER_SITE_OTHER): Payer: Medicare Other

## 2012-11-15 DIAGNOSIS — N1330 Unspecified hydronephrosis: Secondary | ICD-10-CM

## 2012-11-15 NOTE — Progress Notes (Signed)
Patient came in for labs only   We will fax results to [phone]  Dr Risa Grill

## 2012-11-16 LAB — BMP8+EGFR
BUN/Creatinine Ratio: 9 (ref 9–20)
CO2: 27 mmol/L (ref 18–29)
Calcium: 8.8 mg/dL (ref 8.7–10.2)
GFR calc non Af Amer: 64 mL/min/{1.73_m2} (ref >59)
Potassium: 4.5 mmol/L (ref 3.5–5.2)
Sodium: 142 mmol/L (ref 134–144)

## 2013-01-03 ENCOUNTER — Ambulatory Visit (INDEPENDENT_AMBULATORY_CARE_PROVIDER_SITE_OTHER): Payer: Medicare Other | Admitting: *Deleted

## 2013-01-03 DIAGNOSIS — Z23 Encounter for immunization: Secondary | ICD-10-CM

## 2013-01-08 DIAGNOSIS — B351 Tinea unguium: Secondary | ICD-10-CM | POA: Diagnosis not present

## 2013-01-08 DIAGNOSIS — M79609 Pain in unspecified limb: Secondary | ICD-10-CM | POA: Diagnosis not present

## 2013-03-19 ENCOUNTER — Encounter: Payer: Self-pay | Admitting: Family Medicine

## 2013-03-19 ENCOUNTER — Ambulatory Visit (INDEPENDENT_AMBULATORY_CARE_PROVIDER_SITE_OTHER): Payer: Medicare Other | Admitting: Family Medicine

## 2013-03-19 VITALS — BP 106/69 | HR 90 | Temp 97.5°F | Ht 62.0 in | Wt 188.0 lb

## 2013-03-19 DIAGNOSIS — H6120 Impacted cerumen, unspecified ear: Secondary | ICD-10-CM

## 2013-03-19 DIAGNOSIS — N400 Enlarged prostate without lower urinary tract symptoms: Secondary | ICD-10-CM | POA: Diagnosis not present

## 2013-03-19 DIAGNOSIS — R5381 Other malaise: Secondary | ICD-10-CM

## 2013-03-19 DIAGNOSIS — E785 Hyperlipidemia, unspecified: Secondary | ICD-10-CM | POA: Diagnosis not present

## 2013-03-19 DIAGNOSIS — H6123 Impacted cerumen, bilateral: Secondary | ICD-10-CM

## 2013-03-19 DIAGNOSIS — L989 Disorder of the skin and subcutaneous tissue, unspecified: Secondary | ICD-10-CM

## 2013-03-19 DIAGNOSIS — R5383 Other fatigue: Secondary | ICD-10-CM

## 2013-03-19 DIAGNOSIS — E559 Vitamin D deficiency, unspecified: Secondary | ICD-10-CM | POA: Diagnosis not present

## 2013-03-19 MED ORDER — CARBAMIDE PEROXIDE 6.5 % OT SOLN: 5.0000 [drp] | Freq: Two times a day (BID) | OTIC | Status: DC

## 2013-03-19 NOTE — Progress Notes (Signed)
   Subjective:    Patient ID: Frank Levine, male    DOB: [birth year], 53 y.o.   MRN: [id number]  HPI  This 53 y.o. male presents for evaluation of routine follow up.  He has noticed a skin lesion on the right ear.  He has family hx of skin cancer (father) and he is concerned this could be skin cancer.  He has Hx of down's syndrome, GERD,bph, and hyperlipidemia.  He has been having some ear wax.  Review of Systems    No chest pain, SOB, HA, dizziness, vision change, N/V, diarrhea, constipation, dysuria, urinary urgency or frequency, myalgias, arthralgias or rash Objective:   Physical Exam  Vital signs noted  Well developed well nourished male.  HEENT - Head atraumatic Normocephalic                Eyes - PERRLA, Conjuctiva - clear Sclera- Clear EOMI                Ears - EAC's partial occluded with cerumenTM's Wnl Gross Hearing WNL                Nose - Nares patent                 Throat - oropharanx wnl Respiratory - Lungs CTA bilateral Cardiac - RRR S1 and S2 without murmur GI - Abdomen soft Nontender and bowel sounds active x 4 Extremities - No edema. Neuro - Grossly intact.      Assessment & Plan:  Hyperlipemia - Plan: Lipid panel, CMP14+EGFR  BPH (benign prostatic hypertrophy) - Plan: PSA, total and free  Fatigue - Plan: POCT CBC, Thyroid Panel With TSH, Vit D  25 hydroxy (rtn osteoporosis monitoring)  Cerumen impaction, bilateral - Plan: carbamide peroxide (DEBROX) 6.5 % otic solution  Skin lesion - Plan: Ambulatory referral to Dermatology  Follow up in 6 months  Lysbeth Penner FNP

## 2013-03-19 NOTE — Patient Instructions (Signed)
Cerumen Impaction A cerumen impaction is when the wax in your ear forms a plug. This plug usually causes reduced hearing. Sometimes it also causes an earache or dizziness. Removing a cerumen impaction can be difficult and painful. The wax sticks to the ear canal. The canal is sensitive and bleeds easily. If you try to remove a heavy wax buildup with a cotton tipped swab, you may push it in further. Irrigation with water, suction, and small ear curettes may be used to clear out the wax. If the impaction is fixed to the skin in the ear canal, ear drops may be needed for a few days to loosen the wax. People who build up a lot of wax frequently can use ear wax removal products available in your local drugstore. SEEK MEDICAL CARE IF:  You develop an earache, increased hearing loss, or marked dizziness. Document Released: 03/31/2004 Document Revised: 05/16/2011 Document Reviewed: 05/21/2009 [employer] Patient Information 2014 [employer].  

## 2013-03-19 NOTE — Addendum Note (Signed)
Addended by: Wyline Mood on: 03/19/2013 09:31 AM   Modules accepted: Orders

## 2013-03-20 LAB — LIPID PANEL
Chol/HDL Ratio: 2.6 ratio units (ref 0.0–5.0)
Cholesterol, Total: 137 mg/dL (ref 100–199)
HDL: 53 mg/dL (ref >39)
LDL Calculated: 70 mg/dL (ref 0–99)
Triglycerides: 69 mg/dL (ref 0–149)
VLDL Cholesterol Cal: 14 mg/dL (ref 5–40)

## 2013-03-20 LAB — CMP14+EGFR
ALT: 15 IU/L (ref 0–44)
AST: 16 IU/L (ref 0–40)
Albumin/Globulin Ratio: 1.2 (ref 1.1–2.5)
Albumin: 3.6 g/dL (ref 3.5–5.5)
Alkaline Phosphatase: 72 IU/L (ref 39–117)
BUN/Creatinine Ratio: 7 — ABNORMAL LOW (ref 9–20)
BUN: 11 mg/dL (ref 6–24)
CO2: 27 mmol/L (ref 18–29)
Calcium: 8.7 mg/dL (ref 8.7–10.2)
Chloride: 103 mmol/L (ref 97–108)
Creatinine, Ser: 1.47 mg/dL — ABNORMAL HIGH (ref 0.76–1.27)
GFR calc Af Amer: 62 mL/min/{1.73_m2} (ref >59)
GFR calc non Af Amer: 54 mL/min/{1.73_m2} — ABNORMAL LOW (ref >59)
Globulin, Total: 2.9 g/dL (ref 1.5–4.5)
Glucose: 94 mg/dL (ref 65–99)
Potassium: 5.1 mmol/L (ref 3.5–5.2)
Sodium: 144 mmol/L (ref 134–144)
Total Bilirubin: 0.5 mg/dL (ref 0.0–1.2)
Total Protein: 6.5 g/dL (ref 6.0–8.5)

## 2013-03-20 LAB — PSA, TOTAL AND FREE
PSA, Free Pct: 20 %
PSA, Free: 0.1 ng/mL
PSA: 0.5 ng/mL (ref 0.0–4.0)

## 2013-03-20 LAB — THYROID PANEL WITH TSH
Free Thyroxine Index: 2.2 (ref 1.2–4.9)
T3 Uptake Ratio: 33 % (ref 24–39)
T4, Total: 6.7 ug/dL (ref 4.5–12.0)
TSH: 2.81 u[IU]/mL (ref 0.450–4.500)

## 2013-03-20 LAB — CBC WITH DIFFERENTIAL
Basophils Absolute: 0.1 10*3/uL (ref 0.0–0.2)
Basos: 1 %
Eos: 2 %
Eosinophils Absolute: 0.1 10*3/uL (ref 0.0–0.4)
HCT: 41.5 % (ref 37.5–51.0)
Hemoglobin: 14.2 g/dL (ref 12.6–17.7)
Immature Grans (Abs): 0 10*3/uL (ref 0.0–0.1)
Immature Granulocytes: 0 %
Lymphocytes Absolute: 0.7 10*3/uL (ref 0.7–3.1)
Lymphs: 14 %
MCH: 34.1 pg — ABNORMAL HIGH (ref 26.6–33.0)
MCHC: 34.2 g/dL (ref 31.5–35.7)
MCV: 100 fL — ABNORMAL HIGH (ref 79–97)
Monocytes Absolute: 0.6 10*3/uL (ref 0.1–0.9)
Monocytes: 11 %
Neutrophils Absolute: 3.5 10*3/uL (ref 1.4–7.0)
Neutrophils Relative %: 72 %
Platelets: 311 10*3/uL (ref 150–379)
RBC: 4.17 x10E6/uL (ref 4.14–5.80)
RDW: 14 % (ref 12.3–15.4)
WBC: 5 10*3/uL (ref 3.4–10.8)

## 2013-03-20 LAB — VITAMIN D 25 HYDROXY (VIT D DEFICIENCY, FRACTURES): Vit D, 25-Hydroxy: 16.7 ng/mL — ABNORMAL LOW (ref 30.0–100.0)

## 2013-03-21 ENCOUNTER — Other Ambulatory Visit: Payer: Self-pay | Admitting: Family Medicine

## 2013-03-21 MED ORDER — VITAMIN D (ERGOCALCIFEROL) 1.25 MG (50000 UNIT) PO CAPS: 50000.0000 [IU] | ORAL_CAPSULE | ORAL | Status: DC

## 2013-03-25 ENCOUNTER — Telehealth: Payer: Self-pay | Admitting: Family Medicine

## 2013-03-25 NOTE — Telephone Encounter (Signed)
Message copied by Waverly Ferrari on Mon Mar 25, 2013  3:48 PM ------      Message from: Lysbeth Penner      Created: Thu Mar 21, 2013  2:36 PM       Vitamin D rx sent to pharm for low vit D, Take MVI otc for elevated MCV ------

## 2013-03-28 DIAGNOSIS — N35919 Unspecified urethral stricture, male, unspecified site: Secondary | ICD-10-CM | POA: Diagnosis not present

## 2013-03-28 DIAGNOSIS — N3020 Other chronic cystitis without hematuria: Secondary | ICD-10-CM | POA: Diagnosis not present

## 2013-03-28 DIAGNOSIS — N1330 Unspecified hydronephrosis: Secondary | ICD-10-CM | POA: Diagnosis not present

## 2013-04-11 DIAGNOSIS — B351 Tinea unguium: Secondary | ICD-10-CM | POA: Diagnosis not present

## 2013-04-11 DIAGNOSIS — M79609 Pain in unspecified limb: Secondary | ICD-10-CM | POA: Diagnosis not present

## 2013-05-08 DIAGNOSIS — D485 Neoplasm of uncertain behavior of skin: Secondary | ICD-10-CM | POA: Diagnosis not present

## 2013-05-08 DIAGNOSIS — L819 Disorder of pigmentation, unspecified: Secondary | ICD-10-CM | POA: Diagnosis not present

## 2013-05-08 DIAGNOSIS — C44211 Basal cell carcinoma of skin of unspecified ear and external auricular canal: Secondary | ICD-10-CM | POA: Diagnosis not present

## 2013-05-23 DIAGNOSIS — C44211 Basal cell carcinoma of skin of unspecified ear and external auricular canal: Secondary | ICD-10-CM | POA: Diagnosis not present

## 2013-06-18 ENCOUNTER — Other Ambulatory Visit (INDEPENDENT_AMBULATORY_CARE_PROVIDER_SITE_OTHER): Payer: Medicare Other

## 2013-06-18 DIAGNOSIS — N289 Disorder of kidney and ureter, unspecified: Secondary | ICD-10-CM

## 2013-06-18 NOTE — Progress Notes (Signed)
Pt came in for labs only 

## 2013-06-19 LAB — BMP8+EGFR
BUN/Creatinine Ratio: 8 — ABNORMAL LOW (ref 9–20)
BUN: 12 mg/dL (ref 6–24)
CALCIUM: 8.7 mg/dL (ref 8.7–10.2)
CHLORIDE: 103 mmol/L (ref 97–108)
CO2: 25 mmol/L (ref 18–29)
Creatinine, Ser: 1.6 mg/dL — ABNORMAL HIGH (ref 0.76–1.27)
GFR calc non Af Amer: 48 mL/min/{1.73_m2} — ABNORMAL LOW (ref >59)
GFR, EST AFRICAN AMERICAN: 56 mL/min/{1.73_m2} — AB (ref >59)
GLUCOSE: 81 mg/dL (ref 65–99)
POTASSIUM: 4.2 mmol/L (ref 3.5–5.2)
Sodium: 141 mmol/L (ref 134–144)

## 2013-06-20 LAB — URINE CULTURE: Organism ID, Bacteria: NO GROWTH

## 2013-06-25 ENCOUNTER — Encounter: Payer: Self-pay | Admitting: *Deleted

## 2013-06-28 ENCOUNTER — Telehealth: Payer: Self-pay | Admitting: Family Medicine

## 2013-06-28 NOTE — Telephone Encounter (Signed)
Please review labs for patient

## 2013-07-02 ENCOUNTER — Other Ambulatory Visit: Payer: Self-pay | Admitting: Urology

## 2013-07-02 NOTE — Telephone Encounter (Signed)
Zigmund Daniel can you call them and give labs

## 2013-07-02 NOTE — Telephone Encounter (Signed)
These results have not been documented on.

## 2013-07-08 ENCOUNTER — Encounter (HOSPITAL_BASED_OUTPATIENT_CLINIC_OR_DEPARTMENT_OTHER): Payer: Self-pay | Admitting: *Deleted

## 2013-07-08 NOTE — Progress Notes (Signed)
SPOKE W/ PT MOTHER, PT HAS DOWN'S SYNDROME.  NPO AFTER MN. ARRIVE AT 0930.  NEEDS HG .  WILL TAKE PROTONIX AM DOS W/ SIPS OF WATER.

## 2013-07-09 DIAGNOSIS — B351 Tinea unguium: Secondary | ICD-10-CM | POA: Diagnosis not present

## 2013-07-09 DIAGNOSIS — M79609 Pain in unspecified limb: Secondary | ICD-10-CM | POA: Diagnosis not present

## 2013-07-10 ENCOUNTER — Ambulatory Visit (HOSPITAL_BASED_OUTPATIENT_CLINIC_OR_DEPARTMENT_OTHER)
Admission: RE | Admit: 2013-07-10 | Discharge: 2013-07-10 | Disposition: A | Discharge status: Home / Self Care | Payer: Medicare Other | Source: Ambulatory Visit | Attending: Urology | Admitting: Urology

## 2013-07-10 ENCOUNTER — Encounter (HOSPITAL_BASED_OUTPATIENT_CLINIC_OR_DEPARTMENT_OTHER): Payer: Medicare Other | Admitting: Anesthesiology

## 2013-07-10 ENCOUNTER — Encounter (HOSPITAL_BASED_OUTPATIENT_CLINIC_OR_DEPARTMENT_OTHER)
Admission: RE | Disposition: A | Discharge status: Home / Self Care | Payer: Self-pay | Source: Ambulatory Visit | Attending: Urology

## 2013-07-10 ENCOUNTER — Ambulatory Visit (HOSPITAL_BASED_OUTPATIENT_CLINIC_OR_DEPARTMENT_OTHER): Payer: Medicare Other | Admitting: Anesthesiology

## 2013-07-10 ENCOUNTER — Encounter (HOSPITAL_BASED_OUTPATIENT_CLINIC_OR_DEPARTMENT_OTHER): Payer: Self-pay | Admitting: Anesthesiology

## 2013-07-10 DIAGNOSIS — N3020 Other chronic cystitis without hematuria: Secondary | ICD-10-CM | POA: Insufficient documentation

## 2013-07-10 DIAGNOSIS — N323 Diverticulum of bladder: Secondary | ICD-10-CM | POA: Diagnosis not present

## 2013-07-10 DIAGNOSIS — E7800 Pure hypercholesterolemia, unspecified: Secondary | ICD-10-CM | POA: Insufficient documentation

## 2013-07-10 DIAGNOSIS — Z79899 Other long term (current) drug therapy: Secondary | ICD-10-CM | POA: Diagnosis not present

## 2013-07-10 DIAGNOSIS — Z7982 Long term (current) use of aspirin: Secondary | ICD-10-CM | POA: Insufficient documentation

## 2013-07-10 DIAGNOSIS — N1330 Unspecified hydronephrosis: Secondary | ICD-10-CM | POA: Diagnosis not present

## 2013-07-10 DIAGNOSIS — Z86718 Personal history of other venous thrombosis and embolism: Secondary | ICD-10-CM | POA: Diagnosis not present

## 2013-07-10 DIAGNOSIS — N135 Crossing vessel and stricture of ureter without hydronephrosis: Secondary | ICD-10-CM | POA: Insufficient documentation

## 2013-07-10 DIAGNOSIS — N35919 Unspecified urethral stricture, male, unspecified site: Secondary | ICD-10-CM | POA: Diagnosis not present

## 2013-07-10 DIAGNOSIS — Q909 Down syndrome, unspecified: Secondary | ICD-10-CM | POA: Insufficient documentation

## 2013-07-10 DIAGNOSIS — N201 Calculus of ureter: Secondary | ICD-10-CM | POA: Diagnosis not present

## 2013-07-10 HISTORY — DX: Personal history of other diseases of the digestive system: Z87.19

## 2013-07-10 HISTORY — DX: Unspecified osteoarthritis, unspecified site: M19.90

## 2013-07-10 HISTORY — DX: Other chronic cystitis without hematuria: N30.20

## 2013-07-10 HISTORY — DX: Personal history of other venous thrombosis and embolism: Z86.718

## 2013-07-10 HISTORY — DX: Gastro-esophageal reflux disease without esophagitis: K21.9

## 2013-07-10 HISTORY — PX: CYSTOGRAM: SHX6285

## 2013-07-10 HISTORY — PX: CYSTOSCOPY WITH URETHRAL DILATATION: SHX5125

## 2013-07-10 HISTORY — DX: Unspecified intellectual disabilities: F79

## 2013-07-10 HISTORY — DX: Anxiety disorder, unspecified: F41.9

## 2013-07-10 HISTORY — DX: Neuromuscular dysfunction of bladder, unspecified: N31.9

## 2013-07-10 HISTORY — DX: Benign prostatic hyperplasia without lower urinary tract symptoms: N40.0

## 2013-07-10 HISTORY — DX: Presence of spectacles and contact lenses: Z97.3

## 2013-07-10 HISTORY — DX: Reserved for concepts with insufficient information to code with codable children: IMO0002

## 2013-07-10 HISTORY — DX: Personal history of pulmonary embolism: Z86.711

## 2013-07-10 LAB — POCT HEMOGLOBIN-HEMACUE: Hemoglobin: 15 g/dL (ref 13.0–17.0)

## 2013-07-10 SURGERY — CYSTOSCOPY, WITH URETHRAL DILATION
Anesthesia: General | Site: Ureter

## 2013-07-10 MED ORDER — STERILE WATER FOR IRRIGATION IR SOLN
Status: DC | PRN
  Administered 2013-07-10: 3000 mL

## 2013-07-10 MED ORDER — DEXAMETHASONE SODIUM PHOSPHATE 4 MG/ML IJ SOLN
INTRAMUSCULAR | Status: DC | PRN
  Administered 2013-07-10: 10 mg via INTRAVENOUS

## 2013-07-10 MED ORDER — MIDAZOLAM HCL 5 MG/5ML IJ SOLN
INTRAMUSCULAR | Status: DC | PRN
  Administered 2013-07-10: 1 mg via INTRAVENOUS

## 2013-07-10 MED ORDER — PROPOFOL 10 MG/ML IV BOLUS
INTRAVENOUS | Status: DC | PRN
  Administered 2013-07-10: 200 mg via INTRAVENOUS

## 2013-07-10 MED ORDER — KETOROLAC TROMETHAMINE 30 MG/ML IJ SOLN
INTRAMUSCULAR | Status: DC | PRN
  Administered 2013-07-10: 30 mg via INTRAVENOUS

## 2013-07-10 MED ORDER — MIDAZOLAM HCL 2 MG/2ML IJ SOLN
INTRAMUSCULAR | Status: AC
  Filled 2013-07-10: qty 2

## 2013-07-10 MED ORDER — FENTANYL CITRATE 0.05 MG/ML IJ SOLN
INTRAMUSCULAR | Status: DC | PRN
  Administered 2013-07-10: 50 ug via INTRAVENOUS

## 2013-07-10 MED ORDER — LIDOCAINE HCL 2 % EX GEL
CUTANEOUS | Status: DC | PRN
  Administered 2013-07-10: 1 via URETHRAL

## 2013-07-10 MED ORDER — LIDOCAINE HCL (CARDIAC) 20 MG/ML IV SOLN
INTRAVENOUS | Status: DC | PRN
  Administered 2013-07-10: 80 mg via INTRAVENOUS

## 2013-07-10 MED ORDER — IOHEXOL 350 MG/ML SOLN
INTRAVENOUS | Status: DC | PRN
  Administered 2013-07-10: 20 mL

## 2013-07-10 MED ORDER — ONDANSETRON HCL 4 MG/2ML IJ SOLN
INTRAMUSCULAR | Status: DC | PRN
  Administered 2013-07-10: 4 mg via INTRAVENOUS

## 2013-07-10 MED ORDER — ACETAMINOPHEN 10 MG/ML IV SOLN
INTRAVENOUS | Status: DC | PRN
  Administered 2013-07-10: 1000 mg via INTRAVENOUS

## 2013-07-10 MED ORDER — HYDROCODONE-ACETAMINOPHEN 5-325 MG PO TABS: 1.0000 | ORAL_TABLET | Freq: Four times a day (QID) | ORAL | Status: DC | PRN

## 2013-07-10 MED ORDER — CEFAZOLIN SODIUM-DEXTROSE 2-3 GM-% IV SOLR
2.0000 g | INTRAVENOUS | Status: AC
  Administered 2013-07-10: 2 g via INTRAVENOUS
  Filled 2013-07-10: qty 50

## 2013-07-10 MED ORDER — FENTANYL CITRATE 0.05 MG/ML IJ SOLN
INTRAMUSCULAR | Status: AC
  Filled 2013-07-10: qty 2

## 2013-07-10 MED ORDER — DIATRIZOATE MEGLUMINE 30 % UR SOLN
URETHRAL | Status: DC | PRN
  Administered 2013-07-10: 300 mL

## 2013-07-10 MED ORDER — LACTATED RINGERS IV SOLN
INTRAVENOUS | Status: DC
  Administered 2013-07-10 (×2): via INTRAVENOUS
  Filled 2013-07-10: qty 1000

## 2013-07-10 SURGICAL SUPPLY — 40 items
BAG DRAIN URO-CYSTO SKYTR STRL (DRAIN) ×3 IMPLANT
BAG DRN ANRFLXCHMBR STRAP LEK (BAG) ×2
BAG DRN UROCATH (DRAIN) ×2
BAG URINE DRAINAGE (UROLOGICAL SUPPLIES) IMPLANT
BAG URINE LEG 19OZ MD ST LTX (BAG) ×1 IMPLANT
BAG URINE LEG 500ML (DRAIN) IMPLANT
BALLN NEPHROSTOMY (BALLOONS) ×6
BALLOON NEPHROSTOMY (BALLOONS) ×2 IMPLANT
CANISTER SUCT LVC 12 LTR MEDI- (MISCELLANEOUS) ×1 IMPLANT
CATH FOLEY 2WAY SLVR  5CC 16FR (CATHETERS)
CATH FOLEY 2WAY SLVR  5CC 18FR (CATHETERS) ×1
CATH FOLEY 2WAY SLVR  5CC 20FR (CATHETERS)
CATH FOLEY 2WAY SLVR 5CC 16FR (CATHETERS) IMPLANT
CATH FOLEY 2WAY SLVR 5CC 18FR (CATHETERS) IMPLANT
CATH FOLEY 2WAY SLVR 5CC 20FR (CATHETERS) IMPLANT
CATH URET 5FR 28IN CONE TIP (BALLOONS) ×1
CATH URET 5FR 70CM CONE TIP (BALLOONS) IMPLANT
CLOTH BEACON ORANGE TIMEOUT ST (SAFETY) ×3 IMPLANT
DRAPE CAMERA CLOSED 9X96 (DRAPES) ×3 IMPLANT
ELECT REM PT RETURN 9FT ADLT (ELECTROSURGICAL) ×3
ELECTRODE REM PT RTRN 9FT ADLT (ELECTROSURGICAL) ×2 IMPLANT
GLOVE BIO SURGEON STRL SZ7.5 (GLOVE) ×3 IMPLANT
GLOVE BIOGEL M STER SZ 6 (GLOVE) ×1 IMPLANT
GLOVE BIOGEL PI IND STRL 6.5 (GLOVE) IMPLANT
GLOVE BIOGEL PI INDICATOR 6.5 (GLOVE) ×2
GOWN STRL REIN XL XLG (GOWN DISPOSABLE) ×2 IMPLANT
GOWN STRL REUS W/TWL LRG LVL3 (GOWN DISPOSABLE) ×1 IMPLANT
GOWN STRL REUS W/TWL XL LVL3 (GOWN DISPOSABLE) ×1 IMPLANT
GUIDEWIRE STR DUAL SENSOR (WIRE) ×1 IMPLANT
HOLDER FOLEY CATH W/STRAP (MISCELLANEOUS) IMPLANT
NDL SAFETY ECLIPSE 18X1.5 (NEEDLE) IMPLANT
NEEDLE HYPO 18GX1.5 SHARP (NEEDLE)
NEEDLE HYPO 22GX1.5 SAFETY (NEEDLE) IMPLANT
NS IRRIG 500ML POUR BTL (IV SOLUTION) IMPLANT
PACK CYSTOSCOPY (CUSTOM PROCEDURE TRAY) ×3 IMPLANT
SYR 20CC LL (SYRINGE) IMPLANT
SYR 30ML LL (SYRINGE) IMPLANT
SYR BULB IRRIGATION 50ML (SYRINGE) ×1 IMPLANT
SYR CONTROL 10ML LL (SYRINGE) ×1 IMPLANT
WATER STERILE IRR 3000ML UROMA (IV SOLUTION) ×3 IMPLANT

## 2013-07-10 NOTE — Discharge Instructions (Signed)

## 2013-07-10 NOTE — H&P (Signed)
Reason for visit: Frank Levine presents today for repeat cystoscopy with dilation of urethral stricture and catheter placement. He has known history of bilateral hydronephrosis increasing creatinine has increased. We felt was important to perform a repeat endoscopic assessment. He presents now for that as an outpatient.     History of Present Illness  Frank Levine returns for routine followup. His last several notes will outline his complicated history. In essence, he has had chronic bilateral hydronephrosis with severe ureteral tortuosity. Some of that is felt to potentially be some extrinsic compression of his intramural ureter due to bladder wall thickening. He has also had recurrent urethral stricture disease with an elevated postvoid residual. When we initially assessed him several years ago, creatinine was close to 2.0. After assessment and treatment, his creatinine improved and was fairly stable at 1.1. More recently his creatinine has crept up to 1.4 to 1.5 range. It was 1.4 when checked here in July and recently was 1.47 when checked at his primary care physician's office. Frank Levine has also had some recurrent cystitis thought to be related to incomplete emptying. Last time he was here urine was clear, but today's urine does show white cells. His mom reports that he has complained of dysuria on one occasion recently but otherwise has not been obviously ill or having any difficulties.  Repeat renal ultrasonography was again performed today. Full findings recorded on the worksheet and films were reviewed and compared to previous images. The patient continues to have fairly severe hydro on the right with at least moderate hydro of the left kidney. It does not appear that there has been a dramatic change. He continues to have incomplete emptying with a postvoid residual today of around 250 cc, which includes the bladder and a large diverticulum.     Past Medical History Problems  1. History of  hypercholesterolemia (V12.29) 2. History of Venous Thrombosis Of The Deep Vessels Of The Lower Extremity (453.40)  Surgical History Problems  1. History of Cystoscopy With Biopsy 2. History of Cystoscopy With Insertion Of Ureteral Stent Bilateral 3. History of Cystoscopy With Insertion Of Ureteral Stent Left 4. History of Cystoscopy With Insertion Of Ureteral Stent Right 5. History of Cystoscopy With Ureteroscopy Left 6. History of Cystoscopy With Ureteroscopy Left 7. History of Cystoscopy With Ureteroscopy Left 8. History of Cystoscopy With Ureteroscopy Left 9. History of Eye Surgery  Current Meds 1. Aspirin 81 MG Oral Tablet; 1 per day;  Therapy: (Recorded:17Jul2012) to Recorded 2. Iron TABS; 65 mg;1 tablet daily;  Therapy: (Recorded:17Jul2012) to Recorded 3. Nitrofurantoin Macrocrystal 50 MG Oral Capsule; TAKE ONE CAPSULE BY MOUTH AT  BEDTIME;  Therapy: [id number] to (Last Rx:31Dec2014)  Requested for: [id number] Ordered 4. Pantoprazole Sodium 40 MG Oral Tablet Delayed Release;  Therapy: [id number] to Recorded 5. Pataday 0.2 % Ophthalmic Solution;  Therapy: [id number] to Recorded 6. Simvastatin 40 MG Oral Tablet; 1at night;  Therapy: (Recorded:20Mar2012) to Recorded 7. Stool Softener CAPS; 1 per day;  Therapy: (Recorded:20Mar2012) to Recorded 8. Tamsulosin HCl - 0.4 MG Oral Capsule; TAKE ONE CAPSULE BY MOUTH ONE TIME  DAILY;  Therapy: [id number] to (Last Rx:13Jan2015)  Requested for: [id number] Ordered 9. Vitamin D (Ergocalciferol) 50000 UNIT Oral Capsule;  Therapy: (Recorded:22Jan2015) to Recorded  Allergies Medication  1. No Known Drug Allergies  Family History Problems  1. Family history of Esophageal Cancer (V16.0) : Mother 2. Family history of Family Health Status - Father's Age : Mother   40 3. Family history of Family Health Status - Mother's Age :  Mother   71 4. Family history of Hypertension (V17.49)  Social History Problems  1. Denied: History of Alcohol  Use 2. Caffeine Use 3. Marital History - Single 4. Never A Smoker 5. Denied: History of Tobacco Use  Review of Systems Genitourinary, constitutional, skin, eye, otolaryngeal, hematologic/lymphatic, cardiovascular, pulmonary, endocrine, musculoskeletal, gastrointestinal, neurological and psychiatric system(s) were reviewed and pertinent findings if present are noted.  Genitourinary: urinary frequency, feelings of urinary urgency, dysuria and nocturia, but no hematuria and no penile pain.  Gastrointestinal: constipation, but no flank pain and no abdominal pain.  Respiratory: no shortness of breath, no cough and no shortness of breath during exertion.    Vitals Vital Signs [Data Includes: Last 1 Day]  Blood Pressure: 95 / 64 Temperature: 98.1 F Heart Rate: 24  Well-developed well-nourished male in no acute distress. Respiratory: Normal effort Cardiac: Regular rate and rhythm Abdomen: Soft nontender Extremities: No obvious asymmetry/edema  Results/Data Urine [Data Includes: Last 1 Day]   22Jan2015 COLOR YELLOW  APPEARANCE CLOUDY  SPECIFIC GRAVITY 1.010  pH 7.0  GLUCOSE NEG mg/dL BILIRUBIN NEG  KETONE NEG mg/dL BLOOD SMALL  PROTEIN NEG mg/dL UROBILINOGEN 0.2 mg/dL NITRITE NEG  LEUKOCYTE ESTERASE LARGE  SQUAMOUS EPITHELIAL/HPF NONE SEEN  WBC TNTC WBC/hpf RBC 3-6 RBC/hpf BACTERIA MODERATE  CRYSTALS NONE SEEN  CASTS NONE SEEN   Assessment Assessed  1. Hydronephrosis (591) 2. Chronic cystitis (595.2)  Plan  Health Maintenance  1. UA With REFLEX; [Do Not Release]; Status:Complete;   [id number] 07:58AM Hydronephrosis  2. RENAL U/S COMPLETE; Status:Hold For - Date of Service; Requested for:23Jul2015;  3. Follow-up Month x 6 Office  Follow-up  Status: Hold For - Date of Service  Requested for:  23Jul2015  URINE CULTURE; Status:In Progress - Specimen/Data Collected;  Done: 22Jan2015 Perform:[facility]; [id number]; Marked Important; Last Updated [id number], Santiago Glad;  03/28/2013 9:12:57 AM;Ordered; Today;  [id number] cystitis; Ordered [id number], Shanon Brow;   Discussion/Summary   Clinically Frank Levine continues to do surprisingly well. He is really without complaints and has only had some minimal dysuria recently. Since he is relatively asymptomatic, I do not want to put him on antibiotics until I see what his culture results show. We will then stop his daily prophylaxis, treat him for a week, and then restart the prophylaxis. At this point I think we will continue to monitor his hydro and renal function. I will ask his mom to bring him to [place] for a urine culture and BMET in approximately 3 months and have those results faxed to me. I will then plan on seeing him personally for followup in 6 months and sooner if necessary.

## 2013-07-10 NOTE — Interval H&P Note (Signed)
History and Physical Interval Note:  07/10/2013 10:40 AM  [name]  has presented today for surgery, with the diagnosis of Impacted Left Ureteral Stone  The various methods of treatment have been discussed with the patient and family. After consideration of risks, benefits and other options for treatment, the patient has consented to  Procedure(s): CYSTOSCOPY WITH URETHRAL DILATATION (N/A) CYSTOGRAM (N/A) as a surgical intervention .  The patient's history has been reviewed, patient examined, no change in status, stable for surgery.  I have reviewed the patient's chart and labs.  Questions were answered to the patient's satisfaction.     Bernestine Amass

## 2013-07-10 NOTE — Op Note (Signed)
Preoperative diagnosis: Recurrent bulbar urethral stricture disease, bilateral hydronephrosis Postoperative diagnosis: Same  Procedure: Cystoscopy, balloon dilation of urethral stricture, cystogram, right retrograde pyelogram   Surgeon: Bernestine Amass M.D.  Anesthesia: Gen.  Indications: Frank Levine is a 53 year old male with Down's syndrome. He has had a somewhat complicated history over the last several years. He was found initially to have significant renal insufficiency with severe bilateral hydronephrosis and severe ureteral tortuosity. He was found to have significant postvoid residual and bulbar urethral stricture disease which was dilated. There was a possible concern over some extrinsic compression of his intermural ureter due to bladder wall thickening. He has had a number of procedures over the years. His creatinine did eventually improved to 1.1 from a high of approximately 2.0. Recently his creatinine has been increasing again. There was evidence of increasing postvoid residual he presents today for reassessment.     Technique and findings: Patient was brought the operating room where he had successful induction general anesthesia. He was placed in lithotomy position prepped and draped in usual manner. He did receive perioperative antibiotics. Appropriate surgical timeout was performed. Cystoscopy again revealed a fairly tight bulbar urethral stricture estimated at around 6-8 [place]. Guidewire was placed through this area and into the bladder with fluoroscopy. A 24 French fascial dilating balloon was then used to dilate the stricture. Cystoscopy again revealed 4+ bladder trabeculation with a large mouth diverticula in the right posterior lateral aspect of the bladder. Cystogram was performed. Evidence of clear high-grade left sided reflux. No dye was noted on the right side. Retrograde pyelogram was done of the right ureter with fluoroscopic interpretation. Again a markedly dilated and tortuous  ureter was noted. There did not appear to be obvious extrinsic compression of the intermural ureter but it's difficult to rule out conclusively. I believe his markedly dilated and tortuous ureters are the result of at least reflux on the left side as well as long-standing bladder residual/high pressure bladder. A Foley catheter was inserted which will be attached to a leg bag. The patient no obvious complications and was brought to recovery in stable condition.

## 2013-07-10 NOTE — Anesthesia Procedure Notes (Signed)
Procedure Name: LMA Insertion Date/Time: 07/10/2013 10:40 AM Performed by: Bethena Roys T Pre-anesthesia Checklist: Patient identified, Emergency Drugs available, Suction available and Patient being monitored Patient Re-evaluated:Patient Re-evaluated prior to inductionOxygen Delivery Method: Circle System Utilized Preoxygenation: Pre-oxygenation with 100% oxygen Intubation Type: IV induction Ventilation: Mask ventilation without difficulty LMA: LMA inserted LMA Size: 5.0 Number of attempts: 1 Airway Equipment and Method: bite block Placement Confirmation: positive ETCO2 Dental Injury: Teeth and Oropharynx as per pre-operative assessment

## 2013-07-10 NOTE — Transfer of Care (Signed)
Immediate Anesthesia Transfer of Care Note  Patient: Frank Levine  Procedure(s) Performed: Procedure(s): CYSTOSCOPY WITH URETHRAL DILATATION, BALLOON DILITATION, RIGHT  RETROGRADE (N/A) CYSTOGRAM (N/A)  Patient Location: PACU  Anesthesia Type:General  Level of Consciousness: sedated and responds to stimulation  Airway & Oxygen Therapy: Patient Spontanous Breathing and Patient connected to nasal cannula oxygen  Post-op Assessment: Report given to PACU RN  Post vital signs: Reviewed and stable  Complications: No apparent anesthesia complications

## 2013-07-10 NOTE — Anesthesia Preprocedure Evaluation (Addendum)
Anesthesia Evaluation  Patient identified by MRN, date of birth, ID band Patient awake    Reviewed: Allergy & Precautions, H&P , NPO status , Patient's Chart, lab work & pertinent test results  Airway Mallampati: III TM Distance: >3 FB Neck ROM: Full    Dental  (+) Dental Advisory Given   Pulmonary PE         Cardiovascular + Peripheral Vascular Disease Rhythm:Regular Rate:Normal     Neuro/Psych PSYCHIATRIC DISORDERS Anxiety High functioning Downs Syndromenegative neurological ROS     GI/Hepatic Neg liver ROS, GERD-  Medicated,  Endo/Other  negative endocrine ROS  Renal/GU CRF and Renal InsufficiencyRenal disease     Musculoskeletal negative musculoskeletal ROS (+)   Abdominal (+) + obese,   Peds  Hematology negative hematology ROS (+)   Anesthesia Other Findings   Reproductive/Obstetrics                          Anesthesia Physical Anesthesia Plan  ASA: III  Anesthesia Plan: General   Post-op Pain Management:    Induction: Intravenous  Airway Management Planned: LMA  Additional Equipment:   Intra-op Plan:   Post-operative Plan: Extubation in OR  Informed Consent: I have reviewed the patients History and Physical, chart, labs and discussed the procedure including the risks, benefits and alternatives for the proposed anesthesia with the patient or authorized representative who has indicated his/her understanding and acceptance.   Dental advisory given  Plan Discussed with: CRNA  Anesthesia Plan Comments:         Anesthesia Quick Evaluation

## 2013-07-11 ENCOUNTER — Encounter (HOSPITAL_BASED_OUTPATIENT_CLINIC_OR_DEPARTMENT_OTHER): Payer: Self-pay | Admitting: Urology

## 2013-07-11 NOTE — Anesthesia Postprocedure Evaluation (Signed)
Anesthesia Post Note  Patient: Frank Levine  Procedure(s) Performed: Procedure(s) (LRB): CYSTOSCOPY WITH URETHRAL DILATATION, BALLOON DILITATION, RIGHT  RETROGRADE (N/A) CYSTOGRAM (N/A)  Anesthesia type: General  Patient location: PACU  Post pain: Pain level controlled  Post assessment: Post-op Vital signs reviewed  Last Vitals: BP 109/71  Pulse 85  Temp(Src) 36.1 C (Oral)  Resp 14  Ht 5\' 5"  (1.651 m)  Wt 189 lb (85.73 kg)  BMI 31.45 kg/m2  SpO2 98%  Post vital signs: Reviewed  Level of consciousness: sedated  Complications: No apparent anesthesia complications

## 2013-07-22 DIAGNOSIS — N35919 Unspecified urethral stricture, male, unspecified site: Secondary | ICD-10-CM | POA: Diagnosis not present

## 2013-07-22 DIAGNOSIS — R339 Retention of urine, unspecified: Secondary | ICD-10-CM | POA: Diagnosis not present

## 2013-07-22 DIAGNOSIS — N1330 Unspecified hydronephrosis: Secondary | ICD-10-CM | POA: Diagnosis not present

## 2013-08-08 DIAGNOSIS — N35919 Unspecified urethral stricture, male, unspecified site: Secondary | ICD-10-CM | POA: Diagnosis not present

## 2013-08-08 DIAGNOSIS — N1330 Unspecified hydronephrosis: Secondary | ICD-10-CM | POA: Diagnosis not present

## 2013-08-08 DIAGNOSIS — R339 Retention of urine, unspecified: Secondary | ICD-10-CM | POA: Diagnosis not present

## 2013-08-08 DIAGNOSIS — N3020 Other chronic cystitis without hematuria: Secondary | ICD-10-CM | POA: Diagnosis not present

## 2013-09-03 DIAGNOSIS — G44209 Tension-type headache, unspecified, not intractable: Secondary | ICD-10-CM | POA: Diagnosis not present

## 2013-09-03 DIAGNOSIS — H1045 Other chronic allergic conjunctivitis: Secondary | ICD-10-CM | POA: Diagnosis not present

## 2013-09-09 ENCOUNTER — Other Ambulatory Visit: Payer: Self-pay | Admitting: Family Medicine

## 2013-09-23 ENCOUNTER — Other Ambulatory Visit: Payer: Self-pay | Admitting: Family Medicine

## 2013-09-24 ENCOUNTER — Ambulatory Visit: Payer: Self-pay | Admitting: Family Medicine

## 2013-09-25 ENCOUNTER — Ambulatory Visit (INDEPENDENT_AMBULATORY_CARE_PROVIDER_SITE_OTHER): Payer: Medicare Other | Admitting: Family Medicine

## 2013-09-25 ENCOUNTER — Encounter: Payer: Self-pay | Admitting: Family Medicine

## 2013-09-25 ENCOUNTER — Other Ambulatory Visit: Payer: Self-pay | Admitting: Family Medicine

## 2013-09-25 VITALS — BP 116/67 | HR 93 | Temp 99.1°F | Ht 65.0 in | Wt 182.6 lb

## 2013-09-25 DIAGNOSIS — K2100 Gastro-esophageal reflux disease with esophagitis, without bleeding: Secondary | ICD-10-CM | POA: Diagnosis not present

## 2013-09-25 DIAGNOSIS — E559 Vitamin D deficiency, unspecified: Secondary | ICD-10-CM | POA: Diagnosis not present

## 2013-09-25 DIAGNOSIS — E785 Hyperlipidemia, unspecified: Secondary | ICD-10-CM | POA: Diagnosis not present

## 2013-09-25 DIAGNOSIS — N400 Enlarged prostate without lower urinary tract symptoms: Secondary | ICD-10-CM | POA: Diagnosis not present

## 2013-09-25 DIAGNOSIS — N390 Urinary tract infection, site not specified: Secondary | ICD-10-CM

## 2013-09-25 DIAGNOSIS — R319 Hematuria, unspecified: Principal | ICD-10-CM

## 2013-09-25 LAB — POCT URINALYSIS DIPSTICK
Bilirubin, UA: NEGATIVE
Glucose, UA: NEGATIVE
Ketones, UA: NEGATIVE
Nitrite, UA: POSITIVE
Spec Grav, UA: 1.01
Urobilinogen, UA: NEGATIVE
pH, UA: 5

## 2013-09-25 LAB — POCT UA - MICROSCOPIC ONLY
Casts, Ur, LPF, POC: NEGATIVE
Crystals, Ur, HPF, POC: NEGATIVE
Mucus, UA: NEGATIVE
Yeast, UA: NEGATIVE

## 2013-09-25 LAB — POCT CBC
Granulocyte percent: 81.1 %G — AB (ref 37–80)
HCT, POC: 41 % — AB (ref 43.5–53.7)
Hemoglobin: 13.5 g/dL — AB (ref 14.1–18.1)
Lymph, poc: 0.8 (ref 0.6–3.4)
MCH, POC: 32.5 pg — AB (ref 27–31.2)
MCHC: 32.9 g/dL (ref 31.8–35.4)
MCV: 98.8 fL — AB (ref 80–97)
MPV: 8.1 fL (ref 0–99.8)
POC Granulocyte: 4.9 (ref 2–6.9)
Platelet Count, POC: 244 10*3/uL (ref 142–424)
RBC: 4.2 M/uL — AB (ref 4.69–6.13)
RDW, POC: 14.5 %
WBC: 6 10*3/uL (ref 4.6–10.2)

## 2013-09-25 MED ORDER — PANTOPRAZOLE SODIUM 40 MG PO TBEC: 40.0000 mg | DELAYED_RELEASE_TABLET | Freq: Every day | ORAL | Status: DC

## 2013-09-25 MED ORDER — CIPROFLOXACIN HCL 500 MG PO TABS: 500.0000 mg | ORAL_TABLET | Freq: Two times a day (BID) | ORAL | Status: DC

## 2013-09-25 MED ORDER — TAMSULOSIN HCL 0.4 MG PO CAPS: 0.4000 mg | ORAL_CAPSULE | Freq: Every day | ORAL | Status: DC

## 2013-09-25 MED ORDER — SIMVASTATIN 40 MG PO TABS: 40.0000 mg | ORAL_TABLET | Freq: Every day | ORAL | Status: DC

## 2013-09-25 NOTE — Addendum Note (Signed)
Addended by: Selmer Dominion on: 09/25/2013 10:36 AM   Modules accepted: Orders

## 2013-09-25 NOTE — Progress Notes (Signed)
   Subjective:    Patient ID: Frank Levine, male    DOB: 09-Aug-1960, 53 y.o.   MRN: [id number]  HPI This 53 y.o. male presents for evaluation of routine follow up.  He has hx of Down's syndrome. He has hx of gerd and bph.   Review of Systems No chest pain, SOB, HA, dizziness, vision change, N/V, diarrhea, constipation, dysuria, urinary urgency or frequency, myalgias, arthralgias or rash.     Objective:   Physical Exam Vital signs noted  Well developed well nourished male.  HEENT - Head atraumatic Normocephalic                Eyes - PERRLA, Conjuctiva - clear Sclera- Clear EOMI                Ears - EAC's Wnl TM's Wnl Gross Hearing WNL                Nose - Nares patent                 Throat - oropharanx wnl Respiratory - Lungs CTA bilateral Cardiac - RRR S1 and S2 without murmur GI - Abdomen soft Nontender and bowel sounds active x 4 Extremities - No edema. Neuro - Grossly intact.       Assessment & Plan:  BPH (benign prostatic hyperplasia) - Plan: tamsulosin (FLOMAX) 0.4 MG CAPS capsule, POCT UA - Microscopic Only, POCT urinalysis dipstick  Other and unspecified hyperlipidemia - Plan: simvastatin (ZOCOR) 40 MG tablet, POCT CBC, CMP14+EGFR, Lipid panel  Gastroesophageal reflux disease with esophagitis - Plan: pantoprazole (PROTONIX) 40 MG tablet  Unspecified vitamin D deficiency - Plan: Vit D  25 hydroxy (rtn osteoporosis monitoring)  Lysbeth Penner FNP

## 2013-09-26 ENCOUNTER — Other Ambulatory Visit: Payer: Self-pay | Admitting: Family Medicine

## 2013-09-26 LAB — SPECIMEN STATUS REPORT

## 2013-09-26 LAB — CMP14+EGFR
ALT: 14 IU/L (ref 0–44)
AST: 18 IU/L (ref 0–40)
Albumin/Globulin Ratio: 1.2 (ref 1.1–2.5)
Albumin: 3.7 g/dL (ref 3.5–5.5)
Alkaline Phosphatase: 78 IU/L (ref 39–117)
BUN/Creatinine Ratio: 6 — ABNORMAL LOW (ref 9–20)
BUN: 10 mg/dL (ref 6–24)
CO2: 24 mmol/L (ref 18–29)
Calcium: 9 mg/dL (ref 8.7–10.2)
Chloride: 103 mmol/L (ref 97–108)
Creatinine, Ser: 1.61 mg/dL — ABNORMAL HIGH (ref 0.76–1.27)
GFR calc Af Amer: 55 mL/min/{1.73_m2} — ABNORMAL LOW (ref >59)
GFR calc non Af Amer: 48 mL/min/{1.73_m2} — ABNORMAL LOW (ref >59)
Globulin, Total: 3.2 g/dL (ref 1.5–4.5)
Glucose: 98 mg/dL (ref 65–99)
Potassium: 4.6 mmol/L (ref 3.5–5.2)
Sodium: 143 mmol/L (ref 134–144)
Total Bilirubin: 0.5 mg/dL (ref 0.0–1.2)
Total Protein: 6.9 g/dL (ref 6.0–8.5)

## 2013-09-26 LAB — LIPID PANEL
Chol/HDL Ratio: 3 ratio units (ref 0.0–5.0)
Cholesterol, Total: 163 mg/dL (ref 100–199)
HDL: 54 mg/dL (ref >39)
LDL Calculated: 88 mg/dL (ref 0–99)
Triglycerides: 103 mg/dL (ref 0–149)
VLDL Cholesterol Cal: 21 mg/dL (ref 5–40)

## 2013-09-26 LAB — URINE CULTURE

## 2013-09-26 LAB — VITAMIN D 25 HYDROXY (VIT D DEFICIENCY, FRACTURES): Vit D, 25-Hydroxy: 25 ng/mL — ABNORMAL LOW (ref 30.0–100.0)

## 2013-09-26 MED ORDER — VITAMIN D (ERGOCALCIFEROL) 1.25 MG (50000 UNIT) PO CAPS: 50000.0000 [IU] | ORAL_CAPSULE | ORAL | Status: DC

## 2013-10-08 DIAGNOSIS — M79609 Pain in unspecified limb: Secondary | ICD-10-CM | POA: Diagnosis not present

## 2013-10-08 DIAGNOSIS — B351 Tinea unguium: Secondary | ICD-10-CM | POA: Diagnosis not present

## 2013-11-19 DIAGNOSIS — D485 Neoplasm of uncertain behavior of skin: Secondary | ICD-10-CM | POA: Diagnosis not present

## 2013-11-19 DIAGNOSIS — Z85828 Personal history of other malignant neoplasm of skin: Secondary | ICD-10-CM | POA: Diagnosis not present

## 2013-12-10 DIAGNOSIS — Z23 Encounter for immunization: Secondary | ICD-10-CM | POA: Diagnosis not present

## 2014-01-07 DIAGNOSIS — M79676 Pain in unspecified toe(s): Secondary | ICD-10-CM | POA: Diagnosis not present

## 2014-01-07 DIAGNOSIS — B351 Tinea unguium: Secondary | ICD-10-CM | POA: Diagnosis not present

## 2014-01-27 ENCOUNTER — Encounter: Payer: Self-pay | Admitting: *Deleted

## 2014-01-28 ENCOUNTER — Other Ambulatory Visit: Payer: Self-pay | Admitting: Family Medicine

## 2014-02-13 DIAGNOSIS — R339 Retention of urine, unspecified: Secondary | ICD-10-CM | POA: Diagnosis not present

## 2014-02-13 DIAGNOSIS — N3020 Other chronic cystitis without hematuria: Secondary | ICD-10-CM | POA: Diagnosis not present

## 2014-02-13 DIAGNOSIS — N1330 Unspecified hydronephrosis: Secondary | ICD-10-CM | POA: Diagnosis not present

## 2014-02-13 DIAGNOSIS — N359 Urethral stricture, unspecified: Secondary | ICD-10-CM | POA: Diagnosis not present

## 2014-04-01 ENCOUNTER — Ambulatory Visit: Payer: Medicare Other | Admitting: Family Medicine

## 2014-04-01 ENCOUNTER — Ambulatory Visit (INDEPENDENT_AMBULATORY_CARE_PROVIDER_SITE_OTHER): Payer: Medicare Other | Admitting: Family

## 2014-04-01 ENCOUNTER — Encounter: Payer: Self-pay | Admitting: Family

## 2014-04-01 VITALS — BP 96/61 | HR 76 | Temp 96.5°F | Ht 62.0 in | Wt 182.4 lb

## 2014-04-01 DIAGNOSIS — K219 Gastro-esophageal reflux disease without esophagitis: Secondary | ICD-10-CM

## 2014-04-01 DIAGNOSIS — Z1321 Encounter for screening for nutritional disorder: Secondary | ICD-10-CM | POA: Diagnosis not present

## 2014-04-01 DIAGNOSIS — Z23 Encounter for immunization: Secondary | ICD-10-CM

## 2014-04-01 DIAGNOSIS — E785 Hyperlipidemia, unspecified: Secondary | ICD-10-CM | POA: Diagnosis not present

## 2014-04-01 DIAGNOSIS — Z299 Encounter for prophylactic measures, unspecified: Secondary | ICD-10-CM

## 2014-04-01 DIAGNOSIS — E559 Vitamin D deficiency, unspecified: Secondary | ICD-10-CM | POA: Diagnosis not present

## 2014-04-01 NOTE — Progress Notes (Signed)
Subjective:    Patient ID: Frank Levine, male    DOB: [birth year], 54 y.o.   MRN: [id number]  Hyperlipidemia This is a chronic problem. The current episode started more than 1 year ago. The problem is controlled. Recent lipid tests were reviewed and are normal. Exacerbating diseases include obesity. He has no history of diabetes or hypothyroidism. Factors aggravating his hyperlipidemia include fatty foods. Pertinent negatives include no leg pain, myalgias or shortness of breath. Current antihyperlipidemic treatment includes statins. The current treatment provides moderate improvement of lipids. Risk factors for coronary artery disease include dyslipidemia, hypertension, male sex and obesity.  Gastrophageal Reflux He reports no belching, no coughing, no dysphagia, no heartburn, no nausea, no sore throat or no wheezing. This is a chronic problem. The current episode started more than 1 year ago. The problem occurs rarely. The problem has been resolved. The symptoms are aggravated by lying down and certain foods. He has tried a PPI for the symptoms. The treatment provided significant relief.  Benign Prostatic Hypertrophy This is a chronic problem. The current episode started more than 1 year ago. The problem has been waxing and waning since onset. Irritative symptoms do not include nocturia. Obstructive symptoms do not include a slower stream. Associated symptoms include hesitancy. Pertinent negatives include no nausea. He is not sexually active. Past treatments include tamsulosin. The treatment provided moderate relief.   Pt has "bladder thickness" and he sees an urologists every 6 months.    Review of Systems  Constitutional: Negative.   HENT: Negative.  Negative for sore throat.   Respiratory: Negative.  Negative for cough, shortness of breath and wheezing.   Cardiovascular: Negative.   Gastrointestinal: Negative.  Negative for heartburn, dysphagia and nausea.  Endocrine: Negative.     Genitourinary: Positive for hesitancy. Negative for nocturia.  Musculoskeletal: Negative.  Negative for myalgias.  Neurological: Negative.   Hematological: Negative.   Psychiatric/Behavioral: Negative.   All other systems reviewed and are negative.      Objective:   Physical Exam  Constitutional: He is oriented to person, place, and time. He appears well-developed and well-nourished. No distress.  HENT:  Head: Normocephalic.  Right Ear: External ear normal.  Left Ear: External ear normal.  Mouth/Throat: Oropharynx is clear and moist.  Eyes: Pupils are equal, round, and reactive to light. Right eye exhibits no discharge. Left eye exhibits no discharge.  Neck: Normal range of motion. Neck supple. No thyromegaly present.  Cardiovascular: Normal rate, regular rhythm, normal heart sounds and intact distal pulses.   No murmur heard. Pulmonary/Chest: Effort normal and breath sounds normal. No respiratory distress. He has no wheezes.  Abdominal: Soft. Bowel sounds are normal. He exhibits no distension. There is no tenderness.  Musculoskeletal: Normal range of motion. He exhibits edema (Trace amt in left LE- with discoloration of leg (hx of blood clot)). He exhibits no tenderness.  Neurological: He is alert and oriented to person, place, and time. He has normal reflexes. No cranial nerve deficit.  Skin: Skin is warm and dry. No rash noted. No erythema.  Psychiatric: He has a normal mood and affect. His behavior is normal. Judgment and thought content normal.  Vitals reviewed.     BP 96/61 mmHg  Pulse 76  Temp(Src) 96.5 F (35.8 C) (Oral)  Ht $R'5\' 2"'JW$  (1.575 m)  Wt 182 lb 6.4 oz (82.736 kg)  BMI 33.35 kg/m2     Assessment & Plan:  1. Gastroesophageal reflux disease, esophagitis presence not specified - CMP14+EGFR  2. Hyperlipidemia - CMP14+EGFR - Lipid panel  3. Encounter for vitamin deficiency screening - Vit D  25 hydroxy (rtn osteoporosis monitoring)   Continue all  meds Labs pending Health Maintenance reviewed-TDAP given today Diet and exercise encouraged RTO 6 months  Evelina Dun, FNP

## 2014-04-01 NOTE — Patient Instructions (Signed)

## 2014-04-02 ENCOUNTER — Other Ambulatory Visit: Payer: Self-pay | Admitting: Family

## 2014-04-02 DIAGNOSIS — R7989 Other specified abnormal findings of blood chemistry: Secondary | ICD-10-CM

## 2014-04-02 LAB — CMP14+EGFR
A/G RATIO: 1.2 (ref 1.1–2.5)
ALT: 23 IU/L (ref 0–44)
AST: 23 IU/L (ref 0–40)
Albumin: 3.5 g/dL (ref 3.5–5.5)
Alkaline Phosphatase: 67 IU/L (ref 39–117)
BUN/Creatinine Ratio: 10 (ref 9–20)
BUN: 17 mg/dL (ref 6–24)
CO2: 25 mmol/L (ref 18–29)
CREATININE: 1.77 mg/dL — AB (ref 0.76–1.27)
Calcium: 8.7 mg/dL (ref 8.7–10.2)
Chloride: 100 mmol/L (ref 97–108)
GFR calc Af Amer: 49 mL/min/{1.73_m2} — ABNORMAL LOW (ref >59)
GFR calc non Af Amer: 43 mL/min/{1.73_m2} — ABNORMAL LOW (ref >59)
GLOBULIN, TOTAL: 3 g/dL (ref 1.5–4.5)
Glucose: 90 mg/dL (ref 65–99)
Potassium: 5 mmol/L (ref 3.5–5.2)
Sodium: 139 mmol/L (ref 134–144)
Total Bilirubin: 0.3 mg/dL (ref 0.0–1.2)
Total Protein: 6.5 g/dL (ref 6.0–8.5)

## 2014-04-02 LAB — LIPID PANEL
CHOL/HDL RATIO: 3.5 ratio (ref 0.0–5.0)
CHOLESTEROL TOTAL: 144 mg/dL (ref 100–199)
HDL: 41 mg/dL (ref >39)
LDL CALC: 78 mg/dL (ref 0–99)
Triglycerides: 123 mg/dL (ref 0–149)
VLDL CHOLESTEROL CAL: 25 mg/dL (ref 5–40)

## 2014-04-02 LAB — VITAMIN D 25 HYDROXY (VIT D DEFICIENCY, FRACTURES): VIT D 25 HYDROXY: 44.2 ng/mL (ref 30.0–100.0)

## 2014-04-08 DIAGNOSIS — B351 Tinea unguium: Secondary | ICD-10-CM | POA: Diagnosis not present

## 2014-04-08 DIAGNOSIS — M79676 Pain in unspecified toe(s): Secondary | ICD-10-CM | POA: Diagnosis not present

## 2014-05-06 ENCOUNTER — Other Ambulatory Visit (INDEPENDENT_AMBULATORY_CARE_PROVIDER_SITE_OTHER): Payer: Medicare Other

## 2014-05-06 DIAGNOSIS — R7989 Other specified abnormal findings of blood chemistry: Secondary | ICD-10-CM | POA: Diagnosis not present

## 2014-05-06 DIAGNOSIS — R748 Abnormal levels of other serum enzymes: Secondary | ICD-10-CM

## 2014-05-06 NOTE — Progress Notes (Signed)
Lab only 

## 2014-05-07 ENCOUNTER — Other Ambulatory Visit: Payer: Self-pay | Admitting: Family

## 2014-05-07 DIAGNOSIS — R7989 Other specified abnormal findings of blood chemistry: Secondary | ICD-10-CM

## 2014-05-07 LAB — BMP8+EGFR
BUN/Creatinine Ratio: 7 — ABNORMAL LOW (ref 9–20)
BUN: 12 mg/dL (ref 6–24)
CALCIUM: 9.1 mg/dL (ref 8.7–10.2)
CHLORIDE: 103 mmol/L (ref 97–108)
CO2: 25 mmol/L (ref 18–29)
Creatinine, Ser: 1.8 mg/dL — ABNORMAL HIGH (ref 0.76–1.27)
GFR calc Af Amer: 48 mL/min/{1.73_m2} — ABNORMAL LOW (ref >59)
GFR calc non Af Amer: 42 mL/min/{1.73_m2} — ABNORMAL LOW (ref >59)
GLUCOSE: 90 mg/dL (ref 65–99)
POTASSIUM: 5.3 mmol/L — AB (ref 3.5–5.2)
Sodium: 143 mmol/L (ref 134–144)

## 2014-05-20 DIAGNOSIS — Z85828 Personal history of other malignant neoplasm of skin: Secondary | ICD-10-CM | POA: Diagnosis not present

## 2014-05-20 DIAGNOSIS — L570 Actinic keratosis: Secondary | ICD-10-CM | POA: Diagnosis not present

## 2014-06-20 DIAGNOSIS — N3020 Other chronic cystitis without hematuria: Secondary | ICD-10-CM | POA: Diagnosis not present

## 2014-06-20 DIAGNOSIS — R339 Retention of urine, unspecified: Secondary | ICD-10-CM | POA: Diagnosis not present

## 2014-06-20 DIAGNOSIS — N1330 Unspecified hydronephrosis: Secondary | ICD-10-CM | POA: Diagnosis not present

## 2014-06-20 DIAGNOSIS — N359 Urethral stricture, unspecified: Secondary | ICD-10-CM | POA: Diagnosis not present

## 2014-06-26 ENCOUNTER — Other Ambulatory Visit: Payer: Self-pay | Admitting: Urology

## 2014-07-01 ENCOUNTER — Encounter (HOSPITAL_BASED_OUTPATIENT_CLINIC_OR_DEPARTMENT_OTHER): Payer: Self-pay | Admitting: *Deleted

## 2014-07-01 NOTE — Progress Notes (Signed)
Spoke with mother-Instucted Npo after Mn-Istat on arrival.

## 2014-07-01 NOTE — H&P (Signed)
History of Present Illness   Frank Levine returns today for routine followup. Last several notes will outline his very complicated history. He has bilateral hydronephrosis, typically right greater than left. He has clear evidence of left-sided vesicoureteral reflux and may have some component of right-sided reflux. He has a markedly tortuous ureter on the right side and may have a non-refluxing, non-obstructing mega ureter on that side. He has had recurrent urethral stricture disease. He has undergone a number of dilation procedures. He does not empty his bladder completely and has had some recurrent infections.  His recent blood work had suggested some ongoing renal insufficiency, but not as bad as he has been historically. At one point, he did have an elevated creatinine of 1.9. It did get down to as low as 1.1 and more recently has been around 1.4. He has not required any emergency room visits for urinary tract infection or any other problems. Postvoid residuals typically are in the 200-300 cc range. His last procedure, again, was performed back in May of 2015.     On today's visit he really has no new complaints. His mom does not feel there is any obvious noticeable difference. His creatinine has been trending and several months ago his creatinine was 1.8. This was rechecked in February of this year and was stable at 1.8. His potassium also been slightly elevated at 5.3. He denies any dysuria. Urinalysis is abnormal with significant pyuria. His last urine culture was negative here in December.    Renal ultrasonography: There continues to be evidence of severe hydronephrosis of the right collecting system and renal pelvis. The kidney itself measures 12.5 cm. No stones or tumors noted. On the left side, the dilation is moderate to severe. The kidney measures 11.5 cm. There is no evidence of stones bilaterally. Proximal ureters are dilated bilaterally. Again, no other significant problems with the kidney.  Postvoid residual today was 194 mL.    Past Medical History Problems  1. History of hypercholesterolemia (Z86.39) 2. History of Venous Thrombosis Of The Deep Vessels Of The Lower Extremity  Surgical History Problems  1. History of Cystoscopy For Urethral Stricture 2. History of Cystoscopy With Biopsy 3. History of Cystoscopy With Insertion Of Ureteral Stent Bilateral 4. History of Cystoscopy With Insertion Of Ureteral Stent Left 5. History of Cystoscopy With Insertion Of Ureteral Stent Right 6. History of Cystoscopy With Ureteroscopy Left 7. History of Cystoscopy With Ureteroscopy Left 8. History of Cystoscopy With Ureteroscopy Left 9. History of Cystoscopy With Ureteroscopy Left 10. History of Eye Surgery  Current Meds 1. Aspirin 81 MG Oral Tablet; 1 per day;  Therapy: (Recorded:17Jul2012) to Recorded 2. Pantoprazole Sodium 40 MG Oral Tablet Delayed Release;  Therapy: [id number] to Recorded 3. Simvastatin 40 MG Oral Tablet; 1at night;  Therapy: (Recorded:20Mar2012) to Recorded 4. Stool Softener CAPS; 1 per day;  Therapy: (Recorded:20Mar2012) to Recorded 5. Sulfamethoxazole-Trimethoprim 400-80 MG Oral Tablet; TAKE ONE TABLET BY MOUTH  AT BEDTIME;  Therapy: [id number] to (Last Rx:19Jan2016)  Requested for: [id number] Ordered 6. Tamsulosin HCl - 0.4 MG Oral Capsule; TAKE ONE CAPSULE BY MOUTH ONE TIME  DAILY;  Therapy: [id number] to (Last Rx:10Feb2015)  Requested for: [id number] Ordered  Allergies Medication  1. No Known Drug Allergies  Family History Problems  1. Family history of Esophageal Cancer : Mother 2. Family history of Family Health Status - Father's Age : Mother   31 3. Family history of Family Health Status - Mother's Age : Mother   4 4. Family history  of Hypertension  Social History Problems  1. Denied: History of Alcohol Use 2. Caffeine Use 3. Marital History - Single 4. Never A Smoker 5. Denied: History of Tobacco Use  Vitals Vital Signs [Data  Includes: Last 1 Day]  Recorded: 15Apr2016 11:32AM  Blood Pressure: 112 / 70 Temperature: 97.9 F Heart Rate: 91  Physical Exam Constitutional: Well nourished and well developed . No acute distress. The patient appears well hydrated.  Abdomen: The abdomen is mildly obese. The abdomen is soft and nontender. No suprapubic tenderness.  Skin: Normal skin turgor and normal skin color and pigmentation.  Neuro/Psych:. Mood and affect are appropriate.  Results/Data  20 Jun 2014 10:46 AM  UA With REFLEX    COLOR YELLOW     APPEARANCE CLOUDY     SPECIFIC GRAVITY 1.015     pH 6.5     GLUCOSE NEG     BILIRUBIN NEG     KETONE NEG     BLOOD MOD     PROTEIN NEG     UROBILINOGEN 0.2     NITRITE NEG     LEUKOCYTE ESTERASE LARGE     SQUAMOUS EPITHELIAL/HPF RARE     WBC TNTC     CRYSTALS NONE SEEN     CASTS NONE SEEN     RBC 3-6     BACTERIA FEW     Assessment Assessed  1. Hydronephrosis (N13.30) 2. Urethral stricture (N35.9) 3. Incomplete bladder emptying (R33.9) 4. Chronic cystitis (N30.20)  Plan Hydronephrosis  1. BASIC METABOLIC PANEL; Status:In Progress - Specimen/Data Collected;   Done:  [id number] 2. RENAL U/S COMPLETE; Status:Hold For - Date of Service; Requested [id number];  3. VENIPUNCTURE; Status:Complete;   Done: [id number] 4. Follow-up Month x 6 Office  Follow-up  Status: Hold For - Date of Service  Requested for:  25Oct2016  Discussion/Summary Bilateral hydronephrosis. On the left this is primarily due to reflux. On the right we have not been able to document reflux but he may have a nonobstructive nonrefluxing megaureter. He also has urethral stricture disease with incomplete bladder emptying. If creatinine is improved my recommendation would be ongoing observation. His creatinine has continued to increase and I would recommend a repeat treatment of his urethral stricture potentially with catheter drainage to see if the creatinine doesn't improve. We will plan on a  cystogram potential retrograde pyelograms as indicated. If things are stable we will continue a 6 month follow-up.   Signatures Electronically signed by : Rana Snare, M.D.; Jun 20 2014  5:04PM EST

## 2014-07-02 ENCOUNTER — Encounter (HOSPITAL_BASED_OUTPATIENT_CLINIC_OR_DEPARTMENT_OTHER)
Admission: RE | Disposition: A | Discharge status: Home / Self Care | Payer: Self-pay | Source: Ambulatory Visit | Attending: Urology

## 2014-07-02 ENCOUNTER — Ambulatory Visit (HOSPITAL_BASED_OUTPATIENT_CLINIC_OR_DEPARTMENT_OTHER): Payer: Medicare Other | Admitting: Anesthesiology

## 2014-07-02 ENCOUNTER — Encounter (HOSPITAL_BASED_OUTPATIENT_CLINIC_OR_DEPARTMENT_OTHER): Payer: Self-pay | Admitting: *Deleted

## 2014-07-02 ENCOUNTER — Ambulatory Visit (HOSPITAL_BASED_OUTPATIENT_CLINIC_OR_DEPARTMENT_OTHER)
Admission: RE | Admit: 2014-07-02 | Discharge: 2014-07-02 | Disposition: A | Discharge status: Home / Self Care | Payer: Medicare Other | Source: Ambulatory Visit | Attending: Urology | Admitting: Urology

## 2014-07-02 DIAGNOSIS — N400 Enlarged prostate without lower urinary tract symptoms: Secondary | ICD-10-CM | POA: Insufficient documentation

## 2014-07-02 DIAGNOSIS — Z7982 Long term (current) use of aspirin: Secondary | ICD-10-CM | POA: Diagnosis not present

## 2014-07-02 DIAGNOSIS — Q909 Down syndrome, unspecified: Secondary | ICD-10-CM | POA: Diagnosis not present

## 2014-07-02 DIAGNOSIS — M1990 Unspecified osteoarthritis, unspecified site: Secondary | ICD-10-CM | POA: Insufficient documentation

## 2014-07-02 DIAGNOSIS — N3289 Other specified disorders of bladder: Secondary | ICD-10-CM | POA: Insufficient documentation

## 2014-07-02 DIAGNOSIS — N323 Diverticulum of bladder: Secondary | ICD-10-CM | POA: Insufficient documentation

## 2014-07-02 DIAGNOSIS — E785 Hyperlipidemia, unspecified: Secondary | ICD-10-CM | POA: Insufficient documentation

## 2014-07-02 DIAGNOSIS — I739 Peripheral vascular disease, unspecified: Secondary | ICD-10-CM | POA: Insufficient documentation

## 2014-07-02 DIAGNOSIS — N1330 Unspecified hydronephrosis: Secondary | ICD-10-CM | POA: Insufficient documentation

## 2014-07-02 DIAGNOSIS — F419 Anxiety disorder, unspecified: Secondary | ICD-10-CM | POA: Diagnosis not present

## 2014-07-02 DIAGNOSIS — K219 Gastro-esophageal reflux disease without esophagitis: Secondary | ICD-10-CM | POA: Diagnosis not present

## 2014-07-02 DIAGNOSIS — N358 Other urethral stricture: Secondary | ICD-10-CM | POA: Insufficient documentation

## 2014-07-02 DIAGNOSIS — Z86718 Personal history of other venous thrombosis and embolism: Secondary | ICD-10-CM | POA: Insufficient documentation

## 2014-07-02 DIAGNOSIS — N359 Urethral stricture, unspecified: Secondary | ICD-10-CM | POA: Diagnosis not present

## 2014-07-02 HISTORY — PX: CYSTOGRAM: SHX6285

## 2014-07-02 HISTORY — PX: CYSTOSCOPY WITH URETHRAL DILATATION: SHX5125

## 2014-07-02 LAB — POCT I-STAT, CHEM 8
BUN: 19 mg/dL (ref 6–23)
CREATININE: 1.7 mg/dL — AB (ref 0.50–1.35)
Calcium, Ion: 1.22 mmol/L (ref 1.12–1.23)
Chloride: 104 mmol/L (ref 96–112)
Glucose, Bld: 96 mg/dL (ref 70–99)
HCT: 44 % (ref 39.0–52.0)
Hemoglobin: 15 g/dL (ref 13.0–17.0)
Potassium: 4.4 mmol/L (ref 3.5–5.1)
Sodium: 144 mmol/L (ref 135–145)
TCO2: 27 mmol/L (ref 0–100)

## 2014-07-02 SURGERY — CYSTOSCOPY, WITH URETHRAL DILATION
Anesthesia: General | Site: Urethra

## 2014-07-02 MED ORDER — OXYCODONE HCL 5 MG/5ML PO SOLN
5.0000 mg | Freq: Once | ORAL | Status: DC | PRN
  Filled 2014-07-02: qty 5

## 2014-07-02 MED ORDER — FENTANYL CITRATE (PF) 100 MCG/2ML IJ SOLN
25.0000 ug | INTRAMUSCULAR | Status: DC | PRN
  Filled 2014-07-02: qty 1

## 2014-07-02 MED ORDER — FENTANYL CITRATE (PF) 100 MCG/2ML IJ SOLN
INTRAMUSCULAR | Status: AC
  Filled 2014-07-02: qty 4

## 2014-07-02 MED ORDER — DEXAMETHASONE SODIUM PHOSPHATE 4 MG/ML IJ SOLN
INTRAMUSCULAR | Status: DC | PRN
  Administered 2014-07-02: 10 mg via INTRAVENOUS

## 2014-07-02 MED ORDER — MIDAZOLAM HCL 5 MG/5ML IJ SOLN
INTRAMUSCULAR | Status: DC | PRN
  Administered 2014-07-02: 1 mg via INTRAVENOUS

## 2014-07-02 MED ORDER — DIATRIZOATE MEGLUMINE 30 % UR SOLN
URETHRAL | Status: DC | PRN
  Administered 2014-07-02: 150 mL via URETHRAL

## 2014-07-02 MED ORDER — GENTAMICIN SULFATE 40 MG/ML IJ SOLN
320.0000 mg | INTRAVENOUS | Status: AC
  Administered 2014-07-02: 320 mg via INTRAVENOUS
  Filled 2014-07-02: qty 8

## 2014-07-02 MED ORDER — PHENYLEPHRINE HCL 10 MG/ML IJ SOLN
INTRAMUSCULAR | Status: DC | PRN
  Administered 2014-07-02 (×2): 40 ug via INTRAVENOUS

## 2014-07-02 MED ORDER — IOHEXOL 350 MG/ML SOLN
INTRAVENOUS | Status: DC | PRN
  Administered 2014-07-02: 10 mL

## 2014-07-02 MED ORDER — ACETAMINOPHEN 10 MG/ML IV SOLN
INTRAVENOUS | Status: DC | PRN
  Administered 2014-07-02: 1000 mg via INTRAVENOUS

## 2014-07-02 MED ORDER — LIDOCAINE HCL (CARDIAC) 20 MG/ML IV SOLN
INTRAVENOUS | Status: DC | PRN
  Administered 2014-07-02: 60 mg via INTRAVENOUS

## 2014-07-02 MED ORDER — OXYCODONE HCL 5 MG PO TABS
5.0000 mg | ORAL_TABLET | Freq: Once | ORAL | Status: DC | PRN
  Filled 2014-07-02: qty 1

## 2014-07-02 MED ORDER — LACTATED RINGERS IV SOLN
INTRAVENOUS | Status: DC
  Administered 2014-07-02: 08:00:00 via INTRAVENOUS
  Filled 2014-07-02: qty 1000

## 2014-07-02 MED ORDER — STERILE WATER FOR IRRIGATION IR SOLN
Status: DC | PRN
  Administered 2014-07-02: 3000 mL

## 2014-07-02 MED ORDER — GENTAMICIN SULFATE 40 MG/ML IJ SOLN
5.0000 mg/kg | INTRAVENOUS | Status: DC
  Filled 2014-07-02: qty 10.25

## 2014-07-02 MED ORDER — ONDANSETRON HCL 4 MG/2ML IJ SOLN
INTRAMUSCULAR | Status: DC | PRN
Start: 2014-07-02 — End: 2014-07-02
  Administered 2014-07-02: 4 mg via INTRAVENOUS

## 2014-07-02 MED ORDER — ONDANSETRON HCL 4 MG/2ML IJ SOLN
4.0000 mg | Freq: Four times a day (QID) | INTRAMUSCULAR | Status: DC | PRN
  Filled 2014-07-02: qty 2

## 2014-07-02 MED ORDER — FENTANYL CITRATE (PF) 100 MCG/2ML IJ SOLN
INTRAMUSCULAR | Status: DC | PRN
  Administered 2014-07-02: 50 ug via INTRAVENOUS

## 2014-07-02 MED ORDER — PROPOFOL 10 MG/ML IV BOLUS
INTRAVENOUS | Status: DC | PRN
  Administered 2014-07-02: 150 mg via INTRAVENOUS

## 2014-07-02 MED ORDER — MIDAZOLAM HCL 2 MG/2ML IJ SOLN
INTRAMUSCULAR | Status: AC
  Filled 2014-07-02: qty 2

## 2014-07-02 SURGICAL SUPPLY — 34 items
BAG DRAIN URO-CYSTO SKYTR STRL (DRAIN) ×3 IMPLANT
BAG DRN UROCATH (DRAIN) ×2
BAG URINE DRAINAGE (UROLOGICAL SUPPLIES) IMPLANT
BAG URINE LEG 500ML (DRAIN) ×1 IMPLANT
BALLN NEPHROSTOMY (BALLOONS) ×3
BALLOON NEPHROSTOMY (BALLOONS) ×2 IMPLANT
CANISTER SUCT LVC 12 LTR MEDI- (MISCELLANEOUS) ×1 IMPLANT
CATH FOLEY 2W COUNCIL 5CC 18FR (CATHETERS) ×1 IMPLANT
CATH FOLEY 2WAY SLVR  5CC 16FR (CATHETERS)
CATH FOLEY 2WAY SLVR  5CC 18FR (CATHETERS)
CATH FOLEY 2WAY SLVR  5CC 20FR (CATHETERS)
CATH FOLEY 2WAY SLVR 5CC 16FR (CATHETERS) IMPLANT
CATH FOLEY 2WAY SLVR 5CC 18FR (CATHETERS) IMPLANT
CATH FOLEY 2WAY SLVR 5CC 20FR (CATHETERS) IMPLANT
CLOTH BEACON ORANGE TIMEOUT ST (SAFETY) ×3 IMPLANT
ELECT REM PT RETURN 9FT ADLT (ELECTROSURGICAL) ×3
ELECTRODE REM PT RTRN 9FT ADLT (ELECTROSURGICAL) ×2 IMPLANT
GLOVE BIO SURGEON STRL SZ 6.5 (GLOVE) ×1 IMPLANT
GLOVE BIO SURGEON STRL SZ7.5 (GLOVE) ×3 IMPLANT
GLOVE INDICATOR 6.5 STRL GRN (GLOVE) ×2 IMPLANT
GOWN STRL REUS W/ TWL LRG LVL3 (GOWN DISPOSABLE) IMPLANT
GOWN STRL REUS W/ TWL XL LVL3 (GOWN DISPOSABLE) ×2 IMPLANT
GOWN STRL REUS W/TWL LRG LVL3 (GOWN DISPOSABLE) ×3
GOWN STRL REUS W/TWL XL LVL3 (GOWN DISPOSABLE) ×3
GUIDEWIRE STR DUAL SENSOR (WIRE) IMPLANT
HOLDER FOLEY CATH W/STRAP (MISCELLANEOUS) IMPLANT
NDL SAFETY ECLIPSE 18X1.5 (NEEDLE) IMPLANT
NEEDLE HYPO 18GX1.5 SHARP (NEEDLE)
NEEDLE HYPO 22GX1.5 SAFETY (NEEDLE) IMPLANT
NS IRRIG 500ML POUR BTL (IV SOLUTION) ×1 IMPLANT
PACK CYSTO (CUSTOM PROCEDURE TRAY) ×3 IMPLANT
SYR 20CC LL (SYRINGE) IMPLANT
SYR 30ML LL (SYRINGE) IMPLANT
WATER STERILE IRR 3000ML UROMA (IV SOLUTION) ×3 IMPLANT

## 2014-07-02 NOTE — Addendum Note (Signed)
Addendum  created 07/02/14 1109 by Mechele Claude, CRNA   Modules edited: Anesthesia Attestations

## 2014-07-02 NOTE — Anesthesia Postprocedure Evaluation (Signed)
Anesthesia Post Note  Patient: Frank Levine  Procedure(s) Performed: Procedure(s) (LRB): CYSTOSCOPY WITH URETHRAL DILATATION (N/A) CYSTOGRAM (N/A)  Anesthesia type: General  Patient location: PACU  Post pain: Pain level controlled and Adequate analgesia  Post assessment: Post-op Vital signs reviewed, Patient's Cardiovascular Status Stable, Respiratory Function Stable, Patent Airway and Pain level controlled  Last Vitals:  Filed Vitals:   07/02/14 1100  BP: 95/77  Pulse: 76  Temp:   Resp: 10    Post vital signs: Reviewed and stable  Level of consciousness: awake, alert  and oriented  Complications: No apparent anesthesia complications

## 2014-07-02 NOTE — Op Note (Signed)
Preoperative diagnosis: Recurrent bulbar urethral stricture disease, bilateral hydronephrosis Postoperative diagnosis: Same  Procedure: Cystoscopy, balloon dilation of urethral stricture, cystogram, right retrograde pyelogram  Surgeon: Bernestine Amass M.D.  Anesthesia: Gen.  Indications: Frank Levine is a 54 year old male with Down's syndrome. He has had a somewhat complicated history over the last several years. He was found initially to have significant renal insufficiency with severe bilateral hydronephrosis and severe ureteral tortuosity. He was found to have significant postvoid residual and bulbar urethral stricture disease which was dilated. There was a possible concern over some extrinsic compression of his intermural ureter due to bladder wall thickening. He has had a number of procedures over the years. His creatinine did eventually improved to 1.1 from a high of approximately 2.0. Recently his creatinine has been increasing again. There was evidence of increasing postvoid residual he presents today for reassessment.    Technique and findings: Patient was brought the operating room where he had successful induction general anesthesia. He was placed in lithotomy position prepped and draped in usual manner. He did receive perioperative antibiotics. Appropriate surgical timeout was performed. Cystoscopy again revealed a fairly tight bulbar urethral stricture estimated at around 6 [place]. Guidewire was placed through this area and into the bladder with fluoroscopy. A 24 French fascial dilating balloon was then used to dilate the stricture. Cystoscopy again revealed 4+ bladder trabeculation with a large mouth diverticula in the right posterior lateral aspect of the bladder. Cystogram was performed.Bladder capacity was 1000 mL. Evidence of clear high-grade left sided reflux. No reflux was noted on the right side. Retrograde pyelogram was done of the right ureter with fluoroscopic interpretation. Again a  markedly dilated and tortuous ureter was noted. There did not appear to be obvious extrinsic compression of the intermural ureter but it's difficult to rule out conclusively. I believe his markedly dilated and tortuous ureters are the result of at least reflux on the left side as well as long-standing bladder residual/high pressure bladder. A 18 French council tip Foley catheter was inserted which will be attached to a leg bag. The patient no obvious complications and was brought to recovery in stable condition.

## 2014-07-02 NOTE — Transfer of Care (Signed)
  Last Vitals:  Filed Vitals:   07/02/14 0756  BP: 103/74  Pulse: 84  Temp: 36.7 C  Resp: 12  Immediate Anesthesia Transfer of Care Note  Patient: Frank Levine  Procedure(s) Performed: Procedure(s) (LRB): CYSTOSCOPY WITH URETHRAL DILATATION (N/A) CYSTOGRAM (N/A)  Patient Location: PACU  Anesthesia Type: General  Level of Consciousness: awake, alert  and oriented  Airway & Oxygen Therapy: Patient Spontanous Breathing and Patient connected to nasal cannula oxygen  Post-op Assessment: Report given to PACU RN and Post -op Vital signs reviewed and stable  Post vital signs: Reviewed and stable  Complications: No apparent anesthesia complications

## 2014-07-02 NOTE — Anesthesia Preprocedure Evaluation (Signed)
Anesthesia Evaluation  Patient identified by MRN, date of birth, ID band Patient awake    Reviewed: Allergy & Precautions, NPO status , Patient's Chart, lab work & pertinent test results  Airway Mallampati: II   Neck ROM: full    Dental   Pulmonary neg pulmonary ROS,  breath sounds clear to auscultation        Cardiovascular + Peripheral Vascular Disease Rhythm:regular Rate:Normal     Neuro/Psych Anxiety Down's Syndrome    GI/Hepatic GERD-  ,  Endo/Other  obese  Renal/GU Renal InsufficiencyRenal disease     Musculoskeletal   Abdominal   Peds  Hematology   Anesthesia Other Findings   Reproductive/Obstetrics                             Anesthesia Physical Anesthesia Plan  ASA: II  Anesthesia Plan: General   Post-op Pain Management:    Induction: Intravenous  Airway Management Planned: LMA  Additional Equipment:   Intra-op Plan:   Post-operative Plan:   Informed Consent: I have reviewed the patients History and Physical, chart, labs and discussed the procedure including the risks, benefits and alternatives for the proposed anesthesia with the patient or authorized representative who has indicated his/her understanding and acceptance.     Plan Discussed with: CRNA, Anesthesiologist and Surgeon  Anesthesia Plan Comments:         Anesthesia Quick Evaluation

## 2014-07-02 NOTE — Anesthesia Procedure Notes (Signed)
Procedure Name: LMA Insertion Date/Time: 07/02/2014 9:30 AM Performed by: Mechele Claude Pre-anesthesia Checklist: Patient identified, Emergency Drugs available, Suction available and Patient being monitored Patient Re-evaluated:Patient Re-evaluated prior to inductionOxygen Delivery Method: Circle System Utilized Preoxygenation: Pre-oxygenation with 100% oxygen Intubation Type: IV induction Ventilation: Mask ventilation without difficulty LMA: LMA inserted LMA Size: 5.0 Number of attempts: 1 Airway Equipment and Method: bite block Placement Confirmation: positive ETCO2 Tube secured with: Tape Dental Injury: Teeth and Oropharynx as per pre-operative assessment

## 2014-07-02 NOTE — Interval H&P Note (Signed)
History and Physical Interval Note:  07/02/2014 9:06 AM  Frank Levine  has presented today for surgery, with the diagnosis of URETHRAL STRICTURE. HYDRONEPHROSIS  The various methods of treatment have been discussed with the patient and family. After consideration of risks, benefits and other options for treatment, the patient has consented to  Procedure(s): CYSTOSCOPY WITH URETHRAL DILATATION (N/A) CYSTOGRAM (N/A) as a surgical intervention .  The patient's history has been reviewed, patient examined, no change in status, stable for surgery.  I have reviewed the patient's chart and labs.  Questions were answered to the patient's satisfaction.     [name] S

## 2014-07-02 NOTE — Discharge Instructions (Addendum)

## 2014-07-03 ENCOUNTER — Encounter (HOSPITAL_BASED_OUTPATIENT_CLINIC_OR_DEPARTMENT_OTHER): Payer: Self-pay | Admitting: Urology

## 2014-07-08 DIAGNOSIS — M79676 Pain in unspecified toe(s): Secondary | ICD-10-CM | POA: Diagnosis not present

## 2014-07-08 DIAGNOSIS — B351 Tinea unguium: Secondary | ICD-10-CM | POA: Diagnosis not present

## 2014-08-25 ENCOUNTER — Other Ambulatory Visit: Payer: Self-pay

## 2014-08-25 DIAGNOSIS — K2100 Gastro-esophageal reflux disease with esophagitis, without bleeding: Secondary | ICD-10-CM

## 2014-08-25 MED ORDER — PANTOPRAZOLE SODIUM 40 MG PO TBEC: 40.0000 mg | DELAYED_RELEASE_TABLET | Freq: Every day | ORAL | Status: DC

## 2014-09-17 DIAGNOSIS — G44219 Episodic tension-type headache, not intractable: Secondary | ICD-10-CM | POA: Diagnosis not present

## 2014-09-23 ENCOUNTER — Other Ambulatory Visit: Payer: Self-pay | Admitting: Family

## 2014-10-02 ENCOUNTER — Ambulatory Visit (INDEPENDENT_AMBULATORY_CARE_PROVIDER_SITE_OTHER): Payer: Medicare Other | Admitting: Family

## 2014-10-02 ENCOUNTER — Encounter: Payer: Self-pay | Admitting: Family

## 2014-10-02 VITALS — BP 112/77 | HR 71 | Temp 97.8°F | Ht 62.0 in | Wt 188.6 lb

## 2014-10-02 DIAGNOSIS — Q909 Down syndrome, unspecified: Secondary | ICD-10-CM | POA: Diagnosis not present

## 2014-10-02 DIAGNOSIS — K219 Gastro-esophageal reflux disease without esophagitis: Secondary | ICD-10-CM

## 2014-10-02 DIAGNOSIS — E785 Hyperlipidemia, unspecified: Secondary | ICD-10-CM | POA: Diagnosis not present

## 2014-10-02 MED ORDER — PANTOPRAZOLE SODIUM 40 MG PO TBEC: 40.0000 mg | DELAYED_RELEASE_TABLET | Freq: Every day | ORAL | Status: DC

## 2014-10-02 NOTE — Addendum Note (Signed)
Addended by: Earlene Plater on: 10/02/2014 09:10 AM   Modules accepted: Miquel Dunn

## 2014-10-02 NOTE — Progress Notes (Signed)
   Subjective:    Patient ID: Frank Levine, male    DOB: 11/16/1960, 54 y.o.   MRN: [id number]  Pt presents to the office today for chronic follow up. Pt is followed by Urologists every 6 months.  Hyperlipidemia This is a chronic problem. The current episode started more than 1 year ago. The problem is controlled. Recent lipid tests were reviewed and are normal. Exacerbating diseases include obesity. He has no history of diabetes or hypothyroidism. Factors aggravating his hyperlipidemia include fatty foods. Pertinent negatives include no leg pain, myalgias or shortness of breath. Current antihyperlipidemic treatment includes statins. The current treatment provides moderate improvement of lipids. Risk factors for coronary artery disease include dyslipidemia, hypertension, male sex and obesity.  Gastrophageal Reflux He reports no belching, no coughing, no dysphagia, no heartburn, no sore throat or no wheezing. This is a chronic problem. The current episode started more than 1 year ago. The problem occurs rarely. The problem has been resolved. The symptoms are aggravated by lying down and certain foods. He has tried a PPI for the symptoms. The treatment provided significant relief.      Review of Systems  Constitutional: Negative.   HENT: Negative.  Negative for sore throat.   Respiratory: Negative.  Negative for cough, shortness of breath and wheezing.   Cardiovascular: Negative.   Gastrointestinal: Negative.  Negative for heartburn and dysphagia.  Endocrine: Negative.   Genitourinary: Negative.   Musculoskeletal: Negative.  Negative for myalgias.  Neurological: Negative.   Hematological: Negative.   Psychiatric/Behavioral: Negative.   All other systems reviewed and are negative.      Objective:   Physical Exam  Constitutional: He is oriented to person, place, and time. He appears well-developed and well-nourished. No distress.  HENT:  Head: Normocephalic.  Right Ear: External ear  normal.  Left Ear: External ear normal.  Nose: Nose normal.  Mouth/Throat: Oropharynx is clear and moist.  Eyes: Pupils are equal, round, and reactive to light. Right eye exhibits no discharge. Left eye exhibits no discharge.  Neck: Normal range of motion. Neck supple. No thyromegaly present.  Cardiovascular: Normal rate, regular rhythm, normal heart sounds and intact distal pulses.   No murmur heard. Pulmonary/Chest: Effort normal and breath sounds normal. No respiratory distress. He has no wheezes.  Abdominal: Soft. Bowel sounds are normal. He exhibits no distension. There is no tenderness.  Musculoskeletal: Normal range of motion. He exhibits no edema or tenderness.  Neurological: He is alert and oriented to person, place, and time. He has normal reflexes. No cranial nerve deficit.  Skin: Skin is warm and dry. No rash noted. No erythema.  Psychiatric: He has a normal mood and affect. His behavior is normal. Judgment and thought content normal.  Vitals reviewed.     BP 112/77 mmHg  Pulse 71  Temp(Src) 97.8 F (36.6 C) (Oral)  Ht $R'5\' 2"'Aa$  (1.575 m)  Wt 188 lb 9.6 oz (85.548 kg)  BMI 34.49 kg/m2     Assessment & Plan:  1. Gastroesophageal reflux disease, esophagitis presence not specified - CMP14+EGFR - pantoprazole (PROTONIX) 40 MG tablet; Take 1 tablet (40 mg total) by mouth daily.  Dispense: 90 tablet; Refill: 3  2. Down's syndrome - CMP14+EGFR  3. Hyperlipidemia - CMP14+EGFR - Lipid panel   Continue all meds Labs pending Health Maintenance reviewed Diet and exercise encouraged RTO 6 months   Evelina Dun, FNP

## 2014-10-02 NOTE — Patient Instructions (Signed)

## 2014-10-03 LAB — CMP14+EGFR
ALBUMIN: 3.9 g/dL (ref 3.5–5.5)
ALK PHOS: 72 IU/L (ref 39–117)
ALT: 20 IU/L (ref 0–44)
AST: 18 IU/L (ref 0–40)
Albumin/Globulin Ratio: 1.5 (ref 1.1–2.5)
BUN/Creatinine Ratio: 8 — ABNORMAL LOW (ref 9–20)
BUN: 13 mg/dL (ref 6–24)
Bilirubin Total: 0.4 mg/dL (ref 0.0–1.2)
CO2: 26 mmol/L (ref 18–29)
CREATININE: 1.68 mg/dL — AB (ref 0.76–1.27)
Calcium: 8.8 mg/dL (ref 8.7–10.2)
Chloride: 102 mmol/L (ref 97–108)
GFR calc Af Amer: 52 mL/min/{1.73_m2} — ABNORMAL LOW (ref >59)
GFR, EST NON AFRICAN AMERICAN: 45 mL/min/{1.73_m2} — AB (ref >59)
GLOBULIN, TOTAL: 2.6 g/dL (ref 1.5–4.5)
Glucose: 95 mg/dL (ref 65–99)
POTASSIUM: 5.1 mmol/L (ref 3.5–5.2)
Sodium: 143 mmol/L (ref 134–144)
TOTAL PROTEIN: 6.5 g/dL (ref 6.0–8.5)

## 2014-10-03 LAB — LIPID PANEL
CHOLESTEROL TOTAL: 172 mg/dL (ref 100–199)
Chol/HDL Ratio: 3 ratio units (ref 0.0–5.0)
HDL: 57 mg/dL (ref >39)
LDL CALC: 99 mg/dL (ref 0–99)
TRIGLYCERIDES: 79 mg/dL (ref 0–149)
VLDL CHOLESTEROL CAL: 16 mg/dL (ref 5–40)

## 2014-10-07 DIAGNOSIS — M79676 Pain in unspecified toe(s): Secondary | ICD-10-CM | POA: Diagnosis not present

## 2014-10-07 DIAGNOSIS — B351 Tinea unguium: Secondary | ICD-10-CM | POA: Diagnosis not present

## 2014-10-10 DIAGNOSIS — N359 Urethral stricture, unspecified: Secondary | ICD-10-CM | POA: Diagnosis not present

## 2014-10-10 DIAGNOSIS — N3020 Other chronic cystitis without hematuria: Secondary | ICD-10-CM | POA: Diagnosis not present

## 2014-10-10 DIAGNOSIS — N1330 Unspecified hydronephrosis: Secondary | ICD-10-CM | POA: Diagnosis not present

## 2014-10-22 ENCOUNTER — Other Ambulatory Visit: Payer: Self-pay

## 2014-10-22 DIAGNOSIS — E785 Hyperlipidemia, unspecified: Secondary | ICD-10-CM

## 2014-10-22 MED ORDER — SIMVASTATIN 40 MG PO TABS: 40.0000 mg | ORAL_TABLET | Freq: Every day | ORAL | Status: DC

## 2014-10-23 ENCOUNTER — Other Ambulatory Visit: Payer: Self-pay | Admitting: *Deleted

## 2014-10-23 DIAGNOSIS — N400 Enlarged prostate without lower urinary tract symptoms: Secondary | ICD-10-CM

## 2014-10-23 MED ORDER — TAMSULOSIN HCL 0.4 MG PO CAPS: 0.4000 mg | ORAL_CAPSULE | Freq: Every day | ORAL | Status: DC

## 2014-11-19 ENCOUNTER — Ambulatory Visit (INDEPENDENT_AMBULATORY_CARE_PROVIDER_SITE_OTHER): Payer: Medicare Other | Admitting: Physician Assistant

## 2014-11-19 ENCOUNTER — Encounter: Payer: Self-pay | Admitting: Physician Assistant

## 2014-11-19 VITALS — BP 105/70 | HR 90 | Temp 98.0°F | Ht 62.0 in | Wt 192.0 lb

## 2014-11-19 DIAGNOSIS — R300 Dysuria: Secondary | ICD-10-CM | POA: Diagnosis not present

## 2014-11-19 NOTE — Progress Notes (Signed)
Patient ID: Frank Levine, male   DOB: [birth year], 54 y.o.   MRN: [id number]  53 y/o male presents for skin check. 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 Fair skin.   PE reveals scaling hyperkeratotic lesions on left ear and face. Lesions were treated with cryosurgery x 9. Benign appearing angiomas noted on upper trunk but required no treatment.   Patient will follow up in 2 weeks for recheck of areas that were treated. If lesions have recurred, I will perform biopsy at that time to r/o SCC/BCC. Otherise, f/u for fsc in 6 months   He also has c/o urinating during the night in bed.He sees a urologist  But would like his urine checked to r/o infection   [name] A. Benjamin Stain PA-C

## 2014-11-19 NOTE — Patient Instructions (Signed)
RTC in 2 weeks for recheck.   [facility] - [phone]

## 2014-11-21 LAB — URINE CULTURE

## 2014-11-23 ENCOUNTER — Other Ambulatory Visit: Payer: Self-pay | Admitting: Physician Assistant

## 2014-11-23 DIAGNOSIS — N3000 Acute cystitis without hematuria: Secondary | ICD-10-CM

## 2014-11-23 MED ORDER — CIPROFLOXACIN HCL 500 MG PO TABS: 500.0000 mg | ORAL_TABLET | Freq: Two times a day (BID) | ORAL | Status: DC

## 2014-12-02 ENCOUNTER — Ambulatory Visit (INDEPENDENT_AMBULATORY_CARE_PROVIDER_SITE_OTHER): Payer: Medicare Other | Admitting: Physician Assistant

## 2014-12-02 ENCOUNTER — Encounter: Payer: Self-pay | Admitting: Physician Assistant

## 2014-12-02 VITALS — BP 111/71 | HR 71 | Ht 62.0 in | Wt 186.0 lb

## 2014-12-02 DIAGNOSIS — Z85828 Personal history of other malignant neoplasm of skin: Secondary | ICD-10-CM

## 2014-12-02 NOTE — Progress Notes (Signed)
54 y/o male with h/o SCC on left ear presents for follow up of cryosurgery treatment on left ear. He is being reassessed today to determine if biopsy is needed to r/o BCC/SCC.   PE reveals healing hyperkeratotic lesion on left auricle. Smooth with mild scale. Minimal erythema. Not concerning at this time for SCC/BCC.   Patient will follow up in 6 months for [facility].   Tiffany A. Benjamin Stain PA-C

## 2014-12-30 DIAGNOSIS — N3020 Other chronic cystitis without hematuria: Secondary | ICD-10-CM | POA: Diagnosis not present

## 2014-12-30 DIAGNOSIS — N1330 Unspecified hydronephrosis: Secondary | ICD-10-CM | POA: Diagnosis not present

## 2015-01-01 DIAGNOSIS — Z23 Encounter for immunization: Secondary | ICD-10-CM | POA: Diagnosis not present

## 2015-01-20 DIAGNOSIS — M79676 Pain in unspecified toe(s): Secondary | ICD-10-CM | POA: Diagnosis not present

## 2015-01-20 DIAGNOSIS — B351 Tinea unguium: Secondary | ICD-10-CM | POA: Diagnosis not present

## 2015-02-06 ENCOUNTER — Telehealth: Payer: Self-pay | Admitting: Family

## 2015-02-06 NOTE — Telephone Encounter (Signed)
Had a flu shot in October at [facility]

## 2015-04-21 DIAGNOSIS — B351 Tinea unguium: Secondary | ICD-10-CM | POA: Diagnosis not present

## 2015-04-21 DIAGNOSIS — M79676 Pain in unspecified toe(s): Secondary | ICD-10-CM | POA: Diagnosis not present

## 2015-04-22 ENCOUNTER — Other Ambulatory Visit: Payer: Self-pay | Admitting: Family Medicine

## 2015-04-22 ENCOUNTER — Other Ambulatory Visit: Payer: Self-pay | Admitting: Family

## 2015-04-30 ENCOUNTER — Ambulatory Visit (INDEPENDENT_AMBULATORY_CARE_PROVIDER_SITE_OTHER): Payer: Medicare Other | Admitting: Family Medicine

## 2015-04-30 ENCOUNTER — Encounter: Payer: Self-pay | Admitting: Family Medicine

## 2015-04-30 VITALS — BP 98/64 | HR 72 | Temp 98.7°F | Ht 62.0 in | Wt 188.0 lb

## 2015-04-30 DIAGNOSIS — E785 Hyperlipidemia, unspecified: Secondary | ICD-10-CM

## 2015-04-30 DIAGNOSIS — N183 Chronic kidney disease, stage 3 unspecified: Secondary | ICD-10-CM | POA: Insufficient documentation

## 2015-04-30 DIAGNOSIS — N182 Chronic kidney disease, stage 2 (mild): Secondary | ICD-10-CM

## 2015-04-30 NOTE — Progress Notes (Signed)
BP 98/64 mmHg  Pulse 72  Temp(Src) 98.7 F (37.1 C) (Oral)  Ht _0  (1.575 m)  Wt 188 lb (85.276 kg)  BMI 34.38 kg/m2   Subjective:    Patient ID: Frank Levine, male    DOB: 12/26/1960, 54 y.o.   MRN: [id number]  HPI: Frank Levine is a 55 y.o. male presenting on 04/30/2015 for Medication refills and Labwork   HPI Hyperlipidemia Patient comes in today for hyperlipidemia and establish care with me. He is to see Marline Backbone here in our office. He is currently on simvastatin 40 mg and it looked very controlled last July. He is not due for labs again until July again. Patient denies headaches, blurred vision, chest pains, shortness of breath, or weakness. Denies any side effects from medication and is content with current medication.   Chronic kidney disease Patient comes in today because he needs labs drawn per the request of his urologist and nephrologist because of his chronic kidney disease and bladder thickening. They requested that we ran a basic metabolic panel.  Relevant past medical, surgical, family and social history reviewed and updated as indicated. Interim medical history since our last visit reviewed. Allergies and medications reviewed and updated.  Review of Systems  Constitutional: Negative for fever and chills.  HENT: Negative for ear discharge and ear pain.   Eyes: Negative for discharge and visual disturbance.  Respiratory: Negative for chest tightness, shortness of breath and wheezing.   Cardiovascular: Negative for chest pain and leg swelling.  Gastrointestinal: Negative for abdominal pain, diarrhea and constipation.  Genitourinary: Negative for difficulty urinating.  Musculoskeletal: Negative for back pain and gait problem.  Skin: Negative for rash.  Neurological: Negative for dizziness, syncope, light-headedness and headaches.  All other systems reviewed and are negative.   Per HPI unless specifically indicated above     Medication List       This  list is accurate as of: 04/30/15  8:32 AM.  Always use your most recent med list.               aspirin EC 81 MG tablet  Take 81 mg by mouth daily.     docusate sodium 100 MG capsule  Commonly known as:  COLACE  Take 100 mg by mouth daily as needed.     econazole nitrate 1 % cream  Apply 1 application topically 2 (two) times daily.     pantoprazole 40 MG tablet  Commonly known as:  PROTONIX  Take 1 tablet (40 mg total) by mouth daily.     simvastatin 40 MG tablet  Commonly known as:  ZOCOR  TAKE 1 TABLET (40 MG TOTAL) BY MOUTH DAILY AT 6 PM.     sulfamethoxazole-trimethoprim 400-80 MG tablet  Commonly known as:  BACTRIM,SEPTRA  Take 1 tablet by mouth daily.     tamsulosin 0.4 MG Caps capsule  Commonly known as:  FLOMAX  TAKE 1 CAPSULE (0.4 MG TOTAL) BY MOUTH DAILY.           Objective:    BP 98/64 mmHg  Pulse 72  Temp(Src) 98.7 F (37.1 C) (Oral)  Ht _1  (1.575 m)  Wt 188 lb (85.276 kg)  BMI 34.38 kg/m2  Wt Readings from Last 3 Encounters:  04/30/15 188 lb (85.276 kg)  12/02/14 186 lb (84.369 kg)  11/19/14 192 lb (87.091 kg)    Physical Exam  Constitutional: He is oriented to person, place, and time. He appears well-developed and well-nourished.  No distress.  Eyes: Conjunctivae and EOM are normal. Pupils are equal, round, and reactive to light. Right eye exhibits no discharge. No scleral icterus.  Neck: Neck supple. No thyromegaly present.  Cardiovascular: Normal rate, regular rhythm, normal heart sounds and intact distal pulses.   No murmur heard. Pulmonary/Chest: Effort normal and breath sounds normal. No respiratory distress. He has no wheezes.  Abdominal: He exhibits no distension. There is no tenderness. There is no rebound.  Musculoskeletal: Normal range of motion. He exhibits no edema.  Lymphadenopathy:    He has no cervical adenopathy.  Neurological: He is alert and oriented to person, place, and time. Coordination normal.  Skin: Skin is warm and  dry. No rash noted. He is not diaphoretic.  Psychiatric: He has a normal mood and affect. His behavior is normal.  Vitals reviewed.   Results for orders placed or performed in visit on 11/19/14  Urine culture  Result Value Ref Range   Urine Culture, Routine Final report (A)    Urine Culture result 1 Comment (A)    RESULT 2 Comment    ANTIMICROBIAL SUSCEPTIBILITY Comment       Assessment & Plan:   Problem List Items Addressed This Visit      Genitourinary   Chronic kidney disease (CKD), stage II (mild)   Relevant Orders   BMP8+EGFR     Other   Hyperlipidemia - Primary       Follow up plan: Return in about 6 months (around 10/28/2015), or if symptoms worsen or fail to improve, for Cholesterol recheck.  Counseling provided for all of the vaccine components Orders Placed This Encounter  Procedures  . BMP8+EGFR    Caryl Pina, MD [facility] 04/30/2015, 8:32 AM

## 2015-05-01 LAB — BMP8+EGFR
BUN / CREAT RATIO: 8 — AB (ref 9–20)
BUN: 15 mg/dL (ref 6–24)
CHLORIDE: 104 mmol/L (ref 96–106)
CO2: 25 mmol/L (ref 18–29)
Calcium: 8.8 mg/dL (ref 8.7–10.2)
Creatinine, Ser: 1.97 mg/dL — ABNORMAL HIGH (ref 0.76–1.27)
GFR calc non Af Amer: 37 mL/min/{1.73_m2} — ABNORMAL LOW (ref >59)
GFR, EST AFRICAN AMERICAN: 43 mL/min/{1.73_m2} — AB (ref >59)
Glucose: 93 mg/dL (ref 65–99)
Potassium: 5.5 mmol/L — ABNORMAL HIGH (ref 3.5–5.2)
SODIUM: 143 mmol/L (ref 134–144)

## 2015-05-11 DIAGNOSIS — N1330 Unspecified hydronephrosis: Secondary | ICD-10-CM | POA: Diagnosis not present

## 2015-05-11 DIAGNOSIS — Z0000 Encounter for general adult medical examination without abnormal findings: Secondary | ICD-10-CM | POA: Diagnosis not present

## 2015-05-11 DIAGNOSIS — N3020 Other chronic cystitis without hematuria: Secondary | ICD-10-CM | POA: Diagnosis not present

## 2015-05-12 ENCOUNTER — Ambulatory Visit: Payer: Medicare Other | Admitting: Family

## 2015-05-13 ENCOUNTER — Other Ambulatory Visit: Payer: Self-pay | Admitting: Urology

## 2015-05-19 ENCOUNTER — Other Ambulatory Visit: Payer: Self-pay | Admitting: Family

## 2015-05-20 ENCOUNTER — Other Ambulatory Visit: Payer: Self-pay | Admitting: Family

## 2015-05-21 NOTE — Telephone Encounter (Signed)
Last seen 04/30/15 Dr Dettinger  Last lipid 10/02/14

## 2015-05-29 ENCOUNTER — Encounter (HOSPITAL_BASED_OUTPATIENT_CLINIC_OR_DEPARTMENT_OTHER): Payer: Self-pay | Admitting: *Deleted

## 2015-05-29 NOTE — Progress Notes (Addendum)
SPOKE W/ PT'S MOTHER, WHOM IS GUARDIAN (PT HAS DOWN'S BUT HIGH FUNCTION).   NPO AFTER MN.  ARRIVE AT 0915.  NEEDS ISTAT 8.  WILL TAKE FLOMAX AND PROTONIX AM DOS W/ SIPS OF WATER.   NOTED TIME CHANGE ON SCHEDULE.  LM ON HOME PHONE FOR PT TO ARRIVE AT [id number].

## 2015-06-02 NOTE — H&P (Signed)
History of Present Illness   Frank Levine returns today for a followup visit. Again, his complicated situation is outlined in his previous notes. There does not appear to be any obvious dramatic clinical change. He did have some recent blood work which showed that his creatinine has increased further to 1.97 and the potassium was a little bit elevated. Ultrasound continues to show severe bilateral hydronephrosis. Formal results are dictated below. Frank Levine urine also does show some bacteria and pyuria today.      Past Medical History Problems  1. History of hypercholesterolemia (Z86.39) 2. History of Venous Thrombosis Of Deep Vessels Of Lower Extremity  Surgical History Problems  1. History of Cystoscopy For Urethral Stricture 2. History of Cystoscopy For Urethral Stricture 3. History of Cystoscopy With Biopsy 4. History of Cystoscopy With Insertion Of Ureteral Stent Bilateral 5. History of Cystoscopy With Insertion Of Ureteral Stent Left 6. History of Cystoscopy With Insertion Of Ureteral Stent Right 7. History of Cystoscopy With Ureteroscopy Left 8. History of Cystoscopy With Ureteroscopy Left 9. History of Cystoscopy With Ureteroscopy Left 10. History of Cystoscopy With Ureteroscopy Left 11. History of Eye Surgery  Current Meds 1. Aspirin 81 MG TABS; 1 per day;  Therapy: (Recorded:17Jul2012) to Recorded 2. Pantoprazole Sodium 40 MG Oral Tablet Delayed Release;  Therapy: [id number] to Recorded 3. Simvastatin 40 MG Oral Tablet; 1at night;  Therapy: (Recorded:20Mar2012) to Recorded 4. Stool Softener CAPS; 1 per day;  Therapy: (Recorded:20Mar2012) to Recorded 5. Sulfamethoxazole-Trimethoprim 400-80 MG Oral Tablet; TAKE ONE TABLET BY MOUTH  AT BEDTIME;  Therapy: [id number] to (Last [id number])  Requested for: [id number] Ordered 6. Tamsulosin HCl - 0.4 MG Oral Capsule; TAKE ONE CAPSULE BY MOUTH ONE TIME  DAILY;  Therapy: [id number] to (Last Rx:10Feb2015)  Requested for: [id number]  Ordered  Allergies Medication  1. No Known Drug Allergies  Family History Problems  1. Family history of Esophageal Cancer : Mother 2. Family history of Family Health Status - Father's Age : Mother   22 3. Family history of Family Health Status - Mother's Age : Mother   18 4. Family history of Hypertension  Social History Problems  1. Denied: History of Alcohol Use (History) 2. Caffeine Use 3. Marital History - Single 4. Never A Smoker 5. Denied: History of Tobacco Use  Review of Systems Genitourinary, constitutional, skin, eye, otolaryngeal, hematologic/lymphatic, cardiovascular, pulmonary, endocrine, musculoskeletal, gastrointestinal, neurological and psychiatric system(s) were reviewed and pertinent findings if present are noted and are otherwise negative.  Genitourinary: weak urinary stream, but no dysuria.    Vitals Vital Signs [Data Includes: Last 1 Day]  Recorded: [id number] 12:01PM  Blood Pressure: 86 / 55 Temperature: 97.9 F Heart Rate: 73  Well-developed and well-nourished male with Down syndrome appearance Respiratory: Normal effort Cardiac: Regular rate and rhythm Abdomen: Soft nontender questionable bladder distention no palpable masses Extremities: No tenderness or edema Neuro: Nonfocal  Results/Data    On renal ultrasonography, the right kidney was 12.9 cm and the left kidney was 11.9 cm. Similar to previous, there was significant hydronephrosis with dilation of the collecting system and proximal ureter bilaterally. Postvoid residual today was just over 300 mL.   Urine [Data Includes: Last 1 Day]   [id number] COLOR YELLOW  APPEARANCE CLOUDY  SPECIFIC GRAVITY 1.010  pH 6.5  GLUCOSE NEGATIVE  BILIRUBIN NEGATIVE  KETONE NEGATIVE  BLOOD 1+  PROTEIN NEGATIVE  NITRITE NEGATIVE  LEUKOCYTE ESTERASE 3+  SQUAMOUS EPITHELIAL/HPF NONE SEEN HPF WBC >60 WBC/HPF RBC 3-10 RBC/HPF BACTERIA MANY HPF CRYSTALS NONE SEEN  HPF CASTS NONE SEEN LPF Yeast NONE  SEEN HPF  Assessment Assessed  1. Chronic cystitis (N30.20) 2. Hydronephrosis (N13.30) 3. Urethral stricture (N35.9)  Plan Chronic cystitis  1. Follow-up Schedule Surgery Office  Follow-up  Status: Hold For - Appointment   Requested for: [id number] 2. URINE CULTURE; Status:In Progress - Specimen/Data Collected;   Done: [id number]  Discussion/Summary   Frank Levine continues to have bilateral hydronephrosis. He also has a chronically distended bladder and urethral stricture disease. His one ureter clearly refluxes. 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 It has always been somewhat unclear how much obstructive uropathy plays a role in his elevated creatinine. It has improved at times but now is creeping back up. At this point I do think we need to do another trial of catheter drainage to see if that improves his renal function. I think he might be better off having a suprapubic tube; therefore, I proposed a cysto balloon dilation, placement of the suprapubic tube, and then reassessment of his clinical situation and creatinine in a couple of months. If he tolerates the catheter well and his creatinine comes down, then a suprapubic tube may be a good option for him. We could consider sending him to Dr Odis Luster at [facility] for consideration of formal urethral stricture repair. There will be several other options as well which we can discuss, but I would like to start by getting his bladder decompressed and see how his renal function responds. His urine today is suspicious for recurrent cystitis, and a culture will be done with appropriate antibiotics called in once those results are available.      Signatures Electronically signed by : Rana Snare, M.D.; May 11 2015  4:47PM EST

## 2015-06-03 ENCOUNTER — Ambulatory Visit (HOSPITAL_BASED_OUTPATIENT_CLINIC_OR_DEPARTMENT_OTHER): Payer: Medicare Other | Admitting: Anesthesiology

## 2015-06-03 ENCOUNTER — Encounter (HOSPITAL_BASED_OUTPATIENT_CLINIC_OR_DEPARTMENT_OTHER)
Admission: RE | Disposition: A | Discharge status: Home / Self Care | Payer: Self-pay | Source: Ambulatory Visit | Attending: Urology

## 2015-06-03 ENCOUNTER — Encounter (HOSPITAL_BASED_OUTPATIENT_CLINIC_OR_DEPARTMENT_OTHER): Payer: Self-pay | Admitting: *Deleted

## 2015-06-03 ENCOUNTER — Ambulatory Visit (HOSPITAL_BASED_OUTPATIENT_CLINIC_OR_DEPARTMENT_OTHER)
Admission: RE | Admit: 2015-06-03 | Discharge: 2015-06-03 | Disposition: A | Discharge status: Home / Self Care | Payer: Medicare Other | Source: Ambulatory Visit | Attending: Urology | Admitting: Urology

## 2015-06-03 DIAGNOSIS — E785 Hyperlipidemia, unspecified: Secondary | ICD-10-CM | POA: Insufficient documentation

## 2015-06-03 DIAGNOSIS — Q909 Down syndrome, unspecified: Secondary | ICD-10-CM | POA: Insufficient documentation

## 2015-06-03 DIAGNOSIS — N358 Other urethral stricture: Secondary | ICD-10-CM | POA: Diagnosis not present

## 2015-06-03 DIAGNOSIS — N3020 Other chronic cystitis without hematuria: Secondary | ICD-10-CM | POA: Diagnosis not present

## 2015-06-03 DIAGNOSIS — E669 Obesity, unspecified: Secondary | ICD-10-CM | POA: Diagnosis not present

## 2015-06-03 DIAGNOSIS — Z79899 Other long term (current) drug therapy: Secondary | ICD-10-CM | POA: Insufficient documentation

## 2015-06-03 DIAGNOSIS — N179 Acute kidney failure, unspecified: Secondary | ICD-10-CM | POA: Diagnosis not present

## 2015-06-03 DIAGNOSIS — N359 Urethral stricture, unspecified: Secondary | ICD-10-CM | POA: Insufficient documentation

## 2015-06-03 DIAGNOSIS — E7800 Pure hypercholesterolemia, unspecified: Secondary | ICD-10-CM | POA: Insufficient documentation

## 2015-06-03 DIAGNOSIS — Z7982 Long term (current) use of aspirin: Secondary | ICD-10-CM | POA: Insufficient documentation

## 2015-06-03 DIAGNOSIS — N400 Enlarged prostate without lower urinary tract symptoms: Secondary | ICD-10-CM | POA: Diagnosis not present

## 2015-06-03 DIAGNOSIS — Z86718 Personal history of other venous thrombosis and embolism: Secondary | ICD-10-CM | POA: Diagnosis not present

## 2015-06-03 DIAGNOSIS — Z6834 Body mass index (BMI) 34.0-34.9, adult: Secondary | ICD-10-CM | POA: Insufficient documentation

## 2015-06-03 DIAGNOSIS — K219 Gastro-esophageal reflux disease without esophagitis: Secondary | ICD-10-CM | POA: Insufficient documentation

## 2015-06-03 DIAGNOSIS — N1330 Unspecified hydronephrosis: Secondary | ICD-10-CM | POA: Diagnosis not present

## 2015-06-03 DIAGNOSIS — I739 Peripheral vascular disease, unspecified: Secondary | ICD-10-CM | POA: Diagnosis not present

## 2015-06-03 HISTORY — PX: BALLOON DILATION: SHX5330

## 2015-06-03 HISTORY — DX: Chronic kidney disease, stage 2 (mild): N18.2

## 2015-06-03 HISTORY — PX: CYSTOSCOPY W/ RETROGRADES: SHX1426

## 2015-06-03 HISTORY — PX: INSERTION OF SUPRAPUBIC CATHETER: SHX5870

## 2015-06-03 HISTORY — DX: Unspecified hydronephrosis: N13.30

## 2015-06-03 LAB — POCT I-STAT, CHEM 8
BUN: 16 mg/dL (ref 6–20)
CALCIUM ION: 1.2 mmol/L (ref 1.12–1.23)
Chloride: 104 mmol/L (ref 101–111)
Creatinine, Ser: 1.9 mg/dL — ABNORMAL HIGH (ref 0.61–1.24)
Glucose, Bld: 91 mg/dL (ref 65–99)
HCT: 42 % (ref 39.0–52.0)
Hemoglobin: 14.3 g/dL (ref 13.0–17.0)
Potassium: 4.4 mmol/L (ref 3.5–5.1)
Sodium: 144 mmol/L (ref 135–145)
TCO2: 26 mmol/L (ref 0–100)

## 2015-06-03 SURGERY — CYSTOSCOPY, WITH RETROGRADE PYELOGRAM
Anesthesia: General | Laterality: Right

## 2015-06-03 MED ORDER — ONDANSETRON HCL 4 MG/2ML IJ SOLN
INTRAMUSCULAR | Status: AC
Start: 2015-06-03 — End: 2015-06-03
  Filled 2015-06-03: qty 2

## 2015-06-03 MED ORDER — BUPIVACAINE HCL 0.25 % IJ SOLN
INTRAMUSCULAR | Status: DC | PRN
Start: 2015-06-03 — End: 2015-06-03
  Administered 2015-06-03: 5 mL

## 2015-06-03 MED ORDER — LACTATED RINGERS IV SOLN
INTRAVENOUS | Status: DC
  Administered 2015-06-03 (×2): via INTRAVENOUS
  Filled 2015-06-03: qty 1000

## 2015-06-03 MED ORDER — MIDAZOLAM HCL 2 MG/2ML IJ SOLN
INTRAMUSCULAR | Status: AC
  Filled 2015-06-03: qty 2

## 2015-06-03 MED ORDER — KETOROLAC TROMETHAMINE 30 MG/ML IJ SOLN
INTRAMUSCULAR | Status: AC
  Filled 2015-06-03: qty 1

## 2015-06-03 MED ORDER — DEXAMETHASONE SODIUM PHOSPHATE 4 MG/ML IJ SOLN
INTRAMUSCULAR | Status: DC | PRN
  Administered 2015-06-03: 10 mg via INTRAVENOUS

## 2015-06-03 MED ORDER — DEXAMETHASONE SODIUM PHOSPHATE 10 MG/ML IJ SOLN
INTRAMUSCULAR | Status: AC
  Filled 2015-06-03: qty 1

## 2015-06-03 MED ORDER — ONDANSETRON HCL 4 MG/2ML IJ SOLN
4.0000 mg | Freq: Once | INTRAMUSCULAR | Status: DC | PRN
  Filled 2015-06-03: qty 2

## 2015-06-03 MED ORDER — LIDOCAINE HCL (CARDIAC) 20 MG/ML IV SOLN
INTRAVENOUS | Status: DC | PRN
  Administered 2015-06-03: 60 mg via INTRAVENOUS

## 2015-06-03 MED ORDER — FENTANYL CITRATE (PF) 100 MCG/2ML IJ SOLN
INTRAMUSCULAR | Status: AC
  Filled 2015-06-03: qty 2

## 2015-06-03 MED ORDER — KETOROLAC TROMETHAMINE 30 MG/ML IJ SOLN
INTRAMUSCULAR | Status: DC | PRN
  Administered 2015-06-03: 30 mg via INTRAVENOUS

## 2015-06-03 MED ORDER — DEXTROSE 5 % IV SOLN
2.0000 g | INTRAVENOUS | Status: AC
  Administered 2015-06-03: 2 g via INTRAVENOUS
  Filled 2015-06-03: qty 20

## 2015-06-03 MED ORDER — CEFAZOLIN SODIUM-DEXTROSE 2-3 GM-% IV SOLR
INTRAVENOUS | Status: AC
  Filled 2015-06-03: qty 50

## 2015-06-03 MED ORDER — HYDROCODONE-ACETAMINOPHEN 7.5-325 MG PO TABS
1.0000 | ORAL_TABLET | Freq: Once | ORAL | Status: DC | PRN
  Filled 2015-06-03: qty 1

## 2015-06-03 MED ORDER — PROPOFOL 10 MG/ML IV BOLUS
INTRAVENOUS | Status: AC
  Filled 2015-06-03: qty 20

## 2015-06-03 MED ORDER — FENTANYL CITRATE (PF) 100 MCG/2ML IJ SOLN
25.0000 ug | INTRAMUSCULAR | Status: DC | PRN
  Filled 2015-06-03: qty 1

## 2015-06-03 MED ORDER — IOPAMIDOL (ISOVUE-370) INJECTION 76%
INTRAVENOUS | Status: DC | PRN
  Administered 2015-06-03: 10 mL

## 2015-06-03 MED ORDER — PROPOFOL 10 MG/ML IV BOLUS
INTRAVENOUS | Status: DC | PRN
  Administered 2015-06-03: 150 mg via INTRAVENOUS

## 2015-06-03 MED ORDER — LIDOCAINE HCL 2 % EX GEL
CUTANEOUS | Status: DC | PRN
  Administered 2015-06-03: 1 via URETHRAL

## 2015-06-03 MED ORDER — STERILE WATER FOR IRRIGATION IR SOLN
Status: DC | PRN
  Administered 2015-06-03: 3000 mL via INTRAVESICAL

## 2015-06-03 MED ORDER — MEPERIDINE HCL 25 MG/ML IJ SOLN
6.2500 mg | INTRAMUSCULAR | Status: DC | PRN
  Filled 2015-06-03: qty 1

## 2015-06-03 MED ORDER — MIDAZOLAM HCL 5 MG/5ML IJ SOLN
INTRAMUSCULAR | Status: DC | PRN
  Administered 2015-06-03: 1 mg via INTRAVENOUS

## 2015-06-03 MED ORDER — FENTANYL CITRATE (PF) 100 MCG/2ML IJ SOLN
INTRAMUSCULAR | Status: DC | PRN
  Administered 2015-06-03: 50 ug via INTRAVENOUS

## 2015-06-03 MED ORDER — ONDANSETRON HCL 4 MG/2ML IJ SOLN
INTRAMUSCULAR | Status: DC | PRN
  Administered 2015-06-03: 4 mg via INTRAVENOUS

## 2015-06-03 MED ORDER — LIDOCAINE HCL (CARDIAC) 20 MG/ML IV SOLN
INTRAVENOUS | Status: AC
  Filled 2015-06-03: qty 5

## 2015-06-03 SURGICAL SUPPLY — 51 items
ADAPTER CATH URET PLST 4-6FR (CATHETERS) IMPLANT
ADPR CATH URET STRL DISP 4-6FR (CATHETERS)
APL SKNCLS STERI-STRIP NONHPOA (GAUZE/BANDAGES/DRESSINGS)
BAG DRAIN URO-CYSTO SKYTR STRL (DRAIN) ×3 IMPLANT
BAG DRN UROCATH (DRAIN) ×2
BAG URINE DRAINAGE (UROLOGICAL SUPPLIES) ×2 IMPLANT
BAG URINE LEG 500ML (DRAIN) ×2 IMPLANT
BALLN NEPHROSTOMY (BALLOONS) ×3
BALLOON NEPHROSTOMY (BALLOONS) IMPLANT
BENZOIN TINCTURE PRP APPL 2/3 (GAUZE/BANDAGES/DRESSINGS) IMPLANT
BLADE SURG 15 STRL LF DISP TIS (BLADE) ×2 IMPLANT
BLADE SURG 15 STRL SS (BLADE) ×3
CATH FOLEY 2WAY SLVR  5CC 16FR (CATHETERS) ×1
CATH FOLEY 2WAY SLVR 5CC 16FR (CATHETERS) IMPLANT
CATH FOLEY INTRO SUPRA 16F (CATHETERS) ×1 IMPLANT
CATH INTERMIT  6FR 70CM (CATHETERS) ×1 IMPLANT
CATH URET 5FR 28IN CONE TIP (BALLOONS)
CATH URET 5FR 28IN OPEN ENDED (CATHETERS) IMPLANT
CATH URET 5FR 70CM CONE TIP (BALLOONS) IMPLANT
CLOTH BEACON ORANGE TIMEOUT ST (SAFETY) ×3 IMPLANT
DRSG TEGADERM 2-3/8X2-3/4 SM (GAUZE/BANDAGES/DRESSINGS) IMPLANT
DRSG TEGADERM 4X4.75 (GAUZE/BANDAGES/DRESSINGS) ×1 IMPLANT
ELECT REM PT RETURN 9FT ADLT (ELECTROSURGICAL) ×3
ELECTRODE REM PT RTRN 9FT ADLT (ELECTROSURGICAL) ×2 IMPLANT
GLOVE BIO SURGEON STRL SZ7.5 (GLOVE) ×3 IMPLANT
GOWN STRL REUS W/ TWL XL LVL3 (GOWN DISPOSABLE) ×2 IMPLANT
GOWN STRL REUS W/TWL XL LVL3 (GOWN DISPOSABLE) ×9 IMPLANT
GUIDEWIRE 0.038 PTFE COATED (WIRE) IMPLANT
GUIDEWIRE ANG ZIPWIRE 038X150 (WIRE) IMPLANT
GUIDEWIRE STR DUAL SENSOR (WIRE) ×1 IMPLANT
KIT BALLIN UROMAX 15FX10 (LABEL) IMPLANT
KIT BALLN UROMAX 15FX4 (MISCELLANEOUS) IMPLANT
KIT BALLN UROMAX 26 75X4 (MISCELLANEOUS)
KIT ROOM TURNOVER WOR (KITS) ×3 IMPLANT
KIT SUPRAPUBIC CATH (MISCELLANEOUS) ×3 IMPLANT
MANIFOLD NEPTUNE II (INSTRUMENTS) ×1 IMPLANT
NDL SPNL 22GX7 QUINCKE BK (NEEDLE) IMPLANT
NEEDLE HYPO 22GX1.5 SAFETY (NEEDLE) ×1 IMPLANT
NEEDLE SPNL 22GX7 QUINCKE BK (NEEDLE) ×3 IMPLANT
NS IRRIG 500ML POUR BTL (IV SOLUTION) IMPLANT
PACK CYSTO (CUSTOM PROCEDURE TRAY) ×3 IMPLANT
PENCIL BUTTON HOLSTER BLD 10FT (ELECTRODE) IMPLANT
PLUG CATH AND CAP STER (CATHETERS) ×1 IMPLANT
SET HIGH PRES BAL DIL (LABEL)
SPONGE GAUZE 4X4 12PLY STER LF (GAUZE/BANDAGES/DRESSINGS) ×1 IMPLANT
SUT ETHILON 3 0 FSL (SUTURE) ×3 IMPLANT
SYRINGE 10CC LL (SYRINGE) ×1 IMPLANT
TOWEL OR 17X24 6PK STRL BLUE (TOWEL DISPOSABLE) ×3 IMPLANT
TUBE CONNECTING 12X1/4 (SUCTIONS) ×1 IMPLANT
WATER STERILE IRR 3000ML UROMA (IV SOLUTION) ×3 IMPLANT
WATER STERILE IRR 500ML POUR (IV SOLUTION) ×3 IMPLANT

## 2015-06-03 NOTE — Anesthesia Postprocedure Evaluation (Signed)
Anesthesia Post Note  Patient: ANAHI CHORLEY  Procedure(s) Performed: Procedure(s) (LRB): CYSTOSCOPY WITH RIGHT RETROGRADE PYELOGRAM CYSTOGRAM (Right) INSERTION OF SUPRAPUBIC CATHETER (N/A) BALLOON DILATION (N/A)  Patient location during evaluation: PACU Anesthesia Type: General Level of consciousness: awake and alert and oriented Pain management: pain level controlled Vital Signs Assessment: post-procedure vital signs reviewed and stable Respiratory status: spontaneous breathing, nonlabored ventilation and respiratory function stable Cardiovascular status: blood pressure returned to baseline and stable Postop Assessment: no signs of nausea or vomiting Anesthetic complications: no    Last Vitals:  Filed Vitals:   06/03/15 1145 06/03/15 1200  BP:  116/78  Pulse: 74 80  Temp:    Resp: 14 18    Last Pain:  Filed Vitals:   06/03/15 1209  PainSc: Asleep                 Jeroline Wolbert A.

## 2015-06-03 NOTE — Transfer of Care (Signed)
Immediate Anesthesia Transfer of Care Note  Patient: GIULIAN ASSI  Procedure(s) Performed: Procedure(s) (LRB): CYSTOSCOPY WITH RIGHT RETROGRADE PYELOGRAM CYSTOGRAM (Right) INSERTION OF SUPRAPUBIC CATHETER (N/A) BALLOON DILATION (N/A)  Patient Location: PACU  Anesthesia Type: General  Level of Consciousness: awake, oriented, sedated and patient cooperative  Airway & Oxygen Therapy: Patient Spontanous Breathing and Patient connected to face mask oxygen  Post-op Assessment: Report given to PACU RN and Post -op Vital signs reviewed and stable  Post vital signs: Reviewed and stable  Complications: No apparent anesthesia complications Last Vitals:  Filed Vitals:   06/03/15 0853 06/03/15 1134  BP: 115/66 105/73  Pulse: 80 73  Temp: 36.9 C 36.6 C  Resp: 18 12

## 2015-06-03 NOTE — Anesthesia Preprocedure Evaluation (Signed)
Anesthesia Evaluation  Patient identified by MRN, date of birth, ID band Patient awake    Reviewed: Allergy & Precautions, NPO status , Patient's Chart, lab work & pertinent test results  Airway Mallampati: II  TM Distance: >3 FB Neck ROM: Full    Dental  (+) Teeth Intact   Pulmonary neg pulmonary ROS,    Pulmonary exam normal breath sounds clear to auscultation       Cardiovascular + Peripheral Vascular Disease  Normal cardiovascular exam Rhythm:Regular Rate:Normal  Hx/o PTE in 2011 after Urological procedure   Neuro/Psych PSYCHIATRIC DISORDERS Anxiety Down's Syndrome    GI/Hepatic GERD  Controlled,  Endo/Other  Hyperlipidemia  Renal/GU Renal InsufficiencyRenal disease Bladder dysfunction  Chronic Cystitis with urethral stricture and bilateral hydronephrosis BPH    Musculoskeletal  (+) Arthritis , Osteoarthritis,    Abdominal (+) + obese,   Peds  Hematology   Anesthesia Other Findings   Reproductive/Obstetrics                             Anesthesia Physical Anesthesia Plan  ASA: II  Anesthesia Plan: General   Post-op Pain Management:    Induction: Intravenous  Airway Management Planned: LMA  Additional Equipment:   Intra-op Plan:   Post-operative Plan: Extubation in OR  Informed Consent: I have reviewed the patients History and Physical, chart, labs and discussed the procedure including the risks, benefits and alternatives for the proposed anesthesia with the patient or authorized representative who has indicated his/her understanding and acceptance.   Dental advisory given  Plan Discussed with: CRNA, Anesthesiologist and Surgeon  Anesthesia Plan Comments:         Anesthesia Quick Evaluation

## 2015-06-03 NOTE — Interval H&P Note (Signed)
History and Physical Interval Note:  06/03/2015 9:41 AM  Frank Levine  has presented today for surgery, with the diagnosis of URETHRAL STRICTURE, BILATERAL HYDRONEPHROSIS  The various methods of treatment have been discussed with the patient and family. After consideration of risks, benefits and other options for treatment, the patient has consented to  Procedure(s): CYSTOSCOPY WITH POSSIBLE RIGHT RETROGRADE PYELOGRAM CYSTOGRAM (Right) INSERTION OF SUPRAPUBIC CATHETER (N/A) BALLOON DILATION (N/A) as a surgical intervention .  The patient's history has been reviewed, patient examined, no change in status, stable for surgery.  I have reviewed the patient's chart and labs.  Questions were answered to the patient's satisfaction.     Tim Wilhide S

## 2015-06-03 NOTE — Discharge Instructions (Addendum)

## 2015-06-03 NOTE — Op Note (Signed)
Preoperative diagnosis: Urethral stricture, bilateral hydronephrosis, acute renal failure Postoperative diagnosis: Same  Procedure: Cystoscopy, balloon dilation of urethral stricture, suprapubic tube placement   Surgeon: Bernestine Amass M.D.  Anesthesia: Gen.  Indications: Frank Levine is currently 55 years of age. He has Down syndrome. He has been followed by myself for a number of years with significant bilateral hydronephrosis. He has documented severe reflux on the left and what is probably a nonobstructing nonrefluxing megaureter on the right. He is had recurrent urethral stricture disease and often has a postvoid residual in the 3-500 mL range. He has intermittently had some chronic renal insufficiency. Part of his renal insufficiency is probably obstructive in nature. He is here today for reassessment. Our plan is to redilate the urethral stricture and place a suprapubic tube to see if improve bladder drainage does improve his overall renal function and less in the degree of hydronephrosis.     Technique and findings: Patient was brought the operating room where he had successful induction of general anesthesia. He was placed in lithotomy position and prepped and draped in the usual manner. Appropriate surgical timeout was performed. Cystoscopically he had a short but tight recurrent bulbar urethral stricture. Guidewire was placed through the stricture and into the bladder with fluoroscopic guidance. The stricture was then dilated to 24 [place] utilizing a fascial dilating balloon with fluoroscopic guidance and interpretation.   At the completion of the urethral dilation cystoscopy was repeated. The prostatic urethra and bladder was otherwise endoscopically unremarkable. The bladder was filled. With a spinal needle we located the dome of the bladder. A punch trocar was then used at the dome of the bladder and a 16 French suprapubic tube was inserted without difficulty. This was secured to the anterior  abdominal wall. No obvious complications occurred the patient was brought to PACU in stable condition.

## 2015-06-03 NOTE — Anesthesia Procedure Notes (Signed)
Procedure Name: LMA Insertion Date/Time: 06/03/2015 10:54 AM Performed by: Denna Haggard D Pre-anesthesia Checklist: Patient identified, Emergency Drugs available, Suction available and Patient being monitored Patient Re-evaluated:Patient Re-evaluated prior to inductionOxygen Delivery Method: Circle System Utilized Preoxygenation: Pre-oxygenation with 100% oxygen Intubation Type: IV induction Ventilation: Mask ventilation without difficulty LMA: LMA inserted LMA Size: 4.0 Number of attempts: 1 Airway Equipment and Method: Bite block Placement Confirmation: positive ETCO2 Tube secured with: Tape Dental Injury: Teeth and Oropharynx as per pre-operative assessment

## 2015-06-04 ENCOUNTER — Encounter (HOSPITAL_BASED_OUTPATIENT_CLINIC_OR_DEPARTMENT_OTHER): Payer: Self-pay | Admitting: Urology

## 2015-07-13 DIAGNOSIS — N401 Enlarged prostate with lower urinary tract symptoms: Secondary | ICD-10-CM | POA: Diagnosis not present

## 2015-07-13 DIAGNOSIS — Z0000 Encounter for general adult medical examination without abnormal findings: Secondary | ICD-10-CM | POA: Diagnosis not present

## 2015-07-13 DIAGNOSIS — N3020 Other chronic cystitis without hematuria: Secondary | ICD-10-CM | POA: Diagnosis not present

## 2015-07-13 DIAGNOSIS — R338 Other retention of urine: Secondary | ICD-10-CM | POA: Diagnosis not present

## 2015-07-21 DIAGNOSIS — M79676 Pain in unspecified toe(s): Secondary | ICD-10-CM | POA: Diagnosis not present

## 2015-07-21 DIAGNOSIS — B351 Tinea unguium: Secondary | ICD-10-CM | POA: Diagnosis not present

## 2015-07-30 DIAGNOSIS — N390 Urinary tract infection, site not specified: Secondary | ICD-10-CM | POA: Diagnosis not present

## 2015-07-30 DIAGNOSIS — Z0000 Encounter for general adult medical examination without abnormal findings: Secondary | ICD-10-CM | POA: Diagnosis not present

## 2015-07-30 DIAGNOSIS — R3989 Other symptoms and signs involving the genitourinary system: Secondary | ICD-10-CM | POA: Diagnosis not present

## 2015-07-30 DIAGNOSIS — B961 Klebsiella pneumoniae [K. pneumoniae] as the cause of diseases classified elsewhere: Secondary | ICD-10-CM | POA: Diagnosis not present

## 2015-08-14 ENCOUNTER — Telehealth: Payer: Self-pay | Admitting: Family Medicine

## 2015-08-14 ENCOUNTER — Other Ambulatory Visit: Payer: Self-pay | Admitting: Family Medicine

## 2015-08-14 ENCOUNTER — Other Ambulatory Visit: Payer: Medicare Other

## 2015-08-14 DIAGNOSIS — R300 Dysuria: Secondary | ICD-10-CM

## 2015-08-14 DIAGNOSIS — N3000 Acute cystitis without hematuria: Secondary | ICD-10-CM

## 2015-08-14 LAB — URINALYSIS, COMPLETE
BILIRUBIN UA: NEGATIVE
GLUCOSE, UA: NEGATIVE
KETONES UA: NEGATIVE
Nitrite, UA: NEGATIVE
PROTEIN UA: NEGATIVE
SPEC GRAV UA: 1.015 (ref 1.005–1.030)
Urobilinogen, Ur: 0.2 mg/dL (ref 0.2–1.0)
pH, UA: 7 (ref 5.0–7.5)

## 2015-08-14 LAB — MICROSCOPIC EXAMINATION
Bacteria, UA: NONE SEEN
EPITHELIAL CELLS (NON RENAL): NONE SEEN /HPF (ref 0–10)
WBC, UA: >30 /hpf — AB (ref 0–?)

## 2015-08-14 MED ORDER — CIPROFLOXACIN HCL 500 MG PO TABS: 500.0000 mg | ORAL_TABLET | Freq: Two times a day (BID) | ORAL | Status: DC

## 2015-08-14 NOTE — Telephone Encounter (Signed)
Spoke with mother and told her that we have placed an order to have his urine rechecked, explained to her that we would be here until 7pm tonight and from 8-12 on Saturday.  She said it would probably be tomorrow before she could bring him in.

## 2015-08-15 LAB — URINE CULTURE

## 2015-08-25 ENCOUNTER — Encounter (HOSPITAL_COMMUNITY): Payer: Self-pay | Admitting: Emergency Medicine

## 2015-08-25 ENCOUNTER — Emergency Department (HOSPITAL_COMMUNITY): Payer: Medicare Other

## 2015-08-25 ENCOUNTER — Emergency Department (HOSPITAL_COMMUNITY)
Admission: EM | Admit: 2015-08-25 | Discharge: 2015-08-26 | Disposition: A | Discharge status: Home / Self Care | Payer: Medicare Other | Attending: Emergency Medicine | Admitting: Emergency Medicine

## 2015-08-25 DIAGNOSIS — Z7982 Long term (current) use of aspirin: Secondary | ICD-10-CM | POA: Insufficient documentation

## 2015-08-25 DIAGNOSIS — E785 Hyperlipidemia, unspecified: Secondary | ICD-10-CM | POA: Diagnosis not present

## 2015-08-25 DIAGNOSIS — K5900 Constipation, unspecified: Secondary | ICD-10-CM | POA: Diagnosis not present

## 2015-08-25 DIAGNOSIS — R338 Other retention of urine: Secondary | ICD-10-CM | POA: Diagnosis not present

## 2015-08-25 DIAGNOSIS — Z79899 Other long term (current) drug therapy: Secondary | ICD-10-CM | POA: Diagnosis not present

## 2015-08-25 DIAGNOSIS — N182 Chronic kidney disease, stage 2 (mild): Secondary | ICD-10-CM | POA: Diagnosis not present

## 2015-08-25 DIAGNOSIS — R109 Unspecified abdominal pain: Secondary | ICD-10-CM | POA: Diagnosis present

## 2015-08-25 DIAGNOSIS — R1030 Lower abdominal pain, unspecified: Secondary | ICD-10-CM | POA: Diagnosis not present

## 2015-08-25 LAB — CBC
HCT: 44 % (ref 39.0–52.0)
HEMOGLOBIN: 15.1 g/dL (ref 13.0–17.0)
MCH: 34.3 pg — ABNORMAL HIGH (ref 26.0–34.0)
MCHC: 34.3 g/dL (ref 30.0–36.0)
MCV: 100 fL (ref 78.0–100.0)
Platelets: 261 10*3/uL (ref 150–400)
RBC: 4.4 MIL/uL (ref 4.22–5.81)
RDW: 13.4 % (ref 11.5–15.5)
WBC: 8.7 10*3/uL (ref 4.0–10.5)

## 2015-08-25 LAB — COMPREHENSIVE METABOLIC PANEL
ALK PHOS: 70 U/L (ref 38–126)
ALT: 21 U/L (ref 17–63)
ANION GAP: 6 (ref 5–15)
AST: 23 U/L (ref 15–41)
Albumin: 3.9 g/dL (ref 3.5–5.0)
BILIRUBIN TOTAL: 0.7 mg/dL (ref 0.3–1.2)
BUN: 16 mg/dL (ref 6–20)
CALCIUM: 8.8 mg/dL — AB (ref 8.9–10.3)
CO2: 28 mmol/L (ref 22–32)
Chloride: 105 mmol/L (ref 101–111)
Creatinine, Ser: 1.59 mg/dL — ABNORMAL HIGH (ref 0.61–1.24)
GFR calc non Af Amer: 47 mL/min — ABNORMAL LOW (ref >60)
GFR, EST AFRICAN AMERICAN: 54 mL/min — AB (ref >60)
Glucose, Bld: 129 mg/dL — ABNORMAL HIGH (ref 65–99)
Potassium: 3.6 mmol/L (ref 3.5–5.1)
Sodium: 139 mmol/L (ref 135–145)
TOTAL PROTEIN: 7.8 g/dL (ref 6.5–8.1)

## 2015-08-25 LAB — LIPASE, BLOOD: Lipase: 16 U/L (ref 11–51)

## 2015-08-25 NOTE — Discharge Instructions (Signed)
Get miralax and put one dose or 17 g in 8 ounces of water,  take 1 dose every 30 minutes for 2-3 hours or until you  get good results and then once or twice daily to prevent constipation.    Constipation, Adult Constipation is when a person:  Poops (has a bowel movement) less than 3 times a week.  Has a hard time pooping.  Has poop that is dry, hard, or bigger than normal. HOME CARE   Eat foods with a lot of fiber in them. This includes fruits, vegetables, beans, and whole grains such as brown rice.  Avoid fatty foods and foods with a lot of sugar. This includes french fries, hamburgers, cookies, candy, and soda.  If you are not getting enough fiber from food, take products with added fiber in them (supplements).  Drink enough fluid to keep your pee (urine) clear or pale yellow.  Exercise on a regular basis, or as told by your doctor.  Go to the restroom when you feel like you need to poop. Do not hold it.  Only take medicine as told by your doctor. Do not take medicines that help you poop (laxatives) without talking to your doctor first. GET HELP RIGHT AWAY IF:   You have bright red blood in your poop (stool).  Your constipation lasts more than 4 days or gets worse.  You have belly (abdominal) or butt (rectal) pain.  You have thin poop (as thin as a pencil).  You lose weight, and it cannot be explained. MAKE SURE YOU:   Understand these instructions.  Will watch your condition.  Will get help right away if you are not doing well or get worse.   This information is not intended to replace advice given to you by your health care provider. Make sure you discuss any questions you have with your health care provider.   Document Released: 08/10/2007 Document Revised: 03/14/2014 Document Reviewed: 12/03/2012 Elsevier Interactive Patient Education [employer].

## 2015-08-25 NOTE — ED Notes (Signed)
Pt c/o abd pain and last known bowel movement was Friday.

## 2015-08-25 NOTE — ED Provider Notes (Signed)
CSN: [id number]     Arrival date & time 08/25/15  2008 History  By signing my name below, I, Frank Levine, attest that this documentation has been prepared under the direction and in the presence of Frank Porter, MD at 23:02.   Electronically Signed: Nicole Levine, ED Scribe. 08/25/2015. 11:32 PM   Chief Complaint  Patient presents with  . Abdominal Pain    The history is provided by a caregiver and a relative. The history is limited by a developmental delay. No language interpreter was used.  .The history is provided by a caregiver and a relative. The history is limited by a developmental delay. No language interpreter was used.   Level 5 Caveat: Mental Retardation  HPI Comments: Frank Levine is a 55 y.o. male with extensive PMHx including Down's Syndrome who presents to the Emergency Department complaining of gradual onset, abdominal pain, ongoing for a few days. Pt reports associated constipation and one episode of nausea today. Pt passes hard, round balls when he has bowel movements.  Last week he started having frequency and dysuria and had a UA done at his PCP office. He finished his ciprofloxacin prescription today. Pt also had his suprapubic catheter changed today at his Urologists' office. His Urologist has told his mother to not test his urine any more because it always has blood in it and to only treat for UTI if he gets a fever. Pt also complains of right hip pain. No other associated symptoms noted. Pt has tried one dose of miralax today with minimal relief to symptoms. He takes a stool softener daily for chronic constipation.  No other worsening or alleviating factors noted. Pt denies emesis, fever, chills, or any other pertinent symptoms. Hx obtained from his mother.  PCP is Dr. Vonna Kotyk Levine.  Urology Dr Frank Levine  Past Medical History  Diagnosis Date  . Hyperlipemia   . Down's syndrome 09/27/2011  . History of DVT of lower extremity     POST GU SURGERY  2013  &  2011   . History of pulmonary embolus (PE)     DEC 2011  POST GU SURG[place] --  [place]  FOR 6 MONTHS  . Chronic cystitis   . Neurogenic bladder   . Arthritis   . Mental retardation   . Anxiety   . Wears glasses   . GERD (gastroesophageal reflux disease)     OCCASIONAL  . BPH (benign prostatic hypertrophy)   . History of Meckel's diverticulum     BLADDER  . Chronic urethral stricture     urologist-  dr Frank Levine  . CKD (chronic kidney disease), stage II   . Bilateral hydronephrosis    Past Surgical History  Procedure Laterality Date  . Cysto/  balloon dilattion urethral stricture/ bilateral retrograde pyelogram/ bilateral ureteral stents/ bladder bx/  left ureteroscopy  01-27-2010  . Cysto/  balloon dilation urethral stricture/  removal & replacement right ureteral stetn/  right retrograde pyelogram/   left ureteroscopy/  attempted removal left stent  05-10-2010  . Cysto/  balloon dilation urethral stricture/  ureteroscopy/ removal left stent  10-17-2010  . Cystoscopy with urethral dilatation N/A 07/10/2013    Procedure: CYSTOSCOPY WITH URETHRAL DILATATION, BALLOON DILITATION, RIGHT  RETROGRADE;  Surgeon: Bernestine Amass, MD;  Location: [facility];  Service: Urology;  Laterality: N/A;  . Cystogram N/A 07/10/2013    Procedure: CYSTOGRAM;  Surgeon: Bernestine Amass, MD;  Location: [facility];  Service: Urology;  Laterality: N/A;  .  Cystoscopy with urethral dilatation N/A 07/02/2014    Procedure: CYSTOSCOPY WITH URETHRAL DILATATION;  Surgeon: Rana Snare, MD;  Location[facility];  Service: Urology;  Laterality: N/A;  . Cystogram N/A 07/02/2014    Procedure: CYSTOGRAM;  Surgeon: Rana Snare, MD;  Location: [facility];  Service: Urology;  Laterality: N/A;  . Cystoscopy w/ retrogrades Right 06/03/2015    Procedure: CYSTOSCOPY WITH RIGHT RETROGRADE PYELOGRAM CYSTOGRAM;  Surgeon: Rana Snare, MD;  Location: [facility];  Service:  Urology;  Laterality: Right;  . Insertion of suprapubic catheter N/A 06/03/2015    Procedure: INSERTION OF SUPRAPUBIC CATHETER;  Surgeon: Rana Snare, MD;  Location: [facility];  Service: Urology;  Laterality: N/A;  . Balloon dilation N/A 06/03/2015    Procedure: BALLOON DILATION;  Surgeon: Rana Snare, MD;  Location: [facility];  Service: Urology;  Laterality: N/A;   Family History  Problem Relation Age of Onset  . Hypertension Mother   . Hyperlipidemia Mother   . Cancer Father    Social History  Substance Use Topics  . Smoking status: Never Smoker   . Smokeless tobacco: Never Used  . Alcohol Use: No  lives with mother  Review of Systems  Constitutional: Negative for fever and chills.  Gastrointestinal: Positive for nausea, abdominal pain and constipation. Negative for vomiting and diarrhea.  Musculoskeletal: Positive for arthralgias.       Right hip pain.  All other systems reviewed and are negative.     Allergies  Review of patient's allergies indicates no known allergies.  Home Medications   Prior to Admission medications   Medication Sig Start Date End Date Taking? Authorizing Provider  aspirin EC 81 MG tablet Take 81 mg by mouth every morning.     Historical Provider, MD  ciprofloxacin (CIPRO) 500 MG tablet Take 1 tablet (500 mg total) by mouth 2 (two) times daily. 08/14/15   Fransisca Kaufmann Dettinger, MD  docusate sodium (COLACE) 100 MG capsule Take 100 mg by mouth daily as needed.     Historical Provider, MD  econazole nitrate 1 % cream Apply 1 application topically 2 (two) times daily.  02/02/13   Historical Provider, MD  pantoprazole (PROTONIX) 40 MG tablet Take 1 tablet (40 mg total) by mouth daily. Patient taking differently: Take 40 mg by mouth every morning.  10/02/14   Sharion Balloon, FNP  simvastatin (ZOCOR) 40 MG tablet TAKE 1 TABLET (40 MG TOTAL) BY MOUTH DAILY AT 6 PM. 05/19/15   Fransisca Kaufmann Dettinger, MD  sulfamethoxazole-trimethoprim  (BACTRIM DS,SEPTRA DS) 800-160 MG tablet Take 1 tablet by mouth every evening.    Historical Provider, MD  tamsulosin (FLOMAX) 0.4 MG CAPS capsule TAKE 1 CAPSULE (0.4 MG TOTAL) BY MOUTH DAILY. Patient taking differently: TAKE 1 CAPSULE (0.4 MG TOTAL) BY MOUTH DAILY.---  takes in am 05/19/15   Fransisca Kaufmann Dettinger, MD   BP 129/73 mmHg  Pulse 111  Temp(Src) 98.7 F (37.1 C) (Oral)  Resp 20  Wt 183 lb 4 oz (83.122 kg)  SpO2 100%  Vital signs normal except for tachycardia  Physical Exam  Constitutional: He is oriented to person, place, and time. He appears well-developed and well-nourished.  Non-toxic appearance. He does not appear ill. No distress.  Typical downs facies and body habitus.  HENT:  Head: Normocephalic and atraumatic.  Right Ear: External ear normal.  Left Ear: External ear normal.  Nose: Nose normal. No mucosal edema or rhinorrhea.  Mouth/Throat: Oropharynx  is clear and moist and mucous membranes are normal. No dental abscesses or uvula swelling.  Eyes: Conjunctivae and EOM are normal. Pupils are equal, round, and reactive to light.  Neck: Normal range of motion and full passive range of motion without pain. Neck supple.  Cardiovascular: Normal rate, regular rhythm and normal heart sounds.  Exam reveals no gallop and no friction rub.   No murmur heard. Pulmonary/Chest: Effort normal and breath sounds normal. No respiratory distress. He has no wheezes. He has no rhonchi. He has no rales. He exhibits no tenderness and no crepitus.  Abdominal: Soft. Normal appearance and bowel sounds are normal. He exhibits no distension. There is no tenderness. There is no rebound and no guarding.  Musculoskeletal: Normal range of motion. He exhibits no edema or tenderness.  Moves all extremities well. No pain in right hip with ROM of hip.  Neurological: He is alert and oriented to person, place, and time. He has normal strength. No cranial nerve deficit.  Skin: Skin is warm, dry and intact. No  rash noted. No erythema. No pallor.  Psychiatric: He has a normal mood and affect. His speech is normal and behavior is normal. His mood appears not anxious.  Nursing note and vitals reviewed.   ED Course  Procedures (including critical care time) DIAGNOSTIC STUDIES: Oxygen Saturation is 100% on RA, normal by my interpretation.    COORDINATION OF CARE: 11:13 PM Discussed treatment plan which includes DG abdomen acute with chest, lipase, CMP, CBC, and urinalysis with pt at bedside and pt agreed to plan.  I discussed patient's x-ray results and his CBC. We were waiting for the rest of his blood work to return. We discussed treating him with a more accelerated course of MiraLAX for his constipation.  20 3:30 PM patient's laboratory tests have resulted. There is no acute changes. Patient was discharged home to take MiraLAX as instructed. His urinalysis had not been obtained yet and it was canceled, after consideration of what the urologist told his mother today i.e. not to test his urine unless he has a fever.   Labs Review Results for orders placed or performed during the hospital encounter of 08/25/15  Lipase, blood  Result Value Ref Range   Lipase 16 11 - 51 U/L  Comprehensive metabolic panel  Result Value Ref Range   Sodium 139 135 - 145 mmol/L   Potassium 3.6 3.5 - 5.1 mmol/L   Chloride 105 101 - 111 mmol/L   CO2 28 22 - 32 mmol/L   Glucose, Bld 129 (H) 65 - 99 mg/dL   BUN 16 6 - 20 mg/dL   Creatinine, Ser 1.59 (H) 0.61 - 1.24 mg/dL   Calcium 8.8 (L) 8.9 - 10.3 mg/dL   Total Protein 7.8 6.5 - 8.1 g/dL   Albumin 3.9 3.5 - 5.0 g/dL   AST 23 15 - 41 U/L   ALT 21 17 - 63 U/L   Alkaline Phosphatase 70 38 - 126 U/L   Total Bilirubin 0.7 0.3 - 1.2 mg/dL   GFR calc non Af Amer 47 (L) >60 mL/min   GFR calc Af Amer 54 (L) >60 mL/min   Anion gap 6 5 - 15  CBC  Result Value Ref Range   WBC 8.7 4.0 - 10.5 K/uL   RBC 4.40 4.22 - 5.81 MIL/uL   Hemoglobin 15.1 13.0 - 17.0 g/dL   HCT  44.0 39.0 - 52.0 %   MCV 100.0 78.0 - 100.0 fL   MCH 34.3 (  H) 26.0 - 34.0 pg   MCHC 34.3 30.0 - 36.0 g/dL   RDW 13.4 11.5 - 15.5 %   Platelets 261 150 - 400 K/uL    Laboratory interpretation all normal except stable renal insufficiency    Imaging Review Dg Abd Acute W/chest  08/25/2015  CLINICAL DATA:  Lower abdominal pain.  No time course given. EXAM: DG ABDOMEN ACUTE W/ 1V CHEST COMPARISON:  None. FINDINGS: The cardiac silhouette, mediastinal and hilar contours are normal. The lungs are clear. No pleural effusion. Two views of the abdomen demonstrate moderate stool throughout the colon and down into the rectum suggesting constipation. No distended small bowel loops to suggest obstruction. No free air. The soft tissue shadows are grossly maintained. The bony structures are intact. IMPRESSION: No acute cardiopulmonary findings. Moderate stool throughout the colon and down into the rectum suggesting constipation. No findings for obstruction or free air. Electronically Signed   By: Marijo Sanes M.D.   On: 08/25/2015 20:45   I have personally reviewed and evaluated these images and lab results as part of my medical decision-making.    MDM   Final diagnoses:  Constipation, unspecified constipation type    Discharge meds miralax  Plan discharge  I personally performed the services described in this documentation, which was scribed in my presence. The recorded information has been reviewed and considered.  Frank Porter, MD, Barbette Or, MD 08/25/15 [phone]

## 2015-10-01 ENCOUNTER — Other Ambulatory Visit: Payer: Medicare Other

## 2015-10-01 DIAGNOSIS — N182 Chronic kidney disease, stage 2 (mild): Secondary | ICD-10-CM | POA: Diagnosis not present

## 2015-10-01 DIAGNOSIS — E785 Hyperlipidemia, unspecified: Secondary | ICD-10-CM

## 2015-10-02 LAB — BMP8+EGFR
BUN / CREAT RATIO: 9 (ref 9–20)
BUN: 14 mg/dL (ref 6–24)
CO2: 28 mmol/L (ref 18–29)
CREATININE: 1.62 mg/dL — AB (ref 0.76–1.27)
Calcium: 8.8 mg/dL (ref 8.7–10.2)
Chloride: 102 mmol/L (ref 96–106)
GFR, EST AFRICAN AMERICAN: 54 mL/min/{1.73_m2} — AB (ref >59)
GFR, EST NON AFRICAN AMERICAN: 47 mL/min/{1.73_m2} — AB (ref >59)
Glucose: 97 mg/dL (ref 65–99)
Potassium: 5.1 mmol/L (ref 3.5–5.2)
SODIUM: 143 mmol/L (ref 134–144)

## 2015-10-02 LAB — LIPID PANEL
CHOLESTEROL TOTAL: 153 mg/dL (ref 100–199)
Chol/HDL Ratio: 2.9 ratio units (ref 0.0–5.0)
HDL: 53 mg/dL (ref >39)
LDL CALC: 85 mg/dL (ref 0–99)
TRIGLYCERIDES: 75 mg/dL (ref 0–149)
VLDL Cholesterol Cal: 15 mg/dL (ref 5–40)

## 2015-10-06 DIAGNOSIS — R339 Retention of urine, unspecified: Secondary | ICD-10-CM | POA: Diagnosis not present

## 2015-10-20 DIAGNOSIS — B351 Tinea unguium: Secondary | ICD-10-CM | POA: Diagnosis not present

## 2015-10-20 DIAGNOSIS — M79676 Pain in unspecified toe(s): Secondary | ICD-10-CM | POA: Diagnosis not present

## 2015-10-23 ENCOUNTER — Other Ambulatory Visit: Payer: Self-pay | Admitting: *Deleted

## 2015-10-23 DIAGNOSIS — K219 Gastro-esophageal reflux disease without esophagitis: Secondary | ICD-10-CM

## 2015-10-23 MED ORDER — TAMSULOSIN HCL 0.4 MG PO CAPS: ORAL_CAPSULE | ORAL | 1 refills | Status: DC

## 2015-10-23 MED ORDER — SIMVASTATIN 40 MG PO TABS: ORAL_TABLET | ORAL | 1 refills | Status: DC

## 2015-10-23 MED ORDER — PANTOPRAZOLE SODIUM 40 MG PO TBEC: 40.0000 mg | DELAYED_RELEASE_TABLET | Freq: Every morning | ORAL | 0 refills | Status: DC

## 2015-10-28 ENCOUNTER — Ambulatory Visit (INDEPENDENT_AMBULATORY_CARE_PROVIDER_SITE_OTHER): Payer: Medicare Other | Admitting: Family Medicine

## 2015-10-28 ENCOUNTER — Encounter: Payer: Self-pay | Admitting: Family Medicine

## 2015-10-28 VITALS — BP 106/71 | HR 99 | Temp 98.3°F | Ht 62.0 in | Wt 176.4 lb

## 2015-10-28 DIAGNOSIS — E785 Hyperlipidemia, unspecified: Secondary | ICD-10-CM | POA: Diagnosis not present

## 2015-10-28 DIAGNOSIS — Z1159 Encounter for screening for other viral diseases: Secondary | ICD-10-CM | POA: Diagnosis not present

## 2015-10-28 NOTE — Progress Notes (Signed)
BP 106/71 (BP Location: Right Arm, Patient Position: Sitting, Cuff Size: Normal)   Pulse 99   Temp 98.3 F (36.8 C) (Oral)   Ht 5' 2" (1.575 m)   Wt 176 lb 6.4 oz (80 kg)   BMI 32.26 kg/m    Subjective:    Patient ID: Frank Levine, male    DOB: [birth year], 55 y.o.   MRN: [id number]  HPI: Frank Levine is a 55 y.o. male presenting on 10/28/2015 for Hyperlipidemia (6 month followup, labs were performed in July)   HPI Hyperlipidemia Patient comes in today for cholesterol recheck. He is currently on simvastatin and his last cholesterol check 1 month ago was very controlled. We will recheck in 6 months prior to her next visit. Patient denies headaches, blurred vision, chest pains, shortness of breath, or weakness. Denies any side effects from medication and is content with current medication.   Relevant past medical, surgical, family and social history reviewed and updated as indicated. Interim medical history since our last visit reviewed. Allergies and medications reviewed and updated.  Review of Systems  Constitutional: Negative for fever.  HENT: Negative for ear discharge and ear pain.   Eyes: Negative for discharge and visual disturbance.  Respiratory: Negative for shortness of breath and wheezing.   Cardiovascular: Negative for chest pain and leg swelling.  Gastrointestinal: Negative for abdominal pain, constipation and diarrhea.  Genitourinary: Negative for difficulty urinating.  Musculoskeletal: Negative for back pain and gait problem.  Skin: Negative for rash.  Neurological: Negative for syncope, light-headedness and headaches.  All other systems reviewed and are negative.   Per HPI unless specifically indicated above     Medication List       Accurate as of 10/28/15  9:15 AM. Always use your most recent med list.          aspirin EC 81 MG tablet Take 81 mg by mouth every morning.   docusate sodium 100 MG capsule Commonly known as:  COLACE Take 100 mg by  mouth daily as needed.   econazole nitrate 1 % cream Apply 1 application topically 2 (two) times daily.   pantoprazole 40 MG tablet Commonly known as:  PROTONIX Take 1 tablet (40 mg total) by mouth every morning.   simvastatin 40 MG tablet Commonly known as:  ZOCOR TAKE 1 TABLET (40 MG TOTAL) BY MOUTH DAILY AT 6 PM.   tamsulosin 0.4 MG Caps capsule Commonly known as:  FLOMAX TAKE 1 CAPSULE (0.4 MG TOTAL) BY MOUTH DAILY.---  takes in am          Objective:    BP 106/71 (BP Location: Right Arm, Patient Position: Sitting, Cuff Size: Normal)   Pulse 99   Temp 98.3 F (36.8 C) (Oral)   Ht 5' 2" (1.575 m)   Wt 176 lb 6.4 oz (80 kg)   BMI 32.26 kg/m   Wt Readings from Last 3 Encounters:  10/28/15 176 lb 6.4 oz (80 kg)  08/25/15 183 lb 4 oz (83.1 kg)  06/03/15 185 lb 8 oz (84.1 kg)    Physical Exam  Constitutional: He is oriented to person, place, and time. He appears well-developed and well-nourished. No distress.  Eyes: Conjunctivae and EOM are normal. Pupils are equal, round, and reactive to light. Right eye exhibits no discharge. No scleral icterus.  Neck: Neck supple. No thyromegaly present.  Cardiovascular: Normal rate, regular rhythm, normal heart sounds and intact distal pulses.   No murmur heard. Pulmonary/Chest: Effort normal and breath  sounds normal. No respiratory distress. He has no wheezes.  Musculoskeletal: Normal range of motion. He exhibits no edema.  Lymphadenopathy:    He has no cervical adenopathy.  Neurological: He is alert and oriented to person, place, and time. Coordination normal.  Skin: Skin is warm and dry. No rash noted. He is not diaphoretic.  Psychiatric: He has a normal mood and affect. His behavior is normal.  Nursing note and vitals reviewed.   Results for orders placed or performed in visit on 10/01/15  [facility]  Result Value Ref Range   Glucose 97 65 - 99 mg/dL   BUN 14 6 - 24 mg/dL   Creatinine, Ser 1.62 (H) 0.76 - 1.27 mg/dL    GFR calc non Af Amer 47 (L) >59 mL/min/1.73   GFR calc Af Amer 54 (L) >59 mL/min/1.73   BUN/Creatinine Ratio 9 9 - 20   Sodium 143 134 - 144 mmol/L   Potassium 5.1 3.5 - 5.2 mmol/L   Chloride 102 96 - 106 mmol/L   CO2 28 18 - 29 mmol/L   Calcium 8.8 8.7 - 10.2 mg/dL  Lipid panel  Result Value Ref Range   Cholesterol, Total 153 100 - 199 mg/dL   Triglycerides 75 0 - 149 mg/dL   HDL 53 >39 mg/dL   VLDL Cholesterol Cal 15 5 - 40 mg/dL   LDL Calculated 85 0 - 99 mg/dL   Chol/HDL Ratio 2.9 0.0 - 5.0 ratio units      Assessment & Plan:   Problem List Items Addressed This Visit      Other   Hyperlipidemia - Primary   Relevant Orders   CMP14+EGFR   Lipid panel   TSH    Other Visit Diagnoses    Need for hepatitis C screening test       Relevant Orders   Hepatitis C antibody       Follow up plan: Return in about 6 months (around 04/29/2016), or if symptoms worsen or fail to improve, for Cholesterol and renal recheck in 6 months.  Counseling provided for all of the vaccine components Orders Placed This Encounter  Procedures  . CMP14+EGFR  . Lipid panel  . TSH    Caryl Pina, MD [facility] 10/28/2015, 9:15 AM

## 2015-11-17 DIAGNOSIS — R339 Retention of urine, unspecified: Secondary | ICD-10-CM | POA: Diagnosis not present

## 2015-11-17 DIAGNOSIS — N359 Urethral stricture, unspecified: Secondary | ICD-10-CM | POA: Diagnosis not present

## 2015-11-19 DIAGNOSIS — S0502XA Injury of conjunctiva and corneal abrasion without foreign body, left eye, initial encounter: Secondary | ICD-10-CM | POA: Diagnosis not present

## 2015-11-19 DIAGNOSIS — H578 Other specified disorders of eye and adnexa: Secondary | ICD-10-CM | POA: Diagnosis not present

## 2015-11-20 DIAGNOSIS — H578 Other specified disorders of eye and adnexa: Secondary | ICD-10-CM | POA: Diagnosis not present

## 2015-11-20 DIAGNOSIS — S0502XA Injury of conjunctiva and corneal abrasion without foreign body, left eye, initial encounter: Secondary | ICD-10-CM | POA: Diagnosis not present

## 2015-12-01 ENCOUNTER — Encounter: Payer: Self-pay | Admitting: Family Medicine

## 2015-12-01 ENCOUNTER — Ambulatory Visit (HOSPITAL_COMMUNITY)
Admission: RE | Admit: 2015-12-01 | Discharge: 2015-12-01 | Disposition: A | Discharge status: Home / Self Care | Payer: Medicare Other | Source: Ambulatory Visit | Attending: Family Medicine | Admitting: Family Medicine

## 2015-12-01 ENCOUNTER — Telehealth: Payer: Self-pay | Admitting: Family Medicine

## 2015-12-01 ENCOUNTER — Ambulatory Visit (INDEPENDENT_AMBULATORY_CARE_PROVIDER_SITE_OTHER): Payer: Medicare Other | Admitting: Family Medicine

## 2015-12-01 ENCOUNTER — Ambulatory Visit (INDEPENDENT_AMBULATORY_CARE_PROVIDER_SITE_OTHER): Payer: Medicare Other

## 2015-12-01 ENCOUNTER — Other Ambulatory Visit: Payer: Self-pay | Admitting: Family Medicine

## 2015-12-01 VITALS — BP 94/60 | HR 99 | Temp 98.3°F | Ht 62.0 in | Wt 173.0 lb

## 2015-12-01 DIAGNOSIS — M25561 Pain in right knee: Secondary | ICD-10-CM | POA: Diagnosis not present

## 2015-12-01 DIAGNOSIS — M7989 Other specified soft tissue disorders: Secondary | ICD-10-CM | POA: Diagnosis not present

## 2015-12-01 DIAGNOSIS — M25471 Effusion, right ankle: Secondary | ICD-10-CM | POA: Diagnosis not present

## 2015-12-01 NOTE — Progress Notes (Signed)
Subjective:  Patient ID: Frank Levine, male    DOB: December 17, 1960  Age: 55 y.o. MRN: [id number]  CC: Leg Pain (pt here today c/o right lower leg and ankle pain since yesterday and hasn't fallen or had any recent injury)   HPI Frank Levine presents for Right lower leg extremity. It goes all the way from the knee to the ankle. Onset when he woke up yesterday morning. He gone to bed the night before without any discomfort. He has a history of DVT on the left. He is not currently taking any anticoagulant. When he woke up yesterday morning he felt some swelling and pain through the day it got worse to the point where he had to use a cane. And now is favoring it so that he needs a wheelchair. There is no fever no chills no sweats. No change in appetite. Activity change due to favoring the leg.   History Daelin has a past medical history of Anxiety; Arthritis; Bilateral hydronephrosis; BPH (benign prostatic hypertrophy); Chronic cystitis; Chronic urethral stricture; CKD (chronic kidney disease), stage II; Down's syndrome (09/27/2011); GERD (gastroesophageal reflux disease); History of DVT of lower extremity; History of Meckel's diverticulum; History of pulmonary embolus (PE); Hyperlipemia; Mental retardation; Neurogenic bladder; and Wears glasses.   He has a past surgical history that includes CYSTO/  BALLOON DILATTION URETHRAL STRICTURE/ BILATERAL RETROGRADE PYELOGRAM/ BILATERAL URETERAL STENTS/ BLADDER BX/  LEFT URETEROSCOPY (01-27-2010); CYSTO/  BALLOON DILATION URETHRAL STRICTURE/  REMOVAL & REPLACEMENT RIGHT URETERAL STETN/  RIGHT RETROGRADE PYELOGRAM/   LEFT URETEROSCOPY/  ATTEMPTED REMOVAL LEFT STENT (05-10-2010); CYSTO/  BALLOON DILATION URETHRAL STRICTURE/  URETEROSCOPY/ REMOVAL LEFT STENT (10-17-2010); Cystoscopy with urethral dilatation (N/A, 07/10/2013); Cystogram (N/A, 07/10/2013); Cystoscopy with urethral dilatation (N/A, 07/02/2014); Cystogram (N/A, 07/02/2014); Cystoscopy w/ retrogrades (Right,  06/03/2015); Insertion of suprapubic catheter (N/A, 06/03/2015); and Balloon dilation (N/A, 06/03/2015).   His family history includes Cancer in his father; Hyperlipidemia in his mother; Hypertension in his mother.He reports that he has never smoked. He has never used smokeless tobacco. He reports that he does not drink alcohol or use drugs.    ROS Review of Systems  Constitutional: Negative for chills, diaphoresis and fever.  HENT: Negative for rhinorrhea and sore throat.   Respiratory: Negative for cough and shortness of breath.   Cardiovascular: Negative for chest pain.  Gastrointestinal: Negative for abdominal pain.  Musculoskeletal: Negative for arthralgias and myalgias.  Skin: Negative for rash.  Neurological: Negative for weakness and headaches.    Objective:  BP 94/60   Pulse 99   Temp 98.3 F (36.8 C) (Oral)   Ht 5\' 2"  (1.575 m)   Wt 173 lb (78.5 kg)   BMI 31.64 kg/m   BP Readings from Last 3 Encounters:  12/01/15 94/60  10/28/15 106/71  08/25/15 136/89    Wt Readings from Last 3 Encounters:  12/01/15 173 lb (78.5 kg)  10/28/15 176 lb 6.4 oz (80 kg)  08/25/15 183 lb 4 oz (83.1 kg)     Physical Exam  Constitutional: He is oriented to person, place, and time. He appears well-developed and well-nourished.  HENT:  Head: Normocephalic and atraumatic.  Right Ear: Tympanic membrane and external ear normal. No decreased hearing is noted.  Left Ear: Tympanic membrane and external ear normal. No decreased hearing is noted.  Mouth/Throat: No oropharyngeal exudate or posterior oropharyngeal erythema.  Eyes: Pupils are equal, round, and reactive to light.  Neck: Normal range of motion. Neck supple.  Cardiovascular: Normal rate and regular  rhythm.   No murmur heard. Pulmonary/Chest: Breath sounds normal. No respiratory distress.  Abdominal: Soft. Bowel sounds are normal. He exhibits no mass. There is no tenderness.  Musculoskeletal: He exhibits edema (2+ pitting at the  ankle and distal leg on the right). He exhibits no deformity.  The right knee has full range of motion without discomfort actively and passively. Gait is normal without a limp. The joint lines are nontender. The patella is palpable without tenderness or edema.  Lachman / anterior drawer signs are negative for signs of instability and pain free. McMurray testing reveals no pop or excessive discomfort. Varus and valgus stree maneuvers do not cause ligamentous stretch or instability.   Neurological: He is alert and oriented to person, place, and time.  Vitals reviewed.    Lab Results  Component Value Date   WBC 8.7 08/25/2015   HGB 15.1 08/25/2015   HCT 44.0 08/25/2015   PLT 261 08/25/2015   GLUCOSE 97 10/01/2015   CHOL 153 10/01/2015   TRIG 75 10/01/2015   HDL 53 10/01/2015   LDLCALC 85 10/01/2015   ALT 21 08/25/2015   AST 23 08/25/2015   NA 143 10/01/2015   K 5.1 10/01/2015   CL 102 10/01/2015   CREATININE 1.62 (H) 10/01/2015   BUN 14 10/01/2015   CO2 28 10/01/2015   TSH 2.810 03/19/2013   PSA 0.5 03/19/2013   INR 1.24 08/01/2010    Dg Abd Acute W/chest  Result Date: 08/25/2015 CLINICAL DATA:  Lower abdominal pain.  No time course given. EXAM: DG ABDOMEN ACUTE W/ 1V CHEST COMPARISON:  None. FINDINGS: The cardiac silhouette, mediastinal and hilar contours are normal. The lungs are clear. No pleural effusion. Two views of the abdomen demonstrate moderate stool throughout the colon and down into the rectum suggesting constipation. No distended small bowel loops to suggest obstruction. No free air. The soft tissue shadows are grossly maintained. The bony structures are intact. IMPRESSION: No acute cardiopulmonary findings. Moderate stool throughout the colon and down into the rectum suggesting constipation. No findings for obstruction or free air. Electronically Signed   By: Marijo Sanes M.D.   On: 08/25/2015 20:45    Assessment & Plan:   Cung was seen today for leg  pain.  Diagnoses and all orders for this visit:  Arthralgia of right lower leg -     DG Knee 1-2 Views Right; Future -     DG Ankle Complete Right; Future -     Ultrasound doppler venous legs bilat; Future  BAsed on hx of PE, DVT, will have Doppler done today. XR - some post traumatic arthritis changes at talo- tibial joint. Nothing acute noted.     Follow-up: Return if symptoms worsen or fail to improve.  Claretta Fraise, M.D.

## 2015-12-03 ENCOUNTER — Encounter: Payer: Self-pay | Admitting: Family Medicine

## 2015-12-03 ENCOUNTER — Ambulatory Visit (INDEPENDENT_AMBULATORY_CARE_PROVIDER_SITE_OTHER): Payer: Medicare Other | Admitting: Family Medicine

## 2015-12-03 VITALS — BP 109/68 | HR 114 | Temp 98.3°F | Ht 62.0 in | Wt 171.6 lb

## 2015-12-03 DIAGNOSIS — M25561 Pain in right knee: Secondary | ICD-10-CM | POA: Diagnosis not present

## 2015-12-03 MED ORDER — PREDNISONE 20 MG PO TABS: 40.0000 mg | ORAL_TABLET | Freq: Every day | ORAL | 0 refills | Status: DC

## 2015-12-03 NOTE — Patient Instructions (Signed)
Great to see you!  Try the prednisone 2 pills once daily for 5 days, also try ice 10-15 minutes 3-4 times a day  Come back if he gets worse or he does not get better as expected.

## 2015-12-03 NOTE — Progress Notes (Signed)
   HPI  Patient presents today here with persistent right knee pain.  Patient explains that over the last few days he's had persistent right knee pain. He was seen 3 days ago for right knee and foot pain. Swelling of the lower extremity and has a history of DVT. X-rays were performed showing right ankle arthritis, very mild early arthritic changes of the right knee, and an ultrasound ruled out DVT.  He continues to have pain, slight swelling of posterior knee. His mother explains that he's been moaning every once in a while.  They've given him Tylenol but no other medications.  No injury that they know of.  He has Down syndrome, they state this is very uncharacteristic for him.  PMH: Smoking status noted ROS: Per HPI  Objective: BP 109/68   Pulse (!) 114   Temp 98.3 F (36.8 C) (Oral)   Ht 5\' 2"  (1.575 m)   Wt 171 lb 9.6 oz (77.8 kg)   BMI 31.39 kg/m  Gen: NAD, alert, cooperative with exam HEENT: NCAT, EOMI, PERRL CV: RRR, good S1/S2, no murmur Resp: CTABL, no wheezes, non-labored Abd: SNTND, BS present, no guarding or organomegaly Ext: No edema, warm Neuro: Alert and oriented, No gross deficits  MSK: R knee without erythema, bruising, or gross deformity Slightly warm compared to the left knee, small amount of swelling behind the right knee No joint line tenderness.  ligamentously intact to Lachman's and with varus and valgus stress.  Negative McMurray's test   Assessment and plan:  # Right knee pain Given that it's slightly warm, and it was associated with right knee and ankle pain I have considered gout as a possible etiology, also consider pseudogout given that it's predominantly knee. His pain appears to be mostly knee currently. His x-ray shows osteoarthritis of the right ankle, very mild OA changes in the right knee, DVT has been ruled out. Given mild chronic kidney disease I have avoided NSAIDs, given 5 days of prednisone Return to clinic with any concerns or  failure to improve as expected.    Meds ordered this encounter  Medications  . predniSONE (DELTASONE) 20 MG tablet    Sig: Take 2 tablets (40 mg total) by mouth daily with breakfast.    Dispense:  10 tablet    Refill:  0    Laroy Apple, MD [facility] 12/03/2015, 11:42 AM

## 2015-12-04 ENCOUNTER — Other Ambulatory Visit: Payer: Self-pay | Admitting: Family

## 2015-12-04 DIAGNOSIS — K219 Gastro-esophageal reflux disease without esophagitis: Secondary | ICD-10-CM

## 2015-12-10 NOTE — Telephone Encounter (Signed)
Patient mother aware of results

## 2015-12-16 ENCOUNTER — Other Ambulatory Visit: Payer: Self-pay | Admitting: Family

## 2015-12-17 ENCOUNTER — Ambulatory Visit (INDEPENDENT_AMBULATORY_CARE_PROVIDER_SITE_OTHER): Payer: Medicare Other | Admitting: Family Medicine

## 2015-12-17 ENCOUNTER — Encounter: Payer: Self-pay | Admitting: Family Medicine

## 2015-12-17 VITALS — BP 108/67 | HR 81 | Temp 98.0°F | Ht 62.0 in | Wt 172.0 lb

## 2015-12-17 DIAGNOSIS — Z23 Encounter for immunization: Secondary | ICD-10-CM | POA: Diagnosis not present

## 2015-12-17 DIAGNOSIS — L719 Rosacea, unspecified: Secondary | ICD-10-CM | POA: Diagnosis not present

## 2015-12-17 MED ORDER — METRONIDAZOLE 1 % EX GEL: Freq: Every day | CUTANEOUS | 0 refills | Status: DC

## 2015-12-17 NOTE — Progress Notes (Signed)
BP 108/67 (BP Location: Right Arm, Patient Position: Sitting, Cuff Size: Normal)   Pulse 81   Temp 98 F (36.7 C) (Oral)   Ht 5\' 2"  (1.575 m)   Wt 172 lb (78 kg)   BMI 31.46 kg/m    Subjective:    Patient ID: Frank Levine, male    DOB: 05/19/1960, 55 y.o.   MRN: [id number]  HPI: Frank Levine is a 55 y.o. male presenting on 12/17/2015 for Rash (on nose and forehead x 2 days. Has applied aquaphor. Can't attribute it to anything topical.)   HPI Rash on nose and forehead Patient has a bright red rash on the upper part of his nose and a little on his forehead but then under his cheeks under his eyes on both sides. He denies any fevers or chills or drainage from the site or any pruritus or pain from it. These of been going on off and on for the past month. He denies having this previously. There has not been any drainage out of the sides. Per his mother is here with him she says it is slightly better today than what it was yesterday. They have not this really done anything to try for it yet. They have not tried anything new. They have been trying Aquaphor for this just for moisturizing which doesn't seem to be helping much.  Relevant past medical, surgical, family and social history reviewed and updated as indicated. Interim medical history since our last visit reviewed. Allergies and medications reviewed and updated.  Review of Systems  Constitutional: Negative for chills and fever.  Eyes: Negative for discharge.  Respiratory: Negative for shortness of breath and wheezing.   Cardiovascular: Negative for chest pain and leg swelling.  Skin: Positive for rash.  All other systems reviewed and are negative.   Per HPI unless specifically indicated above     Medication List       Accurate as of 12/17/15  7:07 PM. Always use your most recent med list.          aspirin EC 81 MG tablet Take 81 mg by mouth every morning.   docusate sodium 100 MG capsule Commonly known as:   COLACE Take 100 mg by mouth daily as needed.   econazole nitrate 1 % cream Apply 1 application topically 2 (two) times daily.   metroNIDAZOLE 1 % gel Commonly known as:  METROGEL Apply topically daily. Use for 7 days   pantoprazole 40 MG tablet Commonly known as:  PROTONIX Take 1 Tablet by mouth every morning   simvastatin 40 MG tablet Commonly known as:  ZOCOR Take 1 Tablet by mouth once daily AT SIX IN THE EVENING   tamsulosin 0.4 MG Caps capsule Commonly known as:  FLOMAX Take 1 Capsule by mouth every morning          Objective:    BP 108/67 (BP Location: Right Arm, Patient Position: Sitting, Cuff Size: Normal)   Pulse 81   Temp 98 F (36.7 C) (Oral)   Ht 5\' 2"  (1.575 m)   Wt 172 lb (78 kg)   BMI 31.46 kg/m   Wt Readings from Last 3 Encounters:  12/17/15 172 lb (78 kg)  12/03/15 171 lb 9.6 oz (77.8 kg)  12/01/15 173 lb (78.5 kg)    Physical Exam  Constitutional: He is oriented to person, place, and time. He appears well-developed and well-nourished. No distress.  Eyes: Conjunctivae are normal. Right eye exhibits no discharge. No scleral icterus.  Cardiovascular: Normal rate, regular rhythm, normal heart sounds and intact distal pulses.   No murmur heard. Pulmonary/Chest: Effort normal and breath sounds normal. No respiratory distress. He has no wheezes.  Musculoskeletal: Normal range of motion. He exhibits no edema.  Neurological: He is alert and oriented to person, place, and time. Coordination normal.  Skin: Skin is warm and dry. Rash (Red rash under her nose and slightly under cheeks on both sides. The irregular borders, not raised, nontender.) noted. He is not diaphoretic.  Psychiatric: He has a normal mood and affect. His behavior is normal.  Nursing note and vitals reviewed.     Assessment & Plan:   Problem List Items Addressed This Visit    None    Visit Diagnoses    Rosacea    -  Primary   Relevant Medications   metroNIDAZOLE (METROGEL) 1 % gel    Encounter for immunization       Relevant Orders   Flu Vaccine QUAD 36+ mos IM (Completed)       Follow up plan: Return if symptoms worsen or fail to improve.  Counseling provided for all of the vaccine components Orders Placed This Encounter  Procedures  . Flu Vaccine QUAD 36+ mos IM    Caryl Pina, MD [facility] 12/17/2015, 7:07 PM

## 2015-12-22 DIAGNOSIS — R339 Retention of urine, unspecified: Secondary | ICD-10-CM | POA: Diagnosis not present

## 2016-01-18 DIAGNOSIS — N3941 Urge incontinence: Secondary | ICD-10-CM | POA: Diagnosis not present

## 2016-01-18 DIAGNOSIS — N401 Enlarged prostate with lower urinary tract symptoms: Secondary | ICD-10-CM | POA: Diagnosis not present

## 2016-01-18 DIAGNOSIS — N359 Urethral stricture, unspecified: Secondary | ICD-10-CM | POA: Diagnosis not present

## 2016-01-18 DIAGNOSIS — N1330 Unspecified hydronephrosis: Secondary | ICD-10-CM | POA: Diagnosis not present

## 2016-01-19 DIAGNOSIS — B351 Tinea unguium: Secondary | ICD-10-CM | POA: Diagnosis not present

## 2016-01-19 DIAGNOSIS — M79676 Pain in unspecified toe(s): Secondary | ICD-10-CM | POA: Diagnosis not present

## 2016-02-01 ENCOUNTER — Other Ambulatory Visit (HOSPITAL_COMMUNITY): Payer: Self-pay | Admitting: Urology

## 2016-02-01 DIAGNOSIS — N1330 Unspecified hydronephrosis: Secondary | ICD-10-CM

## 2016-02-02 DIAGNOSIS — N359 Urethral stricture, unspecified: Secondary | ICD-10-CM | POA: Diagnosis not present

## 2016-02-02 DIAGNOSIS — R339 Retention of urine, unspecified: Secondary | ICD-10-CM | POA: Diagnosis not present

## 2016-02-23 ENCOUNTER — Other Ambulatory Visit: Payer: Self-pay | Admitting: *Deleted

## 2016-03-16 DIAGNOSIS — N3020 Other chronic cystitis without hematuria: Secondary | ICD-10-CM | POA: Diagnosis not present

## 2016-03-16 DIAGNOSIS — N1330 Unspecified hydronephrosis: Secondary | ICD-10-CM | POA: Diagnosis not present

## 2016-03-16 DIAGNOSIS — N401 Enlarged prostate with lower urinary tract symptoms: Secondary | ICD-10-CM | POA: Diagnosis not present

## 2016-03-16 DIAGNOSIS — N359 Urethral stricture, unspecified: Secondary | ICD-10-CM | POA: Diagnosis not present

## 2016-03-23 ENCOUNTER — Ambulatory Visit (HOSPITAL_COMMUNITY): Payer: Medicare Other

## 2016-04-13 DIAGNOSIS — R339 Retention of urine, unspecified: Secondary | ICD-10-CM | POA: Diagnosis not present

## 2016-04-14 ENCOUNTER — Other Ambulatory Visit: Payer: Self-pay | Admitting: Family Medicine

## 2016-04-18 ENCOUNTER — Other Ambulatory Visit: Payer: Self-pay | Admitting: Family Medicine

## 2016-04-18 DIAGNOSIS — K219 Gastro-esophageal reflux disease without esophagitis: Secondary | ICD-10-CM

## 2016-04-19 ENCOUNTER — Other Ambulatory Visit: Payer: Medicare Other

## 2016-04-19 DIAGNOSIS — N289 Disorder of kidney and ureter, unspecified: Secondary | ICD-10-CM

## 2016-04-19 DIAGNOSIS — E785 Hyperlipidemia, unspecified: Secondary | ICD-10-CM | POA: Diagnosis not present

## 2016-04-19 DIAGNOSIS — Z1159 Encounter for screening for other viral diseases: Secondary | ICD-10-CM

## 2016-04-19 DIAGNOSIS — B351 Tinea unguium: Secondary | ICD-10-CM | POA: Diagnosis not present

## 2016-04-19 DIAGNOSIS — M79676 Pain in unspecified toe(s): Secondary | ICD-10-CM | POA: Diagnosis not present

## 2016-04-20 LAB — CMP14+EGFR
ALBUMIN: 3.1 g/dL — AB (ref 3.5–5.5)
ALT: 12 IU/L (ref 0–44)
AST: 13 IU/L (ref 0–40)
Albumin/Globulin Ratio: 0.9 — ABNORMAL LOW (ref 1.2–2.2)
Alkaline Phosphatase: 74 IU/L (ref 39–117)
BILIRUBIN TOTAL: 0.3 mg/dL (ref 0.0–1.2)
BUN / CREAT RATIO: 10 (ref 9–20)
BUN: 23 mg/dL (ref 6–24)
CALCIUM: 8.8 mg/dL (ref 8.7–10.2)
CHLORIDE: 102 mmol/L (ref 96–106)
CO2: 25 mmol/L (ref 18–29)
CREATININE: 2.31 mg/dL — AB (ref 0.76–1.27)
GFR, EST AFRICAN AMERICAN: 35 mL/min/{1.73_m2} — AB (ref >59)
GFR, EST NON AFRICAN AMERICAN: 30 mL/min/{1.73_m2} — AB (ref >59)
Globulin, Total: 3.5 g/dL (ref 1.5–4.5)
Glucose: 89 mg/dL (ref 65–99)
Potassium: 5.3 mmol/L — ABNORMAL HIGH (ref 3.5–5.2)
Sodium: 142 mmol/L (ref 134–144)
TOTAL PROTEIN: 6.6 g/dL (ref 6.0–8.5)

## 2016-04-20 LAB — LIPID PANEL
CHOL/HDL RATIO: 3 ratio (ref 0.0–5.0)
Cholesterol, Total: 137 mg/dL (ref 100–199)
HDL: 45 mg/dL (ref >39)
LDL CALC: 73 mg/dL (ref 0–99)
Triglycerides: 97 mg/dL (ref 0–149)
VLDL CHOLESTEROL CAL: 19 mg/dL (ref 5–40)

## 2016-04-20 LAB — HEPATITIS C ANTIBODY

## 2016-04-20 LAB — TSH: TSH: 2.74 u[IU]/mL (ref 0.450–4.500)

## 2016-05-04 ENCOUNTER — Encounter: Payer: Self-pay | Admitting: Family Medicine

## 2016-05-04 ENCOUNTER — Ambulatory Visit (INDEPENDENT_AMBULATORY_CARE_PROVIDER_SITE_OTHER): Payer: Medicare Other | Admitting: Family Medicine

## 2016-05-04 VITALS — BP 91/61 | HR 85 | Temp 98.7°F | Ht 62.0 in | Wt 173.2 lb

## 2016-05-04 DIAGNOSIS — E782 Mixed hyperlipidemia: Secondary | ICD-10-CM

## 2016-05-04 DIAGNOSIS — N183 Chronic kidney disease, stage 3 (moderate): Secondary | ICD-10-CM | POA: Diagnosis not present

## 2016-05-04 DIAGNOSIS — N179 Acute kidney failure, unspecified: Secondary | ICD-10-CM | POA: Diagnosis not present

## 2016-05-04 NOTE — Progress Notes (Signed)
BP 91/61   Pulse 85   Temp 98.7 F (37.1 C) (Oral)   Ht '5\' 2"'$  (1.575 m)   Wt 173 lb 4 oz (78.6 kg)   BMI 31.69 kg/m    Subjective:    Patient ID: Frank Levine, male    DOB: 14-Jan-1961, 56 y.o.   MRN: [id number]  HPI: Frank Levine is a 56 y.o. male presenting on 05/04/2016 for Hyperlipidemia (6 month followup, patient has already had labwork)   HPI Hyperlipidemia follow-up Patient is coming in for a hyperlipidemia recheck. He has labs checked 2 weeks ago and everything looks good on his cholesterol. He denies any myalgias or issues with medication. He has been taking it every night like it is prescribed.  Acute on chronic kidney disease Patient is coming in today for a recheck on labs for kidney damage. He has had some stage II chronic kidney disease but acutely had worsening on the last checkup 2 weeks ago and we'll recheck today. We also did a referral to nephrology when she has an appointment on March 13. He denies any changes or recent illnesses around when he had his labs. He denies being dehydrated that they know of. He was lasting that day since after midnight. No medication changes. He does not take any excessive amounts of ibuprofen or Motrin. He does take a baby aspirin but has been taking that for quite some time. He denies any difficulties urinating. He does have a catheter that he uses that they have to change sometimes but he does not think it's been clogged or stopped up anytime recently. He denies any abdominal pain or fevers or chills or dysuria.  Relevant past medical, surgical, family and social history reviewed and updated as indicated. Interim medical history since our last visit reviewed. Allergies and medications reviewed and updated.  Review of Systems  Constitutional: Negative for chills and fever.  Respiratory: Negative for shortness of breath and wheezing.   Cardiovascular: Negative for chest pain and leg swelling.  Gastrointestinal: Negative for abdominal  pain and blood in stool.  Genitourinary: Negative for decreased urine volume, difficulty urinating, dysuria, flank pain, frequency, hematuria and urgency.  Musculoskeletal: Negative for back pain and gait problem.  Skin: Negative for rash.  Neurological: Negative for dizziness, weakness, light-headedness and numbness.  All other systems reviewed and are negative.   Per HPI unless specifically indicated above     Objective:    BP 91/61   Pulse 85   Temp 98.7 F (37.1 C) (Oral)   Ht '5\' 2"'$  (1.575 m)   Wt 173 lb 4 oz (78.6 kg)   BMI 31.69 kg/m   Wt Readings from Last 3 Encounters:  05/04/16 173 lb 4 oz (78.6 kg)  12/17/15 172 lb (78 kg)  12/03/15 171 lb 9.6 oz (77.8 kg)    Physical Exam  Constitutional: He is oriented to person, place, and time. He appears well-developed and well-nourished. No distress.  Eyes: Conjunctivae are normal. No scleral icterus.  Neck: Neck supple. No thyromegaly present.  Cardiovascular: Normal rate, regular rhythm, normal heart sounds and intact distal pulses.   No murmur heard. Pulmonary/Chest: Effort normal and breath sounds normal. No respiratory distress. He has no wheezes. He has no rales.  Abdominal: Soft. Bowel sounds are normal. He exhibits no distension. There is no tenderness. There is no rebound and no guarding.  Musculoskeletal: Normal range of motion. He exhibits no edema.  Lymphadenopathy:    He has no cervical adenopathy.  Neurological: He is alert and oriented to person, place, and time. Coordination normal.  Skin: Skin is warm and dry. No rash noted. He is not diaphoretic.  Psychiatric: He has a normal mood and affect. His behavior is normal.  Nursing note and vitals reviewed.   Results for orders placed or performed in visit on 04/19/16  CMP14+EGFR  Result Value Ref Range   Glucose 89 65 - 99 mg/dL   BUN 23 6 - 24 mg/dL   Creatinine, Ser 2.31 (H) 0.76 - 1.27 mg/dL   GFR calc non Af Amer 30 (L) >59 mL/min/1.73   GFR calc Af  Amer 35 (L) >59 mL/min/1.73   BUN/Creatinine Ratio 10 9 - 20   Sodium 142 134 - 144 mmol/L   Potassium 5.3 (H) 3.5 - 5.2 mmol/L   Chloride 102 96 - 106 mmol/L   CO2 25 18 - 29 mmol/L   Calcium 8.8 8.7 - 10.2 mg/dL   Total Protein 6.6 6.0 - 8.5 g/dL   Albumin 3.1 (L) 3.5 - 5.5 g/dL   Globulin, Total 3.5 1.5 - 4.5 g/dL   Albumin/Globulin Ratio 0.9 (L) 1.2 - 2.2   Bilirubin Total 0.3 0.0 - 1.2 mg/dL   Alkaline Phosphatase 74 39 - 117 IU/L   AST 13 0 - 40 IU/L   ALT 12 0 - 44 IU/L  Lipid panel  Result Value Ref Range   Cholesterol, Total 137 100 - 199 mg/dL   Triglycerides 97 0 - 149 mg/dL   HDL 45 >39 mg/dL   VLDL Cholesterol Cal 19 5 - 40 mg/dL   LDL Calculated 73 0 - 99 mg/dL   Chol/HDL Ratio 3.0 0.0 - 5.0 ratio units  TSH  Result Value Ref Range   TSH 2.740 0.450 - 4.500 uIU/mL  Hepatitis C antibody  Result Value Ref Range   Hep C Virus Ab <0.1 0.0 - 0.9 s/co ratio      Assessment & Plan:   Problem List Items Addressed This Visit      Other   Hyperlipidemia - Primary    Other Visit Diagnoses    Acute renal failure superimposed on stage 3 chronic kidney disease, unspecified acute renal failure type ([place])       Already has referral to nephrology for March 13, we'll recheck today.   Relevant Orders   CMP14+EGFR       Follow up plan: Return if symptoms worsen or fail to improve.  Counseling provided for all of the vaccine components Orders Placed This Encounter  Procedures  . [place] Asuncion Shibata, MD [facility] 05/04/2016, 9:08 AM

## 2016-05-05 LAB — CMP14+EGFR
A/G RATIO: 1 — AB (ref 1.2–2.2)
ALBUMIN: 3.4 g/dL — AB (ref 3.5–5.5)
ALT: 13 IU/L (ref 0–44)
AST: 18 IU/L (ref 0–40)
Alkaline Phosphatase: 81 IU/L (ref 39–117)
BUN / CREAT RATIO: 10 (ref 9–20)
BUN: 22 mg/dL (ref 6–24)
Bilirubin Total: 0.3 mg/dL (ref 0.0–1.2)
CO2: 24 mmol/L (ref 18–29)
CREATININE: 2.15 mg/dL — AB (ref 0.76–1.27)
Calcium: 8.7 mg/dL (ref 8.7–10.2)
Chloride: 103 mmol/L (ref 96–106)
GFR calc Af Amer: 38 mL/min/{1.73_m2} — ABNORMAL LOW (ref >59)
GFR, EST NON AFRICAN AMERICAN: 33 mL/min/{1.73_m2} — AB (ref >59)
GLOBULIN, TOTAL: 3.3 g/dL (ref 1.5–4.5)
GLUCOSE: 77 mg/dL (ref 65–99)
POTASSIUM: 5.2 mmol/L (ref 3.5–5.2)
SODIUM: 143 mmol/L (ref 134–144)
TOTAL PROTEIN: 6.7 g/dL (ref 6.0–8.5)

## 2016-05-12 ENCOUNTER — Other Ambulatory Visit: Payer: Self-pay | Admitting: *Deleted

## 2016-05-12 MED ORDER — TAMSULOSIN HCL 0.4 MG PO CAPS: 0.4000 mg | ORAL_CAPSULE | Freq: Every morning | ORAL | 1 refills | Status: DC

## 2016-05-12 MED ORDER — SIMVASTATIN 40 MG PO TABS: ORAL_TABLET | ORAL | 1 refills | Status: DC

## 2016-05-18 DIAGNOSIS — N359 Urethral stricture, unspecified: Secondary | ICD-10-CM | POA: Diagnosis not present

## 2016-05-20 ENCOUNTER — Emergency Department (HOSPITAL_COMMUNITY): Payer: Medicare Other

## 2016-05-20 ENCOUNTER — Encounter (HOSPITAL_COMMUNITY): Payer: Self-pay | Admitting: Emergency Medicine

## 2016-05-20 ENCOUNTER — Inpatient Hospital Stay (HOSPITAL_COMMUNITY)
Admission: EM | Admit: 2016-05-20 | Discharge: 2016-05-26 | DRG: 872 | Disposition: A | Discharge status: Home Health | Payer: Medicare Other | Attending: Internal Medicine | Admitting: Internal Medicine

## 2016-05-20 DIAGNOSIS — R079 Chest pain, unspecified: Secondary | ICD-10-CM

## 2016-05-20 DIAGNOSIS — E782 Mixed hyperlipidemia: Secondary | ICD-10-CM | POA: Diagnosis not present

## 2016-05-20 DIAGNOSIS — I959 Hypotension, unspecified: Secondary | ICD-10-CM | POA: Diagnosis present

## 2016-05-20 DIAGNOSIS — N183 Chronic kidney disease, stage 3 unspecified: Secondary | ICD-10-CM | POA: Diagnosis present

## 2016-05-20 DIAGNOSIS — M25569 Pain in unspecified knee: Secondary | ICD-10-CM

## 2016-05-20 DIAGNOSIS — A419 Sepsis, unspecified organism: Principal | ICD-10-CM | POA: Diagnosis present

## 2016-05-20 DIAGNOSIS — N320 Bladder-neck obstruction: Secondary | ICD-10-CM | POA: Diagnosis present

## 2016-05-20 DIAGNOSIS — Z86711 Personal history of pulmonary embolism: Secondary | ICD-10-CM | POA: Diagnosis not present

## 2016-05-20 DIAGNOSIS — N136 Pyonephrosis: Secondary | ICD-10-CM | POA: Diagnosis not present

## 2016-05-20 DIAGNOSIS — N179 Acute kidney failure, unspecified: Secondary | ICD-10-CM | POA: Diagnosis not present

## 2016-05-20 DIAGNOSIS — R140 Abdominal distension (gaseous): Secondary | ICD-10-CM | POA: Diagnosis not present

## 2016-05-20 DIAGNOSIS — N390 Urinary tract infection, site not specified: Secondary | ICD-10-CM | POA: Diagnosis not present

## 2016-05-20 DIAGNOSIS — E876 Hypokalemia: Secondary | ICD-10-CM | POA: Diagnosis not present

## 2016-05-20 DIAGNOSIS — N1330 Unspecified hydronephrosis: Secondary | ICD-10-CM | POA: Diagnosis not present

## 2016-05-20 DIAGNOSIS — K219 Gastro-esophageal reflux disease without esophagitis: Secondary | ICD-10-CM | POA: Diagnosis present

## 2016-05-20 DIAGNOSIS — N182 Chronic kidney disease, stage 2 (mild): Secondary | ICD-10-CM | POA: Diagnosis not present

## 2016-05-20 DIAGNOSIS — R1031 Right lower quadrant pain: Secondary | ICD-10-CM

## 2016-05-20 DIAGNOSIS — F79 Unspecified intellectual disabilities: Secondary | ICD-10-CM | POA: Diagnosis not present

## 2016-05-20 DIAGNOSIS — Z8249 Family history of ischemic heart disease and other diseases of the circulatory system: Secondary | ICD-10-CM | POA: Diagnosis not present

## 2016-05-20 DIAGNOSIS — R110 Nausea: Secondary | ICD-10-CM | POA: Diagnosis not present

## 2016-05-20 DIAGNOSIS — N358 Other urethral stricture: Secondary | ICD-10-CM | POA: Diagnosis not present

## 2016-05-20 DIAGNOSIS — N5089 Other specified disorders of the male genital organs: Secondary | ICD-10-CM | POA: Diagnosis not present

## 2016-05-20 DIAGNOSIS — D649 Anemia, unspecified: Secondary | ICD-10-CM | POA: Diagnosis present

## 2016-05-20 DIAGNOSIS — N12 Tubulo-interstitial nephritis, not specified as acute or chronic: Secondary | ICD-10-CM | POA: Diagnosis present

## 2016-05-20 DIAGNOSIS — E785 Hyperlipidemia, unspecified: Secondary | ICD-10-CM | POA: Diagnosis present

## 2016-05-20 DIAGNOSIS — N400 Enlarged prostate without lower urinary tract symptoms: Secondary | ICD-10-CM | POA: Diagnosis present

## 2016-05-20 DIAGNOSIS — A4152 Sepsis due to Pseudomonas: Secondary | ICD-10-CM | POA: Diagnosis not present

## 2016-05-20 DIAGNOSIS — Q909 Down syndrome, unspecified: Secondary | ICD-10-CM

## 2016-05-20 DIAGNOSIS — Z79899 Other long term (current) drug therapy: Secondary | ICD-10-CM | POA: Diagnosis not present

## 2016-05-20 DIAGNOSIS — N451 Epididymitis: Secondary | ICD-10-CM | POA: Diagnosis not present

## 2016-05-20 DIAGNOSIS — Z86718 Personal history of other venous thrombosis and embolism: Secondary | ICD-10-CM

## 2016-05-20 DIAGNOSIS — N453 Epididymo-orchitis: Secondary | ICD-10-CM | POA: Diagnosis not present

## 2016-05-20 DIAGNOSIS — N10 Acute tubulo-interstitial nephritis: Secondary | ICD-10-CM

## 2016-05-20 DIAGNOSIS — N319 Neuromuscular dysfunction of bladder, unspecified: Secondary | ICD-10-CM | POA: Diagnosis present

## 2016-05-20 DIAGNOSIS — Z435 Encounter for attention to cystostomy: Secondary | ICD-10-CM

## 2016-05-20 DIAGNOSIS — B965 Pseudomonas (aeruginosa) (mallei) (pseudomallei) as the cause of diseases classified elsewhere: Secondary | ICD-10-CM | POA: Diagnosis present

## 2016-05-20 DIAGNOSIS — M25561 Pain in right knee: Secondary | ICD-10-CM | POA: Diagnosis not present

## 2016-05-20 DIAGNOSIS — Z9359 Other cystostomy status: Secondary | ICD-10-CM | POA: Diagnosis not present

## 2016-05-20 DIAGNOSIS — R609 Edema, unspecified: Secondary | ICD-10-CM

## 2016-05-20 DIAGNOSIS — A498 Other bacterial infections of unspecified site: Secondary | ICD-10-CM | POA: Diagnosis not present

## 2016-05-20 DIAGNOSIS — M25461 Effusion, right knee: Secondary | ICD-10-CM | POA: Diagnosis not present

## 2016-05-20 DIAGNOSIS — K2970 Gastritis, unspecified, without bleeding: Secondary | ICD-10-CM | POA: Diagnosis not present

## 2016-05-20 DIAGNOSIS — N452 Orchitis: Secondary | ICD-10-CM

## 2016-05-20 DIAGNOSIS — Z466 Encounter for fitting and adjustment of urinary device: Secondary | ICD-10-CM | POA: Diagnosis not present

## 2016-05-20 DIAGNOSIS — R109 Unspecified abdominal pain: Secondary | ICD-10-CM | POA: Diagnosis not present

## 2016-05-20 DIAGNOSIS — R6520 Severe sepsis without septic shock: Secondary | ICD-10-CM | POA: Diagnosis not present

## 2016-05-20 LAB — LACTIC ACID, PLASMA
Lactic Acid, Venous: 1.5 mmol/L (ref 0.5–1.9)
Lactic Acid, Venous: 1.2 mmol/L (ref 0.5–1.9)

## 2016-05-20 LAB — CBC WITH DIFFERENTIAL/PLATELET
BASOS ABS: 0.1 10*3/uL (ref 0.0–0.1)
Basophils Relative: 1 %
EOS PCT: 1 %
Eosinophils Absolute: 0.1 10*3/uL (ref 0.0–0.7)
HCT: 37.4 % — ABNORMAL LOW (ref 39.0–52.0)
Hemoglobin: 12.9 g/dL — ABNORMAL LOW (ref 13.0–17.0)
LYMPHS ABS: 0.9 10*3/uL (ref 0.7–4.0)
LYMPHS PCT: 8 %
MCH: 33.3 pg (ref 26.0–34.0)
MCHC: 34.5 g/dL (ref 30.0–36.0)
MCV: 96.6 fL (ref 78.0–100.0)
Monocytes Absolute: 0.7 10*3/uL (ref 0.1–1.0)
Monocytes Relative: 7 %
NEUTROS ABS: 8.6 10*3/uL — AB (ref 1.7–7.7)
NEUTROS PCT: 83 %
PLATELETS: 294 10*3/uL (ref 150–400)
RBC: 3.87 MIL/uL — AB (ref 4.22–5.81)
RDW: 14.6 % (ref 11.5–15.5)
WBC: 10.3 10*3/uL (ref 4.0–10.5)

## 2016-05-20 LAB — COMPREHENSIVE METABOLIC PANEL
ALBUMIN: 2.7 g/dL — AB (ref 3.5–5.0)
ALK PHOS: 59 U/L (ref 38–126)
ALT: 11 U/L — AB (ref 17–63)
AST: 15 U/L (ref 15–41)
Anion gap: 7 (ref 5–15)
BUN: 20 mg/dL (ref 6–20)
CALCIUM: 8 mg/dL — AB (ref 8.9–10.3)
CO2: 23 mmol/L (ref 22–32)
CREATININE: 2.16 mg/dL — AB (ref 0.61–1.24)
Chloride: 109 mmol/L (ref 101–111)
GFR calc non Af Amer: 32 mL/min — ABNORMAL LOW (ref >60)
GFR, EST AFRICAN AMERICAN: 38 mL/min — AB (ref >60)
Glucose, Bld: 133 mg/dL — ABNORMAL HIGH (ref 65–99)
Potassium: 3.8 mmol/L (ref 3.5–5.1)
SODIUM: 139 mmol/L (ref 135–145)
Total Bilirubin: 0.8 mg/dL (ref 0.3–1.2)
Total Protein: 6.9 g/dL (ref 6.5–8.1)

## 2016-05-20 LAB — URINALYSIS, ROUTINE W REFLEX MICROSCOPIC
Bilirubin Urine: NEGATIVE
Glucose, UA: NEGATIVE mg/dL
Ketones, ur: NEGATIVE mg/dL
Nitrite: POSITIVE — AB
Protein, ur: 100 mg/dL — AB
SPECIFIC GRAVITY, URINE: 1.008 (ref 1.005–1.030)
Squamous Epithelial / LPF: NONE SEEN
pH: 8 (ref 5.0–8.0)

## 2016-05-20 LAB — LIPASE, BLOOD: Lipase: 14 U/L (ref 11–51)

## 2016-05-20 MED ORDER — SIMVASTATIN 40 MG PO TABS
40.0000 mg | ORAL_TABLET | Freq: Every day | ORAL | Status: DC
  Administered 2016-05-20 – 2016-05-25 (×6): 40 mg via ORAL
  Filled 2016-05-20 (×6): qty 1

## 2016-05-20 MED ORDER — VANCOMYCIN HCL IN DEXTROSE 1-5 GM/200ML-% IV SOLN
1000.0000 mg | Freq: Once | INTRAVENOUS | Status: AC
  Administered 2016-05-20: 1000 mg via INTRAVENOUS
  Filled 2016-05-20: qty 200

## 2016-05-20 MED ORDER — ACETAMINOPHEN 325 MG PO TABS
650.0000 mg | ORAL_TABLET | ORAL | Status: DC | PRN
  Administered 2016-05-20 – 2016-05-25 (×3): 650 mg via ORAL
  Filled 2016-05-20 (×3): qty 2

## 2016-05-20 MED ORDER — ENOXAPARIN SODIUM 40 MG/0.4ML ~~LOC~~ SOLN
40.0000 mg | SUBCUTANEOUS | Status: DC
  Administered 2016-05-20 – 2016-05-25 (×6): 40 mg via SUBCUTANEOUS
  Filled 2016-05-20 (×7): qty 0.4

## 2016-05-20 MED ORDER — CEFTRIAXONE SODIUM 1 G IJ SOLR
1.0000 g | Freq: Once | INTRAMUSCULAR | Status: AC
  Administered 2016-05-20: 1 g via INTRAVENOUS
  Filled 2016-05-20: qty 10

## 2016-05-20 MED ORDER — TRAMADOL HCL 50 MG PO TABS
100.0000 mg | ORAL_TABLET | Freq: Four times a day (QID) | ORAL | Status: DC | PRN
  Administered 2016-05-21 – 2016-05-25 (×4): 100 mg via ORAL
  Filled 2016-05-20 (×4): qty 2

## 2016-05-20 MED ORDER — SODIUM CHLORIDE 0.9 % IV SOLN
INTRAVENOUS | Status: DC
  Administered 2016-05-20 – 2016-05-24 (×8): via INTRAVENOUS

## 2016-05-20 MED ORDER — ASPIRIN EC 81 MG PO TBEC
81.0000 mg | DELAYED_RELEASE_TABLET | Freq: Every day | ORAL | Status: DC
Start: 2016-05-20 — End: 2016-05-26
  Administered 2016-05-20 – 2016-05-26 (×7): 81 mg via ORAL
  Filled 2016-05-20 (×7): qty 1

## 2016-05-20 MED ORDER — IBUPROFEN 200 MG PO TABS
400.0000 mg | ORAL_TABLET | Freq: Once | ORAL | Status: AC
Start: 2016-05-20 — End: 2016-05-20
  Administered 2016-05-20: 400 mg via ORAL
  Filled 2016-05-20: qty 2

## 2016-05-20 MED ORDER — DOCUSATE SODIUM 100 MG PO CAPS
100.0000 mg | ORAL_CAPSULE | Freq: Every day | ORAL | Status: DC
  Administered 2016-05-20 – 2016-05-25 (×6): 100 mg via ORAL
  Filled 2016-05-20 (×6): qty 1

## 2016-05-20 MED ORDER — SODIUM CHLORIDE 0.9 % IV BOLUS (SEPSIS)
1000.0000 mL | Freq: Once | INTRAVENOUS | Status: AC
  Administered 2016-05-20: 1000 mL via INTRAVENOUS

## 2016-05-20 MED ORDER — PIPERACILLIN-TAZOBACTAM 3.375 G IVPB
3.3750 g | Freq: Three times a day (TID) | INTRAVENOUS | Status: DC
  Administered 2016-05-20 – 2016-05-23 (×10): 3.375 g via INTRAVENOUS
  Filled 2016-05-20 (×11): qty 50

## 2016-05-20 MED ORDER — IOPAMIDOL (ISOVUE-300) INJECTION 61%
30.0000 mL | Freq: Once | INTRAVENOUS | Status: AC | PRN
  Administered 2016-05-20: 30 mL via ORAL

## 2016-05-20 MED ORDER — ONDANSETRON HCL 4 MG/2ML IJ SOLN
4.0000 mg | Freq: Once | INTRAMUSCULAR | Status: AC
  Administered 2016-05-20: 4 mg via INTRAVENOUS
  Filled 2016-05-20: qty 2

## 2016-05-20 MED ORDER — SODIUM CHLORIDE 0.9 % IV BOLUS (SEPSIS)
2000.0000 mL | Freq: Once | INTRAVENOUS | Status: AC
  Administered 2016-05-20: 2000 mL via INTRAVENOUS

## 2016-05-20 MED ORDER — IOPAMIDOL (ISOVUE-300) INJECTION 61%
INTRAVENOUS | Status: AC
  Administered 2016-05-20: 30 mL via ORAL
  Filled 2016-05-20: qty 30

## 2016-05-20 MED ORDER — MORPHINE SULFATE (PF) 4 MG/ML IV SOLN
1.0000 mg | INTRAVENOUS | Status: DC | PRN
  Administered 2016-05-20: 2 mg via INTRAVENOUS
  Filled 2016-05-20: qty 1

## 2016-05-20 MED ORDER — PANTOPRAZOLE SODIUM 40 MG PO TBEC
40.0000 mg | DELAYED_RELEASE_TABLET | Freq: Every morning | ORAL | Status: DC
  Administered 2016-05-20 – 2016-05-26 (×7): 40 mg via ORAL
  Filled 2016-05-20 (×7): qty 1

## 2016-05-20 MED ORDER — TAMSULOSIN HCL 0.4 MG PO CAPS
0.4000 mg | ORAL_CAPSULE | Freq: Every morning | ORAL | Status: DC
  Administered 2016-05-20 – 2016-05-26 (×7): 0.4 mg via ORAL
  Filled 2016-05-20 (×7): qty 1

## 2016-05-20 MED ORDER — FENTANYL CITRATE (PF) 100 MCG/2ML IJ SOLN
50.0000 ug | Freq: Once | INTRAMUSCULAR | Status: AC
  Administered 2016-05-20: 50 ug via INTRAVENOUS
  Filled 2016-05-20: qty 2

## 2016-05-20 NOTE — ED Triage Notes (Signed)
Per EMs , pt. From home with complaint of right lower quadrant pain with nausea ,started last night at 11pm. Per pt.'s mother pt. Could have possible UTI for the same complaint last time he was diagnosed with UTI. Pt. Has F/C which was changed last Wednesday.  No report of fever. Received 4mg  IV Zofran per EMS.

## 2016-05-20 NOTE — Consult Note (Signed)
Urology Consult  Referring physician: Dr. Karleen Hampshire Reason for referral: bilateral hydronephrosis  Chief Complaint: right abdominal pain  History of Present Illness: Frank Levine is a 56yo with a hisotry of Downs syndrome with a Urologic history significant for urethral stricture disease, neurogenic bladder managed with SP tube, chronic bilateral hydronephrosis, who was admitted with right abdominal pain and sepsis from a urinary source. Per mother and the patient he developed severe abdominal pain today but he had mild abdominal pain for several days. The pain is currently dull, constant, moderate nonradiating right abdominal pain. He has associated nausea. No suprapubic pain. No leakage around SP tube.  He underwent CT abd/pelvis which showed a thick walled decompressed bladder, right moderate hydronephrosis with ureteral and perinephric stranding, mild left hydronephrosis.   Past Medical History:  Diagnosis Date  . Anxiety   . Arthritis   . Bilateral hydronephrosis   . BPH (benign prostatic hypertrophy)   . Chronic cystitis   . Chronic urethral stricture    urologist-  dr Risa Grill  . CKD (chronic kidney disease), stage II   . Down's syndrome 09/27/2011  . GERD (gastroesophageal reflux disease)    OCCASIONAL  . History of DVT of lower extremity    POST GU SURGERY  2013  &  2011  . History of Meckel's diverticulum    BLADDER  . History of pulmonary embolus (PE)    DEC 2011  [place] --  [place]  FOR 6 MONTHS  . Hyperlipemia   . Mental retardation   . Neurogenic bladder   . Wears glasses    Past Surgical History:  Procedure Laterality Date  . BALLOON DILATION N/A 06/03/2015   Procedure: BALLOON DILATION;  Surgeon: Rana Snare, MD;  Location: [facility];  Service: Urology;  Laterality: N/A;  . CYSTO/  BALLOON DILATION URETHRAL STRICTURE/  REMOVAL & REPLACEMENT RIGHT URETERAL STETN/  RIGHT RETROGRADE PYELOGRAM/   LEFT URETEROSCOPY/  ATTEMPTED REMOVAL LEFT STENT  05-10-2010   . CYSTO/  BALLOON DILATION URETHRAL STRICTURE/  URETEROSCOPY/ REMOVAL LEFT STENT  10-17-2010  . CYSTO/  BALLOON DILATTION URETHRAL STRICTURE/ BILATERAL RETROGRADE PYELOGRAM/ BILATERAL URETERAL STENTS/ BLADDER BX/  LEFT URETEROSCOPY  01-27-2010  . CYSTOGRAM N/A 07/10/2013   Procedure: CYSTOGRAM;  Surgeon: Bernestine Amass, MD;  Location: [facility];  Service: Urology;  Laterality: N/A;  . CYSTOGRAM N/A 07/02/2014   Procedure: CYSTOGRAM;  Surgeon: Rana Snare, MD;  Location: [facility];  Service: Urology;  Laterality: N/A;  . CYSTOSCOPY W/ RETROGRADES Right 06/03/2015   Procedure: CYSTOSCOPY WITH RIGHT RETROGRADE PYELOGRAM CYSTOGRAM;  Surgeon: Rana Snare, MD;  Location: [facility];  Service: Urology;  Laterality: Right;  . CYSTOSCOPY WITH URETHRAL DILATATION N/A 07/10/2013   Procedure: CYSTOSCOPY WITH URETHRAL DILATATION, BALLOON DILITATION, RIGHT  RETROGRADE;  Surgeon: Bernestine Amass, MD;  Location: [facility];  Service: Urology;  Laterality: N/A;  . CYSTOSCOPY WITH URETHRAL DILATATION N/A 07/02/2014   Procedure: CYSTOSCOPY WITH URETHRAL DILATATION;  Surgeon: Rana Snare, MD;  Location: [facility];  Service: Urology;  Laterality: N/A;  . INSERTION OF SUPRAPUBIC CATHETER N/A 06/03/2015   Procedure: INSERTION OF SUPRAPUBIC CATHETER;  Surgeon: Rana Snare, MD;  Location: [facility];  Service: Urology;  Laterality: N/A;    Medications: I have reviewed the patient's current medications. Allergies: No Known Allergies  Family History  Problem Relation Age of Onset  . Hypertension Mother   . Hyperlipidemia Mother   .  Cancer Father    Social History:  reports that he has never smoked. He has never used smokeless tobacco. He reports that he does not drink alcohol or use drugs.  Review of Systems  Constitutional: Positive for fever and malaise/fatigue.  Gastrointestinal: Positive for abdominal pain and  nausea.  Neurological: Positive for weakness.  All other systems reviewed and are negative.   Physical Exam:  Vital signs in last 24 hours: Temp:  [97.9 F (36.6 C)-101 F (38.3 C)] 99.2 F (37.3 C) (03/16 2120) Pulse Rate:  [80-125] 104 (03/16 2120) Resp:  [16-25] 20 (03/16 2120) BP: (80-136)/(49-90) 80/49 (03/16 2120) SpO2:  [92 %-99 %] 96 % (03/16 2120) Weight:  [79.2 kg (174 lb 9.7 oz)] 79.2 kg (174 lb 9.7 oz) (03/16 0917) Physical Exam  Constitutional: He appears well-developed and well-nourished.  HENT:  Head: Normocephalic and atraumatic.  Eyes: EOM are normal. Pupils are equal, round, and reactive to light.  Neck: Normal range of motion. No thyromegaly present.  Cardiovascular: Normal rate and regular rhythm.   Respiratory: Effort normal and breath sounds normal.  GI: Soft. He exhibits no distension.  Genitourinary: Testes normal and penis normal. No penile tenderness.  Musculoskeletal: Normal range of motion. He exhibits no edema.  Neurological: He is alert. No cranial nerve deficit.  Skin: Skin is warm and dry.  Psychiatric: He has a normal mood and affect. His behavior is normal.    Laboratory Data:  Results for orders placed or performed during the hospital encounter of 05/20/16 (from the past 72 hour(s))  CBC with Differential/Platelet     Status: Abnormal   Collection Time: 05/20/16  2:39 AM  Result Value Ref Range   WBC 10.3 4.0 - 10.5 K/uL   RBC 3.87 (L) 4.22 - 5.81 MIL/uL   Hemoglobin 12.9 (L) 13.0 - 17.0 g/dL   HCT 37.4 (L) 39.0 - 52.0 %   MCV 96.6 78.0 - 100.0 fL   MCH 33.3 26.0 - 34.0 pg   MCHC 34.5 30.0 - 36.0 g/dL   RDW 14.6 11.5 - 15.5 %   Platelets 294 150 - 400 K/uL   Neutrophils Relative % 83 %   Neutro Abs 8.6 (H) 1.7 - 7.7 K/uL   Lymphocytes Relative 8 %   Lymphs Abs 0.9 0.7 - 4.0 K/uL   Monocytes Relative 7 %   Monocytes Absolute 0.7 0.1 - 1.0 K/uL   Eosinophils Relative 1 %   Eosinophils Absolute 0.1 0.0 - 0.7 K/uL   Basophils  Relative 1 %   Basophils Absolute 0.1 0.0 - 0.1 K/uL  Urinalysis, Routine w reflex microscopic     Status: Abnormal   Collection Time: 05/20/16  3:48 AM  Result Value Ref Range   Color, Urine YELLOW YELLOW   APPearance CLOUDY (A) CLEAR   Specific Gravity, Urine 1.008 1.005 - 1.030   pH 8.0 5.0 - 8.0   Glucose, UA NEGATIVE NEGATIVE mg/dL   Hgb urine dipstick SMALL (A) NEGATIVE   Bilirubin Urine NEGATIVE NEGATIVE   Ketones, ur NEGATIVE NEGATIVE mg/dL   Protein, ur 100 (A) NEGATIVE mg/dL   Nitrite POSITIVE (A) NEGATIVE   Leukocytes, UA LARGE (A) NEGATIVE   RBC / HPF TOO NUMEROUS TO COUNT 0 - 5 RBC/hpf   WBC, UA TOO NUMEROUS TO COUNT 0 - 5 WBC/hpf   Bacteria, UA MANY (A) NONE SEEN   Squamous Epithelial / LPF NONE SEEN NONE SEEN   WBC Clumps PRESENT    Mucous PRESENT  Non Squamous Epithelial 0-5 (A) NONE SEEN  Comprehensive metabolic panel     Status: Abnormal   Collection Time: 05/20/16  4:23 AM  Result Value Ref Range   Sodium 139 135 - 145 mmol/L   Potassium 3.8 3.5 - 5.1 mmol/L   Chloride 109 101 - 111 mmol/L   CO2 23 22 - 32 mmol/L   Glucose, Bld 133 (H) 65 - 99 mg/dL   BUN 20 6 - 20 mg/dL   Creatinine, Ser 2.16 (H) 0.61 - 1.24 mg/dL   Calcium 8.0 (L) 8.9 - 10.3 mg/dL   Total Protein 6.9 6.5 - 8.1 g/dL   Albumin 2.7 (L) 3.5 - 5.0 g/dL   AST 15 15 - 41 U/L   ALT 11 (L) 17 - 63 U/L   Alkaline Phosphatase 59 38 - 126 U/L   Total Bilirubin 0.8 0.3 - 1.2 mg/dL   GFR calc non Af Amer 32 (L) >60 mL/min   GFR calc Af Amer 38 (L) >60 mL/min    Comment: (NOTE) The eGFR has been calculated using the CKD EPI equation. This calculation has not been validated in all clinical situations. eGFR's persistently <60 mL/min signify possible Chronic Kidney Disease.    Anion gap 7 5 - 15  Lipase, blood     Status: None   Collection Time: 05/20/16  4:23 AM  Result Value Ref Range   Lipase 14 11 - 51 U/L  Lactic acid, plasma     Status: None   Collection Time: 05/20/16 10:24 AM   Result Value Ref Range   Lactic Acid, Venous 1.5 0.5 - 1.9 mmol/L   No results found for this or any previous visit (from the past 240 hour(s)). Creatinine:  Recent Labs  05/20/16 0423  CREATININE 2.16*   Baseline Creatinine: 1.5  Impression/Assessment:  55yo with urethral stricture disease, neurogenic bladder, bilateral hydronephrosis  Plan:  1. Bilateral hydronephrosis with stranding: The patient has pyelonephritis with chronic hydronephrosis likely related to chronic bladder outlet obstruction. Please add vancomycin to antibiotic therapy to cover enterococcus. The patient is currently improving with fluid resusitation. If he develops worsening hypotension and tachycardia while on vancomycin and zosyn then he will need decompression of bilateral kidneys. Due to his urethral stricture disease and small contracted bladder ureteral stent placement will not be feasible. He will need nephrostomy tube placement if he fails to improve on antibiotic therapy.  Urology to continue to follow  Nicolette Bang 05/20/2016, 10:44 PM

## 2016-05-20 NOTE — ED Provider Notes (Signed)
[facility] DEPT Provider Note: Georgena Spurling, MD, FACEP  CSN: [id number] MRN: [id number] ARRIVAL: 05/20/16 at [place]: [id number]  By signing my name below, I, Oleh Genin, attest that this documentation has been prepared under the direction and in the presence of Shanon Rosser, MD. Electronically Signed: Oleh Genin, Scribe. 05/20/16. 2:42 AM.  CHIEF COMPLAINT  Abdominal Pain   HISTORY OF PRESENT ILLNESS  Frank Levine is a 56 y.o. male with history of Down syndrome and a complex urologic history with suprapubic catheter in place. He presents to the ED for evaluation of abdominal pain. The patient's mother assists with history. She states that approximately 4 hours ago the patient developed RLQ abdominal pain which has been intermittent and appeared severe at times. Pain is worse with movement or palpation. The patient's mother states that he comprehends and reports pain with difficulty. The patient is reporting nausea; however no vomiting. No documented fever. They deny any acute problems with the suprapubic catheter which initially placed 10 months ago and changed about nine days ago.  Past Medical History:  Diagnosis Date  . Anxiety   . Arthritis   . Bilateral hydronephrosis   . BPH (benign prostatic hypertrophy)   . Chronic cystitis   . Chronic urethral stricture    urologist-  dr Risa Grill  . CKD (chronic kidney disease), stage II   . Down's syndrome 09/27/2011  . GERD (gastroesophageal reflux disease)    OCCASIONAL  . History of DVT of lower extremity    POST GU SURGERY  2013  &  2011  . History of Meckel's diverticulum    BLADDER  . History of pulmonary embolus (PE)    DEC 2011  [place] --  [place]  FOR 6 MONTHS  . Hyperlipemia   . Mental retardation   . Neurogenic bladder   . Wears glasses     Past Surgical History:  Procedure Laterality Date  . BALLOON DILATION N/A 06/03/2015   Procedure: BALLOON DILATION;  Surgeon: Rana Snare, MD;  Location:  [facility];  Service: Urology;  Laterality: N/A;  . CYSTO/  BALLOON DILATION URETHRAL STRICTURE/  REMOVAL & REPLACEMENT RIGHT URETERAL STETN/  RIGHT RETROGRADE PYELOGRAM/   LEFT URETEROSCOPY/  ATTEMPTED REMOVAL LEFT STENT  05-10-2010  . CYSTO/  BALLOON DILATION URETHRAL STRICTURE/  URETEROSCOPY/ REMOVAL LEFT STENT  10-17-2010  . CYSTO/  BALLOON DILATTION URETHRAL STRICTURE/ BILATERAL RETROGRADE PYELOGRAM/ BILATERAL URETERAL STENTS/ BLADDER BX/  LEFT URETEROSCOPY  01-27-2010  . CYSTOGRAM N/A 07/10/2013   Procedure: CYSTOGRAM;  Surgeon: Bernestine Amass, MD;  Location[facility];  Service: Urology;  Laterality: N/A;  . CYSTOGRAM N/A 07/02/2014   Procedure: CYSTOGRAM;  Surgeon: Rana Snare, MD;  Location: [facility];  Service: Urology;  Laterality: N/A;  . CYSTOSCOPY W/ RETROGRADES Right 06/03/2015   Procedure: CYSTOSCOPY WITH RIGHT RETROGRADE PYELOGRAM CYSTOGRAM;  Surgeon: Rana Snare, MD;  Location: [facility];  Service: Urology;  Laterality: Right;  . CYSTOSCOPY WITH URETHRAL DILATATION N/A 07/10/2013   Procedure: CYSTOSCOPY WITH URETHRAL DILATATION, BALLOON DILITATION, RIGHT  RETROGRADE;  Surgeon: Bernestine Amass, MD;  Location: [facility];  Service: Urology;  Laterality: N/A;  . CYSTOSCOPY WITH URETHRAL DILATATION N/A 07/02/2014   Procedure: CYSTOSCOPY WITH URETHRAL DILATATION;  Surgeon: Rana Snare, MD;  Location: [facility];  Service: Urology;  Laterality: N/A;  . INSERTION OF SUPRAPUBIC CATHETER N/A 06/03/2015   Procedure: INSERTION OF SUPRAPUBIC CATHETER;  Surgeon: Shanon Brow  Risa Grill, MD;  Location: [facility];  Service: Urology;  Laterality: N/A;    Family History  Problem Relation Age of Onset  . Hypertension Mother   . Hyperlipidemia Mother   . Cancer Father     Social History  Substance Use Topics  . Smoking status: Never Smoker  . Smokeless tobacco: Never Used  . Alcohol use No      Prior to Admission medications   Medication Sig Start Date End Date Taking? Authorizing Provider  aspirin EC 81 MG tablet Take 81 mg by mouth daily.    Yes Historical Provider, MD  docusate sodium (COLACE) 100 MG capsule Take 100 mg by mouth at bedtime.    Yes Historical Provider, MD  pantoprazole (PROTONIX) 40 MG tablet Take 1 Tablet by mouth every morning 04/18/16  Yes Timmothy Euler, MD  simvastatin (ZOCOR) 40 MG tablet Take 1 Tablet by mouth once daily AT 6pm Patient taking differently: Take 40 mg by mouth at bedtime.  05/12/16  Yes Fransisca Kaufmann Dettinger, MD  tamsulosin (FLOMAX) 0.4 MG CAPS capsule Take 1 capsule (0.4 mg total) by mouth every morning. 05/12/16  Yes Fransisca Kaufmann Dettinger, MD    Allergies Patient has no known allergies.   REVIEW OF SYSTEMS  Negative except as noted here or in the History of Present Illness.   PHYSICAL EXAMINATION  Initial Vital Signs Blood pressure 106/69, pulse 91, temperature 99.3 F (37.4 C), temperature source Oral, resp. rate 18, SpO2 93 %.  Examination General: Well-developed, well-nourished male in no acute distress; appearance consistent with age of record HENT: Down's facies; atraumatic Eyes: pupils equal, round and reactive to light; extraocular muscles intact; Brushfield spots Neck: supple Heart: regular rate and rhythm Lungs: clear to auscultation bilaterally Abdomen: soft; nondistended; RLQ tenderness; no suprapubic tenderness; no masses or hepatosplenomegaly; bowel sounds present Genitourinary: Suprapubic catheter draining cloudy yellow urine.  Extremities: hand abnormalities consistent with Down's syndrome; pulses normal Neurologic: Awake, alert; motor function intact in all extremities and symmetric; no facial droop Skin: Warm and dry; chronic-appearing stasis changes of lower legs Psychiatric: Normal mood and affect   RESULTS  Summary of this visit's results, reviewed by myself:   EKG Interpretation  Date/Time:     Ventricular Rate:    PR Interval:    QRS Duration:   QT Interval:    QTC Calculation:   R Axis:     Text Interpretation:        Laboratory Studies: Results for orders placed or performed during the hospital encounter of 05/20/16 (from the past 24 hour(s))  CBC with Differential/Platelet     Status: Abnormal   Collection Time: 05/20/16  2:39 AM  Result Value Ref Range   WBC 10.3 4.0 - 10.5 K/uL   RBC 3.87 (L) 4.22 - 5.81 MIL/uL   Hemoglobin 12.9 (L) 13.0 - 17.0 g/dL   HCT 37.4 (L) 39.0 - 52.0 %   MCV 96.6 78.0 - 100.0 fL   MCH 33.3 26.0 - 34.0 pg   MCHC 34.5 30.0 - 36.0 g/dL   RDW 14.6 11.5 - 15.5 %   Platelets 294 150 - 400 K/uL   Neutrophils Relative % 83 %   Neutro Abs 8.6 (H) 1.7 - 7.7 K/uL   Lymphocytes Relative 8 %   Lymphs Abs 0.9 0.7 - 4.0 K/uL   Monocytes Relative 7 %   Monocytes Absolute 0.7 0.1 - 1.0 K/uL   Eosinophils Relative 1 %   Eosinophils Absolute 0.1 0.0 -  0.7 K/uL   Basophils Relative 1 %   Basophils Absolute 0.1 0.0 - 0.1 K/uL  Urinalysis, Routine w reflex microscopic     Status: Abnormal   Collection Time: 05/20/16  3:48 AM  Result Value Ref Range   Color, Urine YELLOW YELLOW   APPearance CLOUDY (A) CLEAR   Specific Gravity, Urine 1.008 1.005 - 1.030   pH 8.0 5.0 - 8.0   Glucose, UA NEGATIVE NEGATIVE mg/dL   Hgb urine dipstick SMALL (A) NEGATIVE   Bilirubin Urine NEGATIVE NEGATIVE   Ketones, ur NEGATIVE NEGATIVE mg/dL   Protein, ur 100 (A) NEGATIVE mg/dL   Nitrite POSITIVE (A) NEGATIVE   Leukocytes, UA LARGE (A) NEGATIVE   RBC / HPF TOO NUMEROUS TO COUNT 0 - 5 RBC/hpf   WBC, UA TOO NUMEROUS TO COUNT 0 - 5 WBC/hpf   Bacteria, UA MANY (A) NONE SEEN   Squamous Epithelial / LPF NONE SEEN NONE SEEN   WBC Clumps PRESENT    Mucous PRESENT    Non Squamous Epithelial 0-5 (A) NONE SEEN  Comprehensive metabolic panel     Status: Abnormal   Collection Time: 05/20/16  4:23 AM  Result Value Ref Range   Sodium 139 135 - 145 mmol/L   Potassium 3.8 3.5  - 5.1 mmol/L   Chloride 109 101 - 111 mmol/L   CO2 23 22 - 32 mmol/L   Glucose, Bld 133 (H) 65 - 99 mg/dL   BUN 20 6 - 20 mg/dL   Creatinine, Ser 2.16 (H) 0.61 - 1.24 mg/dL   Calcium 8.0 (L) 8.9 - 10.3 mg/dL   Total Protein 6.9 6.5 - 8.1 g/dL   Albumin 2.7 (L) 3.5 - 5.0 g/dL   AST 15 15 - 41 U/L   ALT 11 (L) 17 - 63 U/L   Alkaline Phosphatase 59 38 - 126 U/L   Total Bilirubin 0.8 0.3 - 1.2 mg/dL   GFR calc non Af Amer 32 (L) >60 mL/min   GFR calc Af Amer 38 (L) >60 mL/min   Anion gap 7 5 - 15  Lipase, blood     Status: None   Collection Time: 05/20/16  4:23 AM  Result Value Ref Range   Lipase 14 11 - 51 U/L   Imaging Studies: Ct Abdomen Pelvis Wo Contrast  Result Date: 05/20/2016 CLINICAL DATA:  Right lower quadrant pain for several hours. Nausea. White cell count 10.3. Low GFR. History of Down's syndrome and bilateral hydronephrosis. Chronic cystitis. Urethral stricture. Chronic kidney disease. Gastroesophageal reflux disease. Meckel's diverticulum. Neurogenic bladder. EXAM: CT ABDOMEN AND PELVIS WITHOUT CONTRAST TECHNIQUE: Multidetector CT imaging of the abdomen and pelvis was performed following the standard protocol without IV contrast. COMPARISON:  05/04/2012 FINDINGS: Lower chest: Mild dependent atelectasis in the lung bases. Hepatobiliary: Tiny stones layering in the gallbladder. No gallbladder wall thickening or edema. No bile duct dilatation. No focal liver lesions. Pancreas: Fatty infiltration of the pancreas. No acute inflammatory changes. No ductal dilatation. Spleen: Normal in size without focal abnormality. Adrenals/Urinary Tract: No adrenal gland nodules. Prominent bilateral hydronephrosis and hydroureter down to the level of the bladder. Renal atrophy. Thickening of the wall of the ureter, particularly on the right, with stranding suggesting infection. Suprapubic catheter in the bladder. Prominent diffuse bladder wall thickening suggesting cystitis or bladder outlet  obstruction. Increased density of the bladder may indicate concentrated urine or hemorrhage. Correlation with urinalysis is recommended. Bladder diverticulum is decompressed since previous study. Small fat herniation in the suprapubic catheter insertion  site. Stomach/Bowel: Stomach and small bowel are unremarkable. There is a ventral abdominal wall hernia containing a loop of small bowel but without proximal obstruction. Scattered stool throughout the colon. No colonic distention or wall thickening. Appendix is normal. Vascular/Lymphatic: Normal caliber abdominal aorta and inferior vena cava. Retroperitoneal lymph nodes are present without pathologic enlargement. Reproductive: Prostate is unremarkable. Other: Small left inguinal hernia containing fat. No free air or free fluid in the abdomen. Musculoskeletal: Degenerative changes in the spine. No destructive bone lesions. IMPRESSION: Prominent hydronephrosis and hydroureter bilaterally. Ureteral wall thickening and stranding particularly on the right suggesting pyelitis. Bladder is decompressed with a suprapubic catheter, but there is evidence of diffuse bladder wall thickening. Increased density of the urine may indicate concentrated urine or hemorrhage. Correlation with urinalysis is recommended. Ventral abdominal wall hernia containing small bowel but without proximal obstruction. Left inguinal hernia containing fat. Small fat herniation at the suprapubic catheter insertion site. Electronically Signed   By: Lucienne Capers M.D.   On: 05/20/2016 05:56    ED COURSE  Nursing notes and initial vitals signs, including pulse oximetry, reviewed.  Vitals:   05/20/16 0356 05/20/16 0457 05/20/16 0509 05/20/16 0643  BP: 107/63  127/71 136/90  Pulse: 80 100 93 100  Resp: 18 18 16 18   Temp: 99.3 F (37.4 C) 99 F (37.2 C) 98.9 F (37.2 C) 98.9 F (37.2 C)  TempSrc: Oral Oral Oral Oral  SpO2: 92%  94% 96%   6:22 AM Rocephin one gram ordered for CT finding  suggesting right pyelitis. Suprapubic catheter changed. Hospitalist to admit.  PROCEDURES   SUPRAPUBIC CATHETER CHANGE The patient suprapubic catheter balloon was deflated and the catheter was withdrawn without difficulty. The patient's suprapubic cystostomy was prepped and draped in the usual sterile fashion. A 16 French Foley catheter was placed easily into the stoma and the balloon inflated. There was return of cloudy yellow urine. The patient tolerated this well and there were no immediate complications.  ED DIAGNOSES     ICD-9-CM ICD-10-CM   1. Acute pyelitis 590.10 N10   2. RLQ abdominal pain 789.03 R10.31 CT ABDOMEN PELVIS WO CONTRAST     CT ABDOMEN PELVIS WO CONTRAST  3. Encounter for suprapubic catheter care [facility]) V53.6 Z43.5     I personally performed the services described in this documentation, which was scribed in my presence. The recorded information has been reviewed and is accurate.    Shanon Rosser, MD 05/20/16 [phone]

## 2016-05-20 NOTE — ED Notes (Signed)
Pt has supra public foley replaced by provider  Area cleaned normal saline and bandaged per  Provider order.

## 2016-05-20 NOTE — H&P (Addendum)
History and Physical    Frank Levine:[id number] DOB: 12/07/1960 DOA: 05/20/2016  PCP: Fransisca Kaufmann Dettinger, MD Patient coming from: Home.   I have personally briefly reviewed patient's old medical records in [place]  Chief Complaint: abdominal pain   HPI: Frank Levine is a 56 y.o. male with medical history significant of chronic cystitis, chronic supra pubic catheter in use, down's syndrome, stage 2 CKD, h/o mental retardation and h/o DVT and PE, not on anticoagulation, bilateral hydronephrosis, presents from home with abdominal pain. On arrival to ED, he was found to have low grade fever, borderline bp parameters and labs significant for creatinine of 2.16, UA positive for leukocytes and nitrates, mucous and multiple bacteria. He also underwent CT abd and pelvis showing bilateral hydronephrosis, diffuse bladder thickening, increased density of the urine, consistent with pyelonephritis. He was referred to medical service for evaluation and management of pyelonephritis.  He received a dose of rocephin in ED for the infection.   Review of Systems: As per HPI otherwise 10 point review of systems negative.    Past Medical History:  Diagnosis Date  . Anxiety   . Arthritis   . Bilateral hydronephrosis   . BPH (benign prostatic hypertrophy)   . Chronic cystitis   . Chronic urethral stricture    urologist-  dr Risa Grill  . CKD (chronic kidney disease), stage II   . Down's syndrome 09/27/2011  . GERD (gastroesophageal reflux disease)    OCCASIONAL  . History of DVT of lower extremity    POST GU SURGERY  2013  &  2011  . History of Meckel's diverticulum    BLADDER  . History of pulmonary embolus (PE)    DEC 2011  [place] GU [place] --  [place]  FOR 6 MONTHS  . Hyperlipemia   . Mental retardation   . Neurogenic bladder   . Wears glasses     Past Surgical History:  Procedure Laterality Date  . BALLOON DILATION N/A 06/03/2015   Procedure: BALLOON DILATION;  Surgeon: Rana Snare, MD;   Location: [facility];  Service: Urology;  Laterality: N/A;  . CYSTO/  BALLOON DILATION URETHRAL STRICTURE/  REMOVAL & REPLACEMENT RIGHT URETERAL STETN/  RIGHT RETROGRADE PYELOGRAM/   LEFT URETEROSCOPY/  ATTEMPTED REMOVAL LEFT STENT  05-10-2010  . CYSTO/  BALLOON DILATION URETHRAL STRICTURE/  URETEROSCOPY/ REMOVAL LEFT STENT  10-17-2010  . CYSTO/  BALLOON DILATTION URETHRAL STRICTURE/ BILATERAL RETROGRADE PYELOGRAM/ BILATERAL URETERAL STENTS/ BLADDER BX/  LEFT URETEROSCOPY  01-27-2010  . CYSTOGRAM N/A 07/10/2013   Procedure: CYSTOGRAM;  Surgeon: Bernestine Amass, MD;  Location: [facility];  Service: Urology;  Laterality: N/A;  . CYSTOGRAM N/A 07/02/2014   Procedure: CYSTOGRAM;  Surgeon: Rana Snare, MD;  Location: [facility];  Service: Urology;  Laterality: N/A;  . CYSTOSCOPY W/ RETROGRADES Right 06/03/2015   Procedure: CYSTOSCOPY WITH RIGHT RETROGRADE PYELOGRAM CYSTOGRAM;  Surgeon: Rana Snare, MD;  Location: [facility];  Service: Urology;  Laterality: Right;  . CYSTOSCOPY WITH URETHRAL DILATATION N/A 07/10/2013   Procedure: CYSTOSCOPY WITH URETHRAL DILATATION, BALLOON DILITATION, RIGHT  RETROGRADE;  Surgeon: Bernestine Amass, MD;  Location: [facility];  Service: Urology;  Laterality: N/A;  . CYSTOSCOPY WITH URETHRAL DILATATION N/A 07/02/2014   Procedure: CYSTOSCOPY WITH URETHRAL DILATATION;  Surgeon: Rana Snare, MD;  Location: [facility];  Service: Urology;  Laterality: N/A;  . INSERTION OF SUPRAPUBIC CATHETER N/A 06/03/2015   Procedure: INSERTION OF  SUPRAPUBIC CATHETER;  Surgeon: Rana Snare, MD;  Location: [facility];  Service: Urology;  Laterality: N/A;     reports that he has never smoked. He has never used smokeless tobacco. He reports that he does not drink alcohol or use drugs.  No Known Allergies  Family History  Problem Relation Age of Onset  . Hypertension Mother   .  Hyperlipidemia Mother   . Cancer Father    Family history reviewed with Mother.   Prior to Admission medications   Medication Sig Start Date End Date Taking? Authorizing Provider  aspirin EC 81 MG tablet Take 81 mg by mouth daily.    Yes Historical Provider, MD  docusate sodium (COLACE) 100 MG capsule Take 100 mg by mouth at bedtime.    Yes Historical Provider, MD  pantoprazole (PROTONIX) 40 MG tablet Take 1 Tablet by mouth every morning 04/18/16  Yes Timmothy Euler, MD  simvastatin (ZOCOR) 40 MG tablet Take 1 Tablet by mouth once daily AT 6pm Patient taking differently: Take 40 mg by mouth at bedtime.  05/12/16  Yes Fransisca Kaufmann Dettinger, MD  tamsulosin (FLOMAX) 0.4 MG CAPS capsule Take 1 capsule (0.4 mg total) by mouth every morning. 05/12/16  Yes Fransisca Kaufmann Dettinger, MD    Physical Exam: Vitals:   05/20/16 3300 05/20/16 7622 05/20/16 0833 05/20/16 0917  BP: 127/71 136/90 119/62 113/65  Pulse: 93 100 (!) 125 (!) 124  Resp: 16 18 (!) 25 20  Temp: 98.9 F (37.2 C) 98.9 F (37.2 C) 98.9 F (37.2 C) 99.2 F (37.3 C)  TempSrc: Oral Oral Oral Oral  SpO2: 94% 96% 95% 99%  Weight:    79.2 kg (174 lb 9.7 oz)    Constitutional: NAD, calm, comfortable Vitals:   05/20/16 0509 05/20/16 0643 05/20/16 0833 05/20/16 0917  BP: 127/71 136/90 119/62 113/65  Pulse: 93 100 (!) 125 (!) 124  Resp: 16 18 (!) 25 20  Temp: 98.9 F (37.2 C) 98.9 F (37.2 C) 98.9 F (37.2 C) 99.2 F (37.3 C)  TempSrc: Oral Oral Oral Oral  SpO2: 94% 96% 95% 99%  Weight:    79.2 kg (174 lb 9.7 oz)   Eyes: PERRL, lids and conjunctivae normal ENMT: Mucous membranes are moist. Posterior pharynx clear of any exudate or lesions.Normal dentition.  Neck: normal, supple, no masses, no thyromegaly Respiratory: clear to auscultation bilaterally, no wheezing, no crackles. Normal respiratory effort. No accessory muscle use.  Cardiovascular: Regular rate and rhythm, no murmurs / rubs / gallops. No extremity edema. 2+ pedal  pulses. No carotid bruits.  Abdomen: no tenderness, no masses palpated. No hepatosplenomegaly. Bowel sounds positive.  Musculoskeletal: no clubbing / cyanosis. No joint deformity upper and lower extremities. Good ROM, no contractures. Normal muscle tone.  Skin: no rashes, lesions, ulcers. No induration Neurologic: has down's syndrome, able to answer simple questions, able to move all extremities.   Labs on Admission: I have personally reviewed following labs and imaging studies  CBC:  Recent Labs Lab 05/20/16 0239  WBC 10.3  NEUTROABS 8.6*  HGB 12.9*  HCT 37.4*  MCV 96.6  PLT 633   Basic Metabolic Panel:  Recent Labs Lab 05/20/16 0423  NA 139  K 3.8  CL 109  CO2 23  GLUCOSE 133*  BUN 20  CREATININE 2.16*  CALCIUM 8.0*   GFR: Estimated Creatinine Clearance: 34.8 mL/min (A) (by C-G formula based on SCr of 2.16 mg/dL (H)). Liver Function Tests:  Recent Labs Lab 05/20/16 0423  AST 15  ALT 11*  ALKPHOS 59  BILITOT 0.8  PROT 6.9  ALBUMIN 2.7*    Recent Labs Lab 05/20/16 0423  LIPASE 14   No results for input(s): AMMONIA in the last 168 hours. Coagulation Profile: No results for input(s): INR, PROTIME in the last 168 hours. Cardiac Enzymes: No results for input(s): CKTOTAL, CKMB, CKMBINDEX, TROPONINI in the last 168 hours. BNP (last 3 results) No results for input(s): PROBNP in the last 8760 hours. HbA1C: No results for input(s): HGBA1C in the last 72 hours. CBG: No results for input(s): GLUCAP in the last 168 hours. Lipid Profile: No results for input(s): CHOL, HDL, LDLCALC, TRIG, CHOLHDL, LDLDIRECT in the last 72 hours. Thyroid Function Tests: No results for input(s): TSH, T4TOTAL, FREET4, T3FREE, THYROIDAB in the last 72 hours. Anemia Panel: No results for input(s): VITAMINB12, FOLATE, FERRITIN, TIBC, IRON, RETICCTPCT in the last 72 hours. Urine analysis:    Component Value Date/Time   COLORURINE YELLOW 05/20/2016 0348   APPEARANCEUR CLOUDY (A)  05/20/2016 0348   APPEARANCEUR Clear 08/14/2015 1613   LABSPEC 1.008 05/20/2016 0348   PHURINE 8.0 05/20/2016 0348   GLUCOSEU NEGATIVE 05/20/2016 0348   HGBUR SMALL (A) 05/20/2016 0348   BILIRUBINUR NEGATIVE 05/20/2016 0348   BILIRUBINUR Negative 08/14/2015 1613   KETONESUR NEGATIVE 05/20/2016 0348   PROTEINUR 100 (A) 05/20/2016 0348   UROBILINOGEN negative 09/25/2013 1031   UROBILINOGEN 0.2 05/04/2012 1225   NITRITE POSITIVE (A) 05/20/2016 0348   LEUKOCYTESUR LARGE (A) 05/20/2016 0348   LEUKOCYTESUR 3+ (A) 08/14/2015 1613    Radiological Exams on Admission: Ct Abdomen Pelvis Wo Contrast  Result Date: 05/20/2016 CLINICAL DATA:  Right lower quadrant pain for several hours. Nausea. White cell count 10.3. Low GFR. History of Down's syndrome and bilateral hydronephrosis. Chronic cystitis. Urethral stricture. Chronic kidney disease. Gastroesophageal reflux disease. Meckel's diverticulum. Neurogenic bladder. EXAM: CT ABDOMEN AND PELVIS WITHOUT CONTRAST TECHNIQUE: Multidetector CT imaging of the abdomen and pelvis was performed following the standard protocol without IV contrast. COMPARISON:  05/04/2012 FINDINGS: Lower chest: Mild dependent atelectasis in the lung bases. Hepatobiliary: Tiny stones layering in the gallbladder. No gallbladder wall thickening or edema. No bile duct dilatation. No focal liver lesions. Pancreas: Fatty infiltration of the pancreas. No acute inflammatory changes. No ductal dilatation. Spleen: Normal in size without focal abnormality. Adrenals/Urinary Tract: No adrenal gland nodules. Prominent bilateral hydronephrosis and hydroureter down to the level of the bladder. Renal atrophy. Thickening of the wall of the ureter, particularly on the right, with stranding suggesting infection. Suprapubic catheter in the bladder. Prominent diffuse bladder wall thickening suggesting cystitis or bladder outlet obstruction. Increased density of the bladder may indicate concentrated urine or  hemorrhage. Correlation with urinalysis is recommended. Bladder diverticulum is decompressed since previous study. Small fat herniation in the suprapubic catheter insertion site. Stomach/Bowel: Stomach and small bowel are unremarkable. There is a ventral abdominal wall hernia containing a loop of small bowel but without proximal obstruction. Scattered stool throughout the colon. No colonic distention or wall thickening. Appendix is normal. Vascular/Lymphatic: Normal caliber abdominal aorta and inferior vena cava. Retroperitoneal lymph nodes are present without pathologic enlargement. Reproductive: Prostate is unremarkable. Other: Small left inguinal hernia containing fat. No free air or free fluid in the abdomen. Musculoskeletal: Degenerative changes in the spine. No destructive bone lesions. IMPRESSION: Prominent hydronephrosis and hydroureter bilaterally. Ureteral wall thickening and stranding particularly on the right suggesting pyelitis. Bladder is decompressed with a suprapubic catheter, but there is evidence of diffuse bladder wall  thickening. Increased density of the urine may indicate concentrated urine or hemorrhage. Correlation with urinalysis is recommended. Ventral abdominal wall hernia containing small bowel but without proximal obstruction. Left inguinal hernia containing fat. Small fat herniation at the suprapubic catheter insertion site. Electronically Signed   By: Lucienne Capers M.D.   On: 05/20/2016 05:56    EKG: not done.   Assessment/Plan Active Problems:   Pyelonephritis  Acute Pyelonephritis:  Admitted for IV antibiotics, IV fluids.  Urine cultures sent and supra pubic catheter replaced by EDP.  Called Dr Cy Blamer office and notified of the patient's admission.    h/o Down's syndrome and mental retardation: no agitation and he appears to be at baseline.    Acute on CKD stage 2 : worsening of creatinine, suspect dehydration and infection.  Hydrate and repeat renal parameters  in am.  His CT abd does is  similar in appearence when compared to the one in 2014.  Mother at bedside, reports that they have an appointment with nephrologist int he first week of April.   Mild normocytic anemia.  Monitor counts.   DVT prophylaxis: lovenox.  Code Status:  Family Communication: grandmother at bedside.  Disposition Plan: pending further eval.  Consults called: none Admission status: inpatient/ medsurg.   Hosie Poisson MD [facility] Pager [phone]  If 7PM-7AM, please contact night-coverage [email or web address] Password [facility]  05/20/2016, 9:26 AM

## 2016-05-20 NOTE — Progress Notes (Signed)
Pharmacy Antibiotic Note  Frank Levine is a 56 y.o. male admitted on 05/20/2016 with pyelonephritis. Pt has  a significant hx of chronic cystitis, chronic supra pubic catheter in use, down's syndrome, stage 2 CKD. h/o mental retardation, presents with abd pain. Pharmacy has been consulted for zosyn dosing.  Plan: Zosyn 3.375g IV Q8H infused over 4hrs. Follow renal function   Weight: 174 lb 9.7 oz (79.2 kg)  Temp (24hrs), Avg:99.1 F (37.3 C), Min:98.9 F (37.2 C), Max:99.3 F (37.4 C)   Recent Labs Lab 05/20/16 0239 05/20/16 0423  WBC 10.3  --   CREATININE  --  2.16*    Estimated Creatinine Clearance: 34.8 mL/min (A) (by C-G formula based on SCr of 2.16 mg/dL (H)).    No Known Allergies  Antimicrobials this admission: 3/16 ceftriaxone x1 3/16 zosyn >>  Microbiology results: 3/16 UCx: sent   Thank you for allowing pharmacy to be a part of this patient's care.  Dolly Rias RPh 05/20/2016, 11:05 AM Pager [phone]

## 2016-05-20 NOTE — ED Notes (Signed)
Bed: XA12 Expected date: 05/20/16 Expected time:  Means of arrival: Ambulance Comments: EMS-Nausea

## 2016-05-20 NOTE — ED Notes (Signed)
Pt In  Ct

## 2016-05-21 DIAGNOSIS — K219 Gastro-esophageal reflux disease without esophagitis: Secondary | ICD-10-CM

## 2016-05-21 DIAGNOSIS — B965 Pseudomonas (aeruginosa) (mallei) (pseudomallei) as the cause of diseases classified elsewhere: Secondary | ICD-10-CM

## 2016-05-21 DIAGNOSIS — E785 Hyperlipidemia, unspecified: Secondary | ICD-10-CM

## 2016-05-21 DIAGNOSIS — R1031 Right lower quadrant pain: Secondary | ICD-10-CM

## 2016-05-21 DIAGNOSIS — N182 Chronic kidney disease, stage 2 (mild): Secondary | ICD-10-CM

## 2016-05-21 DIAGNOSIS — N179 Acute kidney failure, unspecified: Secondary | ICD-10-CM

## 2016-05-21 DIAGNOSIS — E782 Mixed hyperlipidemia: Secondary | ICD-10-CM

## 2016-05-21 DIAGNOSIS — A498 Other bacterial infections of unspecified site: Secondary | ICD-10-CM

## 2016-05-21 DIAGNOSIS — Q909 Down syndrome, unspecified: Secondary | ICD-10-CM

## 2016-05-21 DIAGNOSIS — A4152 Sepsis due to Pseudomonas: Secondary | ICD-10-CM

## 2016-05-21 DIAGNOSIS — N390 Urinary tract infection, site not specified: Secondary | ICD-10-CM

## 2016-05-21 LAB — COMPREHENSIVE METABOLIC PANEL
ALBUMIN: 2.1 g/dL — AB (ref 3.5–5.0)
ALT: 11 U/L — ABNORMAL LOW (ref 17–63)
ANION GAP: 3 — AB (ref 5–15)
AST: 18 U/L (ref 15–41)
Alkaline Phosphatase: 59 U/L (ref 38–126)
BILIRUBIN TOTAL: 0.5 mg/dL (ref 0.3–1.2)
BUN: 17 mg/dL (ref 6–20)
CHLORIDE: 114 mmol/L — AB (ref 101–111)
CO2: 22 mmol/L (ref 22–32)
Calcium: 7.6 mg/dL — ABNORMAL LOW (ref 8.9–10.3)
Creatinine, Ser: 2.3 mg/dL — ABNORMAL HIGH (ref 0.61–1.24)
GFR calc Af Amer: 35 mL/min — ABNORMAL LOW (ref >60)
GFR calc non Af Amer: 30 mL/min — ABNORMAL LOW (ref >60)
GLUCOSE: 133 mg/dL — AB (ref 65–99)
POTASSIUM: 4.1 mmol/L (ref 3.5–5.1)
SODIUM: 139 mmol/L (ref 135–145)
TOTAL PROTEIN: 6 g/dL — AB (ref 6.5–8.1)

## 2016-05-21 LAB — CBC WITH DIFFERENTIAL/PLATELET
BASOS PCT: 0 %
Basophils Absolute: 0 10*3/uL (ref 0.0–0.1)
EOS ABS: 0 10*3/uL (ref 0.0–0.7)
Eosinophils Relative: 0 %
HEMATOCRIT: 31.9 % — AB (ref 39.0–52.0)
Hemoglobin: 10.7 g/dL — ABNORMAL LOW (ref 13.0–17.0)
LYMPHS PCT: 2 %
Lymphs Abs: 0.7 10*3/uL (ref 0.7–4.0)
MCH: 33.4 pg (ref 26.0–34.0)
MCHC: 33.5 g/dL (ref 30.0–36.0)
MCV: 99.7 fL (ref 78.0–100.0)
MONO ABS: 1.6 10*3/uL — AB (ref 0.1–1.0)
Monocytes Relative: 5 %
NEUTROS PCT: 93 %
Neutro Abs: 30.2 10*3/uL — ABNORMAL HIGH (ref 1.7–7.7)
PLATELETS: 198 10*3/uL (ref 150–400)
RBC: 3.2 MIL/uL — AB (ref 4.22–5.81)
RDW: 15 % (ref 11.5–15.5)
WBC MORPHOLOGY: INCREASED
WBC: 32.5 10*3/uL — AB (ref 4.0–10.5)

## 2016-05-21 LAB — PROCALCITONIN: PROCALCITONIN: 1.85 ng/mL

## 2016-05-21 LAB — MAGNESIUM: Magnesium: 1.9 mg/dL (ref 1.7–2.4)

## 2016-05-21 LAB — LACTIC ACID, PLASMA
LACTIC ACID, VENOUS: 1.2 mmol/L (ref 0.5–1.9)
LACTIC ACID, VENOUS: 1.1 mmol/L (ref 0.5–1.9)

## 2016-05-21 LAB — PHOSPHORUS: Phosphorus: 2.5 mg/dL (ref 2.5–4.6)

## 2016-05-21 LAB — HIV ANTIBODY (ROUTINE TESTING W REFLEX): HIV SCREEN 4TH GENERATION: NONREACTIVE

## 2016-05-21 MED ORDER — SODIUM CHLORIDE 0.9 % IV BOLUS (SEPSIS)
1000.0000 mL | Freq: Once | INTRAVENOUS | Status: AC
  Administered 2016-05-21: 1000 mL via INTRAVENOUS

## 2016-05-21 MED ORDER — VANCOMYCIN HCL IN DEXTROSE 1-5 GM/200ML-% IV SOLN
1000.0000 mg | INTRAVENOUS | Status: DC
  Administered 2016-05-21 – 2016-05-22 (×2): 1000 mg via INTRAVENOUS
  Filled 2016-05-21 (×2): qty 200

## 2016-05-21 NOTE — Progress Notes (Signed)
PROGRESS NOTE    Frank Levine  LDJ:[id number] DOB: 01-24-1961 DOA: 05/20/2016 PCP: Fransisca Kaufmann Dettinger, MD  Brief Narrative:  Frank Levine is a 56 y.o. male with medical history significant of chronic cystitis, chronic supra pubic catheter in use, down's syndrome, stage 2 CKD, h/o cognitive delay and h/o DVT and PE, not on anticoagulation, bilateral hydronephrosis, presents from home with abdominal pain. On arrival to ED, he was found to have low grade fever, borderline bp parameters and labs significant for creatinine of 2.16, UA positive for leukocytes and nitrates, mucous and multiple bacteria. He also underwent CT abd and pelvis showing bilateral hydronephrosis, diffuse bladder thickening, increased density of the urine, consistent with pyelonephritis. He was referred to medical service for evaluation and management of pyelonephritis. He received a dose of rocephin in ED for the infection. Overnight he became septic and was bolused 3 liters and had IV Abx broadened. Urology was consulted and recommended continued medical management. Patient still with fever and WBC increased to 32.5. Patient states Abdominal Pain is improved.   Assessment & Plan:   Active Problems:   Down's syndrome   GERD (gastroesophageal reflux disease)   Hyperlipidemia   Chronic kidney disease (CKD), stage II (mild)   Pyelonephritis   Acute renal failure (ARF) (HCC)  Sepsis 2/2 to Right Sided Pyelonephritis complicated by prominent Hydronephrosis and Hydroureter bilaterally in the setting of Neurogenic Bladder and BPH/Chronic Urethral Stricture -Patient was Febrile, Tachycardic, and Tachypenic and WBC went from 10.3 -> 32.5 -CT Abd Showed Prominent hydronephrosis and hydroureter bilaterally. Ureteral wall thickening and stranding particularly on the right suggesting pyelitis. Bladder is decompressed with a suprapubic catheter, but there is evidence of diffuse bladder wall thickening. Increased density of the urine may  indicate concentrated urine or hemorrhage.  -S/p Bolus 4 Liters, C/w Maintenance Fluid at 125 mL/hr -Urinalysis showed Cloudy Urine with Many Bacteria, Large Leukocytes, Positive Nitrites, TNTC WBC and TNTC RBC -Urine Cx shows >100,000 of Pseudomonas Aeruginosa with Sensitives Pending -C/w Broad Spectrum Abx with IV Zosyn and IV Vancomycin -Consulted Urology and Appreciate Recc's -They do not feel as if he needs a Nephrostomy tube at this Point and recommending continuing Abx -Suprapubic Catheter replaced by EDP; C/w Tamsulosin 0.4 mg po every morning -Will Check Procalcitonin; Lactic Acid Level was 1.2 but will repeat Lactic Levels -Patient states Abdominal Pain has improved; Pain control with Tramadol 100 mg po q6hprn and Morphine 1-2 mg IV q4hprn for Severe Pain -C/w Acetaminophen 650 mg po q4hprn for Fever, headache, mild pain  Down's Syndrome and Cognitive Delay -No agitation and he appears to be at baseline.   Acute on CKD stage 2  -Worsening of creatinine, suspect dehydration and infection.  -BUN/Cr went from 20/2.16 -> 17/2.30 -C/w IVF Rehydration at 125 mL/hr -Avoid Nephrotoxic Medications -Repeat CMP in AM  Mild Normocytic Anemia.  -Patient's Hb/Hct went from 12.9/37.4 -> 10.7/31.9 -Repeat Cbc in AM  GERD -C/w Pantoprazole 40 mg po Daily  Hx of DVT and PE -Not on Anticoagulation -No Active Issues; C/w AS 81 mg po Daily  Hyperlipidemia -C/w Simvastatin 40 mg po Daily qHS  DVT prophylaxis: Lovenox 40 mg sq q24h Code Status: FULL CODE Family Communication: Discussed with Mother at bedside Disposition Plan: Pending Medical Stabilization and PT evaluation   Consultants:   Urology   Procedures: None   Antimicrobials:  Anti-infectives    Start     Dose/Rate Route Frequency Ordered Stop   05/21/16 2200  vancomycin (VANCOCIN) IVPB 1000  mg/200 mL premix     1,000 mg 200 mL/hr over 60 Minutes Intravenous Every 24 hours 05/21/16 0309     05/20/16 2330   vancomycin (VANCOCIN) IVPB 1000 mg/200 mL premix     1,000 mg 200 mL/hr over 60 Minutes Intravenous  Once 05/20/16 2316 05/21/16 0052   05/20/16 1000  piperacillin-tazobactam (ZOSYN) IVPB 3.375 g     3.375 g 12.5 mL/hr over 240 Minutes Intravenous Every 8 hours 05/20/16 0939     05/20/16 0615  cefTRIAXone (ROCEPHIN) 1 g in dextrose 5 % 50 mL IVPB     1 g 100 mL/hr over 30 Minutes Intravenous  Once 05/20/16 8786 05/20/16 0831     Subjective: Seen and examined at bedside and stated he had no more abdominal pain. No Nausea or vomiting. Still had a slight fever. States he is better than yesterday. No other concerns or complaints at this time.   Objective: Vitals:   05/21/16 1053 05/21/16 1239 05/21/16 1530 05/21/16 1639  BP: (!) 88/50 (!) 97/57 (!) 87/60 (!) 93/56  Pulse:  (!) 106 (!) 108 (!) 112  Resp:  20 18 18   Temp:  98.7 F (37.1 C) 98.2 F (36.8 C) (!) 100.4 F (38 C)  TempSrc:  Oral Oral Oral  SpO2:  98% 97% 97%  Weight:        Intake/Output Summary (Last 24 hours) at 05/21/16 1844 Last data filed at 05/21/16 1200  Gross per 24 hour  Intake           1537.5 ml  Output             2425 ml  Net           -887.5 ml   Filed Weights   05/20/16 0917  Weight: 79.2 kg (174 lb 9.7 oz)   Examination: Physical Exam:  Constitutional:  NAD and appears calm and comfortable Eyes: Lids and conjunctivae normal, sclerae anicteric  ENMT: External Ears normal. Grossly normal hearing. Down Syndrome features Neck: Appears normal, supple, no cervical masses, normal ROM, no appreciable thyromegaly Respiratory: Clear to auscultation bilaterally, no wheezing, rales, rhonchi or crackles. Normal respiratory effort and patient is not tachypenic. No accessory muscle use.  Cardiovascular: Tachycardic Rate and Rhythm, no murmurs / rubs / gallops. S1 and S2 auscultated. No extremity edema.   Abdomen: Soft, non-tender, non-distended. No masses palpated. No appreciable hepatosplenomegaly. Bowel  sounds positive.  GU: Deferred. Suprapubic Catheter in place draining yellow urine.  Musculoskeletal: No clubbing / cyanosis of digits/nails. No joint deformity upper and lower extremities.  Skin: No rashes, lesions, ulcers. No induration; Warm and dry.  Neurologic: CN 2-12 grossly intact with no focal deficits. Sensation intact in all 4 Extremities. Romberg sign cerebellar reflexes not assessed.  Psychiatric: Normal judgment and insight. Alert and awake. Normal mood and appropriate affect.   Data Reviewed: I have personally reviewed following labs and imaging studies  CBC:  Recent Labs Lab 05/20/16 0239 05/21/16 1047  WBC 10.3 32.5*  NEUTROABS 8.6* 30.2*  HGB 12.9* 10.7*  HCT 37.4* 31.9*  MCV 96.6 99.7  PLT 294 767   Basic Metabolic Panel:  Recent Labs Lab 05/20/16 0423 05/21/16 1047  NA 139 139  K 3.8 4.1  CL 109 114*  CO2 23 22  GLUCOSE 133* 133*  BUN 20 17  CREATININE 2.16* 2.30*  CALCIUM 8.0* 7.6*  MG  --  1.9  PHOS  --  2.5   GFR: Estimated Creatinine Clearance: 32.7 mL/min (A) (by  C-G formula based on SCr of 2.3 mg/dL (H)). Liver Function Tests:  Recent Labs Lab 05/20/16 0423 05/21/16 1047  AST 15 18  ALT 11* 11*  ALKPHOS 59 59  BILITOT 0.8 0.5  PROT 6.9 6.0*  ALBUMIN 2.7* 2.1*    Recent Labs Lab 05/20/16 0423  LIPASE 14   No results for input(s): AMMONIA in the last 168 hours. Coagulation Profile: No results for input(s): INR, PROTIME in the last 168 hours. Cardiac Enzymes: No results for input(s): CKTOTAL, CKMB, CKMBINDEX, TROPONINI in the last 168 hours. BNP (last 3 results) No results for input(s): PROBNP in the last 8760 hours. HbA1C: No results for input(s): HGBA1C in the last 72 hours. CBG: No results for input(s): GLUCAP in the last 168 hours. Lipid Profile: No results for input(s): CHOL, HDL, LDLCALC, TRIG, CHOLHDL, LDLDIRECT in the last 72 hours. Thyroid Function Tests: No results for input(s): TSH, T4TOTAL, FREET4, T3FREE,  THYROIDAB in the last 72 hours. Anemia Panel: No results for input(s): VITAMINB12, FOLATE, FERRITIN, TIBC, IRON, RETICCTPCT in the last 72 hours. Sepsis Labs:  Recent Labs Lab 05/20/16 1024 05/20/16 2225  LATICACIDVEN 1.5 1.2    Recent Results (from the past 240 hour(s))  Urine culture     Status: Abnormal (Preliminary result)   Collection Time: 05/20/16  3:48 AM  Result Value Ref Range Status   Specimen Description URINE, CLEAN CATCH  Final   Special Requests NONE  Final   Culture (A)  Final    >=100,000 COLONIES/mL PSEUDOMONAS AERUGINOSA CULTURE REINCUBATED FOR BETTER GROWTH Performed at [facility], [place] [address] [place], [place] [address]    Report Status PENDING  Incomplete    Radiology Studies: Ct Abdomen Pelvis Wo Contrast  Result Date: 05/20/2016 CLINICAL DATA:  Right lower quadrant pain for several hours. Nausea. White cell count 10.3. Low GFR. History of Down's syndrome and bilateral hydronephrosis. Chronic cystitis. Urethral stricture. Chronic kidney disease. Gastroesophageal reflux disease. Meckel's diverticulum. Neurogenic bladder. EXAM: CT ABDOMEN AND PELVIS WITHOUT CONTRAST TECHNIQUE: Multidetector CT imaging of the abdomen and pelvis was performed following the standard protocol without IV contrast. COMPARISON:  05/04/2012 FINDINGS: Lower chest: Mild dependent atelectasis in the lung bases. Hepatobiliary: Tiny stones layering in the gallbladder. No gallbladder wall thickening or edema. No bile duct dilatation. No focal liver lesions. Pancreas: Fatty infiltration of the pancreas. No acute inflammatory changes. No ductal dilatation. Spleen: Normal in size without focal abnormality. Adrenals/Urinary Tract: No adrenal gland nodules. Prominent bilateral hydronephrosis and hydroureter down to the level of the bladder. Renal atrophy. Thickening of the wall of the ureter, particularly on the right, with stranding suggesting infection. Suprapubic catheter in the bladder.  Prominent diffuse bladder wall thickening suggesting cystitis or bladder outlet obstruction. Increased density of the bladder may indicate concentrated urine or hemorrhage. Correlation with urinalysis is recommended. Bladder diverticulum is decompressed since previous study. Small fat herniation in the suprapubic catheter insertion site. Stomach/Bowel: Stomach and small bowel are unremarkable. There is a ventral abdominal wall hernia containing a loop of small bowel but without proximal obstruction. Scattered stool throughout the colon. No colonic distention or wall thickening. Appendix is normal. Vascular/Lymphatic: Normal caliber abdominal aorta and inferior vena cava. Retroperitoneal lymph nodes are present without pathologic enlargement. Reproductive: Prostate is unremarkable. Other: Small left inguinal hernia containing fat. No free air or free fluid in the abdomen. Musculoskeletal: Degenerative changes in the spine. No destructive bone lesions. IMPRESSION: Prominent hydronephrosis and hydroureter bilaterally. Ureteral wall thickening and stranding particularly on  the right suggesting pyelitis. Bladder is decompressed with a suprapubic catheter, but there is evidence of diffuse bladder wall thickening. Increased density of the urine may indicate concentrated urine or hemorrhage. Correlation with urinalysis is recommended. Ventral abdominal wall hernia containing small bowel but without proximal obstruction. Left inguinal hernia containing fat. Small fat herniation at the suprapubic catheter insertion site. Electronically Signed   By: Lucienne Capers M.D.   On: 05/20/2016 05:56   Scheduled Meds: . aspirin EC  81 mg Oral Daily  . docusate sodium  100 mg Oral QHS  . enoxaparin (LOVENOX) injection  40 mg Subcutaneous Q24H  . pantoprazole  40 mg Oral q morning - 10a  . piperacillin-tazobactam (ZOSYN)  IV  3.375 g Intravenous Q8H  . simvastatin  40 mg Oral QHS  . tamsulosin  0.4 mg Oral q morning - 10a  .  vancomycin  1,000 mg Intravenous Q24H   Continuous Infusions: . sodium chloride 125 mL/hr at 05/21/16 1724    LOS: 1 day   Kerney Elbe, DO [facility] Pager [phone]  If 7PM-7AM, please contact night-coverage [email or web address] Password [email or web address] 05/21/2016, 6:44 PM

## 2016-05-21 NOTE — Progress Notes (Signed)
Subjective: Patient reports improvement in right abdmominal pain. No fevers this morning. He is currentl on Vanc and zosyn. Hypotension improveing  Objective: Vital signs in last 24 hours: Temp:  [97.9 F (36.6 C)-101 F (38.3 C)] 99.8 F (37.7 C) (03/17 0952) Pulse Rate:  [97-105] 104 (03/17 0952) Resp:  [16-22] 22 (03/17 0952) BP: (80-97)/(47-56) 88/50 (03/17 1053) SpO2:  [92 %-97 %] 97 % (03/17 0952)  Intake/Output from previous day: 03/16 0701 - 03/17 0700 In: 1587.5 [P.O.:300; I.V.:1237.5; IV Piggyback:50] Out: 2700 [Urine:2700] Intake/Output this shift: Total I/O In: 240 [P.O.:240] Out: -   Physical Exam:  General:alert, cooperative and appears stated age GI: soft, non tender, normal bowel sounds, no palpable masses, no organomegaly, no inguinal hernia Male genitalia: not done Extremities: extremities normal, atraumatic, no cyanosis or edema  Lab Results:  Recent Labs  05/20/16 0239 05/21/16 1047  HGB 12.9* 10.7*  HCT 37.4* 31.9*   BMET  Recent Labs  05/20/16 0423 05/21/16 1047  NA 139 139  K 3.8 4.1  CL 109 114*  CO2 23 22  GLUCOSE 133* 133*  BUN 20 17  CREATININE 2.16* 2.30*  CALCIUM 8.0* 7.6*   No results for input(s): LABPT, INR in the last 72 hours. No results for input(s): LABURIN in the last 72 hours. Results for orders placed or performed during the hospital encounter of 05/20/16  Urine culture     Status: Abnormal (Preliminary result)   Collection Time: 05/20/16  3:48 AM  Result Value Ref Range Status   Specimen Description URINE, CLEAN CATCH  Final   Special Requests NONE  Final   Culture (A)  Final    >=100,000 COLONIES/mL PSEUDOMONAS AERUGINOSA CULTURE REINCUBATED FOR BETTER GROWTH Performed at [facility], [address], [place], [place] [id number]    Report Status PENDING  Incomplete    Studies/Results: Ct Abdomen Pelvis Wo Contrast  Result Date: 05/20/2016 CLINICAL DATA:  Right lower quadrant pain for several  hours. Nausea. White cell count 10.3. Low GFR. History of Down's syndrome and bilateral hydronephrosis. Chronic cystitis. Urethral stricture. Chronic kidney disease. Gastroesophageal reflux disease. Meckel's diverticulum. Neurogenic bladder. EXAM: CT ABDOMEN AND PELVIS WITHOUT CONTRAST TECHNIQUE: Multidetector CT imaging of the abdomen and pelvis was performed following the standard protocol without IV contrast. COMPARISON:  05/04/2012 FINDINGS: Lower chest: Mild dependent atelectasis in the lung bases. Hepatobiliary: Tiny stones layering in the gallbladder. No gallbladder wall thickening or edema. No bile duct dilatation. No focal liver lesions. Pancreas: Fatty infiltration of the pancreas. No acute inflammatory changes. No ductal dilatation. Spleen: Normal in size without focal abnormality. Adrenals/Urinary Tract: No adrenal gland nodules. Prominent bilateral hydronephrosis and hydroureter down to the level of the bladder. Renal atrophy. Thickening of the wall of the ureter, particularly on the right, with stranding suggesting infection. Suprapubic catheter in the bladder. Prominent diffuse bladder wall thickening suggesting cystitis or bladder outlet obstruction. Increased density of the bladder may indicate concentrated urine or hemorrhage. Correlation with urinalysis is recommended. Bladder diverticulum is decompressed since previous study. Small fat herniation in the suprapubic catheter insertion site. Stomach/Bowel: Stomach and small bowel are unremarkable. There is a ventral abdominal wall hernia containing a loop of small bowel but without proximal obstruction. Scattered stool throughout the colon. No colonic distention or wall thickening. Appendix is normal. Vascular/Lymphatic: Normal caliber abdominal aorta and inferior vena cava. Retroperitoneal lymph nodes are present without pathologic enlargement. Reproductive: Prostate is unremarkable. Other: Small left inguinal hernia containing fat. No free air or  free fluid in the abdomen. Musculoskeletal: Degenerative changes in the spine. No destructive bone lesions. IMPRESSION: Prominent hydronephrosis and hydroureter bilaterally. Ureteral wall thickening and stranding particularly on the right suggesting pyelitis. Bladder is decompressed with a suprapubic catheter, but there is evidence of diffuse bladder wall thickening. Increased density of the urine may indicate concentrated urine or hemorrhage. Correlation with urinalysis is recommended. Ventral abdominal wall hernia containing small bowel but without proximal obstruction. Left inguinal hernia containing fat. Small fat herniation at the suprapubic catheter insertion site. Electronically Signed   By: Lucienne Capers M.D.   On: 05/20/2016 05:56    Assessment/Plan: 55yo with bilateral hydronephrosis and sepsis from a urinary source  1. Continue broad spectrum antibiotics pending urine culture. No indication for nephrostomy tube drainage of his right kidney at this time. Urology to continue to follow   LOS: 1 day   Nicolette Bang 05/21/2016, 12:15 PM

## 2016-05-21 NOTE — Progress Notes (Addendum)
Last night, patient's BP was 80/49, HR 105, and temp 99.2, On call MD was notified, new orders given for a 2l bolus, and a lactic acid to be drawn, and for fluids to be changed to NS at 100. Also, MD called urology to come see patient. After 2l bolus was given, patient's BP came up to 97/49.

## 2016-05-21 NOTE — Progress Notes (Signed)
Pharmacy Antibiotic Note  Frank Levine is a 56 y.o. male admitted on 05/20/2016 with pyelonephritis .  Pharmacy has been consulted for vancomycin dosing.  Plan: Vancomycin 1gm IV every 24 hours.  Goal trough 15-20 mcg/mL.  Weight: 174 lb 9.7 oz (79.2 kg)  Temp (24hrs), Avg:99.4 F (37.4 C), Min:97.9 F (36.6 C), Max:101 F (38.3 C)   Recent Labs Lab 05/20/16 0239 05/20/16 0423 05/20/16 1024 05/20/16 2225  WBC 10.3  --   --   --   CREATININE  --  2.16*  --   --   LATICACIDVEN  --   --  1.5 1.2    Estimated Creatinine Clearance: 34.8 mL/min (A) (by C-G formula based on SCr of 2.16 mg/dL (H)).    No Known Allergies  Antimicrobials this admission: 3/16 ceftriaxone x1 3/16 zosyn >> 3/17 vancomycin >>  Dose adjustments this admission: -  Microbiology results: 3/16 UCx: sent   Thank you for allowing pharmacy to be a part of this patient's care.  Nani Skillern Crowford 05/21/2016 3:10 AM

## 2016-05-22 DIAGNOSIS — N10 Acute tubulo-interstitial nephritis: Secondary | ICD-10-CM

## 2016-05-22 LAB — CBC WITH DIFFERENTIAL/PLATELET
BASOS PCT: 0 %
Basophils Absolute: 0.1 10*3/uL (ref 0.0–0.1)
Eosinophils Absolute: 0.3 10*3/uL (ref 0.0–0.7)
Eosinophils Relative: 1 %
HEMATOCRIT: 30.9 % — AB (ref 39.0–52.0)
HEMOGLOBIN: 10.3 g/dL — AB (ref 13.0–17.0)
LYMPHS ABS: 0.7 10*3/uL (ref 0.7–4.0)
LYMPHS PCT: 3 %
MCH: 32.4 pg (ref 26.0–34.0)
MCHC: 33.3 g/dL (ref 30.0–36.0)
MCV: 97.2 fL (ref 78.0–100.0)
MONO ABS: 1 10*3/uL (ref 0.1–1.0)
MONOS PCT: 4 %
NEUTROS ABS: 22 10*3/uL — AB (ref 1.7–7.7)
NEUTROS PCT: 92 %
Platelets: 208 10*3/uL (ref 150–400)
RBC: 3.18 MIL/uL — ABNORMAL LOW (ref 4.22–5.81)
RDW: 15 % (ref 11.5–15.5)
WBC: 24.1 10*3/uL — ABNORMAL HIGH (ref 4.0–10.5)

## 2016-05-22 LAB — COMPREHENSIVE METABOLIC PANEL
ALBUMIN: 2.2 g/dL — AB (ref 3.5–5.0)
ALT: 10 U/L — ABNORMAL LOW (ref 17–63)
ANION GAP: 7 (ref 5–15)
AST: 13 U/L — ABNORMAL LOW (ref 15–41)
Alkaline Phosphatase: 58 U/L (ref 38–126)
BILIRUBIN TOTAL: 0.6 mg/dL (ref 0.3–1.2)
BUN: 17 mg/dL (ref 6–20)
CO2: 20 mmol/L — ABNORMAL LOW (ref 22–32)
Calcium: 7.8 mg/dL — ABNORMAL LOW (ref 8.9–10.3)
Chloride: 111 mmol/L (ref 101–111)
Creatinine, Ser: 2.2 mg/dL — ABNORMAL HIGH (ref 0.61–1.24)
GFR calc Af Amer: 37 mL/min — ABNORMAL LOW (ref >60)
GFR, EST NON AFRICAN AMERICAN: 32 mL/min — AB (ref >60)
GLUCOSE: 108 mg/dL — AB (ref 65–99)
Potassium: 3.9 mmol/L (ref 3.5–5.1)
Sodium: 138 mmol/L (ref 135–145)
Total Protein: 6 g/dL — ABNORMAL LOW (ref 6.5–8.1)

## 2016-05-22 LAB — PHOSPHORUS: Phosphorus: 2.4 mg/dL — ABNORMAL LOW (ref 2.5–4.6)

## 2016-05-22 LAB — MAGNESIUM: Magnesium: 1.8 mg/dL (ref 1.7–2.4)

## 2016-05-22 NOTE — Progress Notes (Signed)
PROGRESS NOTE    Frank Levine  GNF:[id number] DOB: [birth year] DOA: 05/20/2016 PCP: Fransisca Kaufmann Dettinger, MD  Brief Narrative:  Frank Levine is a 56 y.o. male with medical history significant of chronic cystitis, chronic supra pubic catheter in use, down's syndrome, stage 2 CKD, h/o cognitive delay and h/o DVT and PE, not on anticoagulation, bilateral hydronephrosis, presents from home with abdominal pain. On arrival to ED, he was found to have low grade fever, borderline bp parameters and labs significant for creatinine of 2.16, UA positive for leukocytes and nitrates, mucous and multiple bacteria. He also underwent CT abd and pelvis showing bilateral hydronephrosis, diffuse bladder thickening, increased density of the urine, consistent with pyelonephritis. He was referred to medical service for evaluation and management of pyelonephritis. He received a dose of rocephin in ED for the infection. Overnight he became septic and was bolused 3 liters and had IV Abx broadened. Urology was consulted and recommended continued medical management. Patient's WBC is improving and Fever is improving. Will continue current treatments and wean O2 as tolerated and decrease IVF to 100 mL/hr.   Assessment & Plan:   Active Problems:   Down's syndrome   GERD (gastroesophageal reflux disease)   Hyperlipidemia   Chronic kidney disease (CKD), stage II (mild)   Pyelonephritis   Acute renal failure (ARF) (HCC)  Sepsis 2/2 to Right Sided Pyelonephritis complicated by prominent Hydronephrosis and Hydroureter bilaterally in the setting of Neurogenic Bladder and BPH/Chronic Urethral Stricture -Patient was Febrile, Tachycardic, and Tachypenic and WBC went from 10.3 -> 32.5 -Sepsis Physiology improving -WBC went from 32.5 -> 24.1 -CT Abd Showed Prominent hydronephrosis and hydroureter bilaterally. Ureteral wall thickening and stranding particularly on the right suggesting pyelitis. Bladder is decompressed with a suprapubic  catheter, but there is evidence of diffuse bladder wall thickening. Increased density of the urine may indicate concentrated urine or hemorrhage.  -S/p Bolus 4 Liters, Decrease Maintenance Fluid at 125 mL/hr to 100 mL/hr -Urinalysis showed Cloudy Urine with Many Bacteria, Large Leukocytes, Positive Nitrites, TNTC WBC and TNTC RBC -Urine Cx shows >100,000 of Pseudomonas Aeruginosa with Sensitives Pending -C/w Broad Spectrum Abx with IV Zosyn and IV Vancomycin -Consulted Urology and Appreciate Recc's -They do not feel as if he needs a Nephrostomy tube at this Point and recommending continuing Abx -Suprapubic Catheter replaced by EDP; C/w Tamsulosin 0.4 mg po every morning -Procalcitonin was 1.85; Lactic Acid Level was 1.2 and repeat Lactic Acid Level was 1.1 -Patient states Abdominal Pain has improved; Pain control with Tramadol 100 mg po q6hprn and Morphine 1-2 mg IV q4hprn for Severe Pain -C/w Acetaminophen 650 mg po q4hprn for Fever, headache, mild pain -Will order PT/OT to Evaluate and Treat   Down's Syndrome and Cognitive Delay -No agitation and he appears to be at baseline.   Acute on CKD stage 2  -Worsening of creatinine, suspect dehydration and infection.  -BUN/Cr went from 20/2.16 -> 17/2.30 -> 17/2.20 -C/w IVF Rehydration at 100 mL/hr -Avoid Nephrotoxic Medications -Repeat CMP in AM  Mild Normocytic Anemia.  -Patient's Hb/Hct went from 12.9/37.4 -> 10.7/31.9 -> 10.3/30.9 -Repeat CBC in AM  GERD -C/w Pantoprazole 40 mg po Daily  Hx of DVT and PE -Not on Anticoagulation -No Active Issues; C/w AS 81 mg po Daily  Hyperlipidemia -C/w Simvastatin 40 mg po Daily qHS  DVT prophylaxis: Lovenox 40 mg sq q24h Code Status: FULL CODE Family Communication: Discussed with Mother and Family at bedside Disposition Plan: Pending Medical Stabilization and PT evaluation  Consultants:   Urology   Procedures: None   Antimicrobials:  Anti-infectives    Start     Dose/Rate  Route Frequency Ordered Stop   05/21/16 2200  vancomycin (VANCOCIN) IVPB 1000 mg/200 mL premix     1,000 mg 200 mL/hr over 60 Minutes Intravenous Every 24 hours 05/21/16 0309     05/20/16 2330  vancomycin (VANCOCIN) IVPB 1000 mg/200 mL premix     1,000 mg 200 mL/hr over 60 Minutes Intravenous  Once 05/20/16 2316 05/21/16 0052   05/20/16 1000  piperacillin-tazobactam (ZOSYN) IVPB 3.375 g     3.375 g 12.5 mL/hr over 240 Minutes Intravenous Every 8 hours 05/20/16 0939     05/20/16 0615  cefTRIAXone (ROCEPHIN) 1 g in dextrose 5 % 50 mL IVPB     1 g 100 mL/hr over 30 Minutes Intravenous  Once 05/20/16 1191 05/20/16 0831     Subjective: Seen and examined at bedside and he stated he felt good. Wanted to get out of bed and to the chair. No nausea or vomiting. Stated Nasal Cannula was bothering him. No other concerns or complaints.  Objective: Vitals:   05/21/16 1639 05/21/16 2321 05/22/16 0610 05/22/16 1543  BP: (!) 93/56 (!) 94/56 94/60 113/68  Pulse: (!) 112 (!) 102 (!) 108 (!) 104  Resp: 18 18 18 20   Temp: (!) 100.4 F (38 C) 99.2 F (37.3 C) 98.8 F (37.1 C) 99.3 F (37.4 C)  TempSrc: Oral Oral Oral Oral  SpO2: 97% 97% 97% 94%  Weight:        Intake/Output Summary (Last 24 hours) at 05/22/16 1709 Last data filed at 05/22/16 1500  Gross per 24 hour  Intake          4311.67 ml  Output             3600 ml  Net           711.67 ml   Filed Weights   05/20/16 0917  Weight: 79.2 kg (174 lb 9.7 oz)   Examination: Physical Exam:  Constitutional:  NAD and appears calm and comfortable Eyes: Lids and conjunctivae normal, sclerae anicteric  ENMT: External Ears normal. Grossly normal hearing. Down Syndrome features Neck: Appears normal, supple, no cervical masses, normal ROM, no appreciable thyromegaly Respiratory: Clear to auscultation bilaterally, no wheezing, rales, rhonchi or crackles. Normal respiratory effort and patient is not tachypenic. No accessory muscle use.    Cardiovascular: Tachycardic Rate and Rhythm, no murmurs / rubs / gallops. S1 and S2 auscultated. Mild extremity edema.   Abdomen: Soft, non-tender, non-distended. No masses palpated. No appreciable hepatosplenomegaly. Bowel sounds positive.  GU: Deferred. Suprapubic Catheter in place draining yellow urine.  Musculoskeletal: No clubbing / cyanosis of digits/nails. No joint deformity upper and lower extremities.  Skin: No rashes, lesions, ulcers. No induration; Warm and dry.  Neurologic: CN 2-12 grossly intact with no focal deficits. Sensation intact in all 4 Extremities. Romberg sign cerebellar reflexes not assessed.  Psychiatric: Normal judgment and insight. Alert and awake. Normal mood and appropriate affect.   Data Reviewed: I have personally reviewed following labs and imaging studies  CBC:  Recent Labs Lab 05/20/16 0239 05/21/16 1047 05/22/16 0517  WBC 10.3 32.5* 24.1*  NEUTROABS 8.6* 30.2* 22.0*  HGB 12.9* 10.7* 10.3*  HCT 37.4* 31.9* 30.9*  MCV 96.6 99.7 97.2  PLT 294 198 478   Basic Metabolic Panel:  Recent Labs Lab 05/20/16 0423 05/21/16 1047 05/22/16 0517  NA 139 139 138  K 3.8  4.1 3.9  CL 109 114* 111  CO2 23 22 20*  GLUCOSE 133* 133* 108*  BUN 20 17 17   CREATININE 2.16* 2.30* 2.20*  CALCIUM 8.0* 7.6* 7.8*  MG  --  1.9 1.8  PHOS  --  2.5 2.4*   GFR: Estimated Creatinine Clearance: 34.2 mL/min (A) (by C-G formula based on SCr of 2.2 mg/dL (H)). Liver Function Tests:  Recent Labs Lab 05/20/16 0423 05/21/16 1047 05/22/16 0517  AST 15 18 13*  ALT 11* 11* 10*  ALKPHOS 59 59 58  BILITOT 0.8 0.5 0.6  PROT 6.9 6.0* 6.0*  ALBUMIN 2.7* 2.1* 2.2*    Recent Labs Lab 05/20/16 0423  LIPASE 14   No results for input(s): AMMONIA in the last 168 hours. Coagulation Profile: No results for input(s): INR, PROTIME in the last 168 hours. Cardiac Enzymes: No results for input(s): CKTOTAL, CKMB, CKMBINDEX, TROPONINI in the last 168 hours. BNP (last 3  results) No results for input(s): PROBNP in the last 8760 hours. HbA1C: No results for input(s): HGBA1C in the last 72 hours. CBG: No results for input(s): GLUCAP in the last 168 hours. Lipid Profile: No results for input(s): CHOL, HDL, LDLCALC, TRIG, CHOLHDL, LDLDIRECT in the last 72 hours. Thyroid Function Tests: No results for input(s): TSH, T4TOTAL, FREET4, T3FREE, THYROIDAB in the last 72 hours. Anemia Panel: No results for input(s): VITAMINB12, FOLATE, FERRITIN, TIBC, IRON, RETICCTPCT in the last 72 hours. Sepsis Labs:  Recent Labs Lab 05/20/16 1024 05/20/16 2225 05/21/16 1918 05/21/16 2305  PROCALCITON  --   --  1.85  --   LATICACIDVEN 1.5 1.2 1.2 1.1    Recent Results (from the past 240 hour(s))  Urine culture     Status: Abnormal (Preliminary result)   Collection Time: 05/20/16  3:48 AM  Result Value Ref Range Status   Specimen Description URINE, CLEAN CATCH  Final   Special Requests NONE  Final   Culture (A)  Final    >=100,000 COLONIES/mL PSEUDOMONAS AERUGINOSA SUSCEPTIBILITIES TO FOLLOW Performed at [facility], [address], [place], [place] [address]    Report Status PENDING  Incomplete    Radiology Studies: No results found. Scheduled Meds: . aspirin EC  81 mg Oral Daily  . docusate sodium  100 mg Oral QHS  . enoxaparin (LOVENOX) injection  40 mg Subcutaneous Q24H  . pantoprazole  40 mg Oral q morning - 10a  . piperacillin-tazobactam (ZOSYN)  IV  3.375 g Intravenous Q8H  . simvastatin  40 mg Oral QHS  . tamsulosin  0.4 mg Oral q morning - 10a  . vancomycin  1,000 mg Intravenous Q24H   Continuous Infusions: . sodium chloride 100 mL/hr at 05/22/16 1554    LOS: 2 days   Kerney Elbe, DO [facility] Pager [phone]  If 7PM-7AM, please contact night-coverage [email or web address] Password [facility] 05/22/2016, 5:09 PM

## 2016-05-23 ENCOUNTER — Inpatient Hospital Stay (HOSPITAL_COMMUNITY): Payer: Medicare Other

## 2016-05-23 LAB — MAGNESIUM: Magnesium: 1.8 mg/dL (ref 1.7–2.4)

## 2016-05-23 LAB — CBC WITH DIFFERENTIAL/PLATELET
BASOS ABS: 0 10*3/uL (ref 0.0–0.1)
BASOS PCT: 0 %
Eosinophils Absolute: 0.4 10*3/uL (ref 0.0–0.7)
Eosinophils Relative: 3 %
HEMATOCRIT: 31.8 % — AB (ref 39.0–52.0)
HEMOGLOBIN: 10.7 g/dL — AB (ref 13.0–17.0)
Lymphocytes Relative: 7 %
Lymphs Abs: 1 10*3/uL (ref 0.7–4.0)
MCH: 32.3 pg (ref 26.0–34.0)
MCHC: 33.6 g/dL (ref 30.0–36.0)
MCV: 96.1 fL (ref 78.0–100.0)
MONO ABS: 0.6 10*3/uL (ref 0.1–1.0)
Monocytes Relative: 4 %
NEUTROS PCT: 86 %
Neutro Abs: 12 10*3/uL — ABNORMAL HIGH (ref 1.7–7.7)
Platelets: 225 10*3/uL (ref 150–400)
RBC: 3.31 MIL/uL — ABNORMAL LOW (ref 4.22–5.81)
RDW: 14.8 % (ref 11.5–15.5)
WBC: 14 10*3/uL — ABNORMAL HIGH (ref 4.0–10.5)

## 2016-05-23 LAB — COMPREHENSIVE METABOLIC PANEL
ALBUMIN: 2.2 g/dL — AB (ref 3.5–5.0)
ALT: 13 U/L — ABNORMAL LOW (ref 17–63)
AST: 14 U/L — AB (ref 15–41)
Alkaline Phosphatase: 80 U/L (ref 38–126)
Anion gap: 7 (ref 5–15)
BUN: 15 mg/dL (ref 6–20)
CHLORIDE: 110 mmol/L (ref 101–111)
CO2: 22 mmol/L (ref 22–32)
Calcium: 8 mg/dL — ABNORMAL LOW (ref 8.9–10.3)
Creatinine, Ser: 2.24 mg/dL — ABNORMAL HIGH (ref 0.61–1.24)
GFR calc Af Amer: 36 mL/min — ABNORMAL LOW (ref >60)
GFR calc non Af Amer: 31 mL/min — ABNORMAL LOW (ref >60)
GLUCOSE: 92 mg/dL (ref 65–99)
Potassium: 3.7 mmol/L (ref 3.5–5.1)
SODIUM: 139 mmol/L (ref 135–145)
TOTAL PROTEIN: 6.1 g/dL — AB (ref 6.5–8.1)
Total Bilirubin: 0.8 mg/dL (ref 0.3–1.2)

## 2016-05-23 LAB — URINE CULTURE

## 2016-05-23 LAB — PHOSPHORUS: Phosphorus: 2.1 mg/dL — ABNORMAL LOW (ref 2.5–4.6)

## 2016-05-23 LAB — PROCALCITONIN: PROCALCITONIN: 0.95 ng/mL

## 2016-05-23 MED ORDER — CIPROFLOXACIN IN D5W 400 MG/200ML IV SOLN
400.0000 mg | Freq: Two times a day (BID) | INTRAVENOUS | Status: DC
  Administered 2016-05-23 – 2016-05-25 (×4): 400 mg via INTRAVENOUS
  Filled 2016-05-23 (×4): qty 200

## 2016-05-23 NOTE — Progress Notes (Signed)
PROGRESS NOTE    [name]  NLG:[id number] DOB: [birth year] DOA: 05/20/2016 PCP: Fransisca Kaufmann Dettinger, MD  Brief Narrative:  Frank Levine is a 56 y.o. male with medical history significant of chronic cystitis, chronic supra pubic catheter in use, down's syndrome, stage 2 CKD, h/o cognitive delay and h/o DVT and PE, not on anticoagulation, bilateral hydronephrosis, presents from home with abdominal pain. On arrival to ED, he was found to have low grade fever, borderline bp parameters and labs significant for creatinine of 2.16, UA positive for leukocytes and nitrates, mucous and multiple bacteria. He also underwent CT abd and pelvis showing bilateral hydronephrosis, diffuse bladder thickening, increased density of the urine, consistent with pyelonephritis. He was referred to medical service for evaluation and management of pyelonephritis. He received a dose of rocephin in ED for the infection. Overnight he became septic and was bolused 3 liters and had IV Abx broadened. Urology was consulted and recommended continued medical management. Patient's WBC is improving and Fever is improving. Abx changed to IV Ciprofloxacin as sensitivities have come back for Psuedomonas Aeruginosa UTI.    Assessment & Plan:   Active Problems:   Down's syndrome   GERD (gastroesophageal reflux disease)   Hyperlipidemia   Chronic kidney disease (CKD), stage II (mild)   Pyelonephritis   Acute renal failure (ARF) (HCC)  Sepsis 2/2 to Right Sided Pseudomonas Aeruginosa Pyelonephritis complicated by prominent Hydronephrosis and Hydroureter bilaterally in the setting of Neurogenic Bladder and BPH/Chronic Urethral Stricture -Patient was Febrile, Tachycardic, and Tachypenic and WBC went from 10.3 -> 32.5 -Sepsis Physiology improved -WBC went from 32.5 -> 24.1 -> 14.0 -CT Abd Showed Prominent hydronephrosis and hydroureter bilaterally. Ureteral wall thickening and stranding particularly on the right suggesting pyelitis.  Bladder is decompressed with a suprapubic catheter, but there is evidence of diffuse bladder wall thickening. Increased density of the urine may indicate concentrated urine or hemorrhage.  -S/p Bolus 4 Liters, Decrease Maintenance Fluid at 100 mL/hr to 50 mL/hr -Urinalysis showed Cloudy Urine with Many Bacteria, Large Leukocytes, Positive Nitrites, TNTC WBC and TNTC RBC -Urine Cx shows >100,000 of Pseudomonas Aeruginosa Sensitive to Zosyn and Ciprofloxacin  -Change Broad Spectrum Abx with IV Zosyn and IV Vancomycin to IV Ciprofloxacin -Consulted Urology and Appreciate Recc's -They do not feel as if he needs a Nephrostomy tube at this Point and recommending continuing Abx -Suprapubic Catheter replaced by EDP; C/w Tamsulosin 0.4 mg po every morning -Procalcitonin was 1.85; Lactic Acid Level was 1.2 and repeat Lactic Acid Level was 1.1 -Patient states Abdominal Pain has improved; Pain control with Tramadol 100 mg po q6hprn and Morphine 1-2 mg IV q4hprn for Severe Pain -C/w Acetaminophen 650 mg po q4hprn for Fever, headache, mild pain -PT evaluated and recommend no follow up  Down's Syndrome and Cognitive Delay -No agitation and he appears to be at baseline.   Acute on CKD stage 2  -Worsening of creatinine, suspect dehydration and infection.  -BUN/Cr went from 20/2.16 -> 17/2.30 -> 17/2.20 -> 15/2.24 -C/w IVF Rehydration at 100 mL/hr -Avoid Nephrotoxic Medications and will D/C IV Vancomycin and IV Zosyn -Repeat CMP in AM  Mild Normocytic Anemia.  -Patient's Hb/Hct went from 12.9/37.4 -> 10.7/31.9 -> 10.3/30.9 -> 10.7/31.8 -Repeat CBC in AM  GERD -C/w Pantoprazole 40 mg po Daily  Hx of DVT and PE -Not on Anticoagulation -No Active Issues; C/w AS 81 mg po Daily  Hyperlipidemia -C/w Simvastatin 40 mg po Daily qHS  DVT prophylaxis: Lovenox 40 mg sq q24h Code  Status: FULL CODE Family Communication: Discussed with Mother  Disposition Plan: Home in next 24-48 Hours if Medically stable     Consultants:   Urology   Procedures: None   Antimicrobials:  Anti-infectives    Start     Dose/Rate Route Frequency Ordered Stop   05/21/16 2200  vancomycin (VANCOCIN) IVPB 1000 mg/200 mL premix     1,000 mg 200 mL/hr over 60 Minutes Intravenous Every 24 hours 05/21/16 0309     05/20/16 2330  vancomycin (VANCOCIN) IVPB 1000 mg/200 mL premix     1,000 mg 200 mL/hr over 60 Minutes Intravenous  Once 05/20/16 2316 05/21/16 0052   05/20/16 1000  piperacillin-tazobactam (ZOSYN) IVPB 3.375 g     3.375 g 12.5 mL/hr over 240 Minutes Intravenous Every 8 hours 05/20/16 0939     05/20/16 0615  cefTRIAXone (ROCEPHIN) 1 g in dextrose 5 % 50 mL IVPB     1 g 100 mL/hr over 30 Minutes Intravenous  Once 05/20/16 5009 05/20/16 0831     Subjective: Seen and examined at bedside and he stated he felt good. No abdominal pain and no nausea or vomiting. No CP or SOB. Had no other concerns or complaints at this time.  Objective: Vitals:   05/22/16 1543 05/22/16 2120 05/23/16 0400 05/23/16 0441  BP: 113/68 106/72  113/69  Pulse: (!) 104 (!) 114  (!) 101  Resp: 20 18  16   Temp: 99.3 F (37.4 C) 98.6 F (37 C)  98.4 F (36.9 C)  TempSrc: Oral Oral  Oral  SpO2: 94% 91%  95%  Weight:   84.1 kg (185 lb 4.8 oz)   Height:   5\' 1"  (1.549 m)     Intake/Output Summary (Last 24 hours) at 05/23/16 1502 Last data filed at 05/23/16 1045  Gross per 24 hour  Intake          2761.66 ml  Output             3675 ml  Net          -913.34 ml   Filed Weights   05/20/16 0917 05/23/16 0400  Weight: 79.2 kg (174 lb 9.7 oz) 84.1 kg (185 lb 4.8 oz)   Examination: Physical Exam:  Constitutional:  NAD and appears calm and comfortable Eyes: Lids and conjunctivae normal, sclerae anicteric  ENMT: External Ears normal. Grossly normal hearing. Down Syndrome features Neck: Appears normal, supple, no cervical masses, normal ROM, no appreciable thyromegaly Respiratory: Clear to auscultation bilaterally, no  wheezing, rales, rhonchi or crackles. Normal respiratory effort and patient is not tachypenic. No accessory muscle use.  Cardiovascular: Slightly Tachycardic Rate and Rhythm, no murmurs / rubs / gallops. S1 and S2 auscultated. Mild extremity edema.   Abdomen: Soft, non-tender, non-distended. No masses palpated. No appreciable hepatosplenomegaly. Bowel sounds positive.  GU: Deferred. Suprapubic Catheter in place draining yellow urine.  Musculoskeletal: No clubbing / cyanosis of digits/nails. No joint deformity upper and lower extremities.  Skin: No rashes, lesions, ulcers. No induration; Warm and dry.  Neurologic: CN 2-12 grossly intact with no focal deficits. Sensation intact in all 4 Extremities. Romberg sign cerebellar reflexes not assessed.  Psychiatric: Normal judgment and insight. Alert and awake. Normal mood and appropriate affect.   Data Reviewed: I have personally reviewed following labs and imaging studies  CBC:  Recent Labs Lab 05/20/16 0239 05/21/16 1047 05/22/16 0517 05/23/16 0508  WBC 10.3 32.5* 24.1* 14.0*  NEUTROABS 8.6* 30.2* 22.0* 12.0*  HGB 12.9* 10.7* 10.3* 10.7*  HCT 37.4* 31.9* 30.9* 31.8*  MCV 96.6 99.7 97.2 96.1  PLT 294 198 208 712   Basic Metabolic Panel:  Recent Labs Lab 05/20/16 0423 05/21/16 1047 05/22/16 0517 05/23/16 0508  NA 139 139 138 139  K 3.8 4.1 3.9 3.7  CL 109 114* 111 110  CO2 23 22 20* 22  GLUCOSE 133* 133* 108* 92  BUN 20 17 17 15   CREATININE 2.16* 2.30* 2.20* 2.24*  CALCIUM 8.0* 7.6* 7.8* 8.0*  MG  --  1.9 1.8 1.8  PHOS  --  2.5 2.4* 2.1*   GFR: Estimated Creatinine Clearance: 33.9 mL/min (A) (by C-G formula based on SCr of 2.24 mg/dL (H)). Liver Function Tests:  Recent Labs Lab 05/20/16 0423 05/21/16 1047 05/22/16 0517 05/23/16 0508  AST 15 18 13* 14*  ALT 11* 11* 10* 13*  ALKPHOS 59 59 58 80  BILITOT 0.8 0.5 0.6 0.8  PROT 6.9 6.0* 6.0* 6.1*  ALBUMIN 2.7* 2.1* 2.2* 2.2*    Recent Labs Lab 05/20/16 0423   LIPASE 14   No results for input(s): AMMONIA in the last 168 hours. Coagulation Profile: No results for input(s): INR, PROTIME in the last 168 hours. Cardiac Enzymes: No results for input(s): CKTOTAL, CKMB, CKMBINDEX, TROPONINI in the last 168 hours. BNP (last 3 results) No results for input(s): PROBNP in the last 8760 hours. HbA1C: No results for input(s): HGBA1C in the last 72 hours. CBG: No results for input(s): GLUCAP in the last 168 hours. Lipid Profile: No results for input(s): CHOL, HDL, LDLCALC, TRIG, CHOLHDL, LDLDIRECT in the last 72 hours. Thyroid Function Tests: No results for input(s): TSH, T4TOTAL, FREET4, T3FREE, THYROIDAB in the last 72 hours. Anemia Panel: No results for input(s): VITAMINB12, FOLATE, FERRITIN, TIBC, IRON, RETICCTPCT in the last 72 hours. Sepsis Labs:  Recent Labs Lab 05/20/16 1024 05/20/16 2225 05/21/16 1918 05/21/16 2305 05/23/16 0508  PROCALCITON  --   --  1.85  --  0.95  LATICACIDVEN 1.5 1.2 1.2 1.1  --     Recent Results (from the past 240 hour(s))  Urine culture     Status: Abnormal   Collection Time: 05/20/16  3:48 AM  Result Value Ref Range Status   Specimen Description URINE, CLEAN CATCH  Final   Special Requests NONE  Final   Culture >=100,000 COLONIES/mL PSEUDOMONAS AERUGINOSA (A)  Final   Report Status 05/23/2016 FINAL  Final   Organism ID, Bacteria PSEUDOMONAS AERUGINOSA (A)  Final      Susceptibility   Pseudomonas aeruginosa - MIC*    CEFTAZIDIME 32 RESISTANT Resistant     CIPROFLOXACIN <=0.25 SENSITIVE Sensitive     GENTAMICIN <=1 SENSITIVE Sensitive     IMIPENEM 2 SENSITIVE Sensitive     PIP/TAZO 16 SENSITIVE Sensitive     CEFEPIME 32 RESISTANT Resistant     * >=100,000 COLONIES/mL PSEUDOMONAS AERUGINOSA    Radiology Studies: No results found. Scheduled Meds: . aspirin EC  81 mg Oral Daily  . docusate sodium  100 mg Oral QHS  . enoxaparin (LOVENOX) injection  40 mg Subcutaneous Q24H  . pantoprazole  40 mg Oral  q morning - 10a  . piperacillin-tazobactam (ZOSYN)  IV  3.375 g Intravenous Q8H  . simvastatin  40 mg Oral QHS  . tamsulosin  0.4 mg Oral q morning - 10a  . vancomycin  1,000 mg Intravenous Q24H   Continuous Infusions: . sodium chloride 100 mL/hr at 05/23/16 1159    LOS: 3 days   [place],  DO [facility] Pager [phone]  If 7PM-7AM, please contact night-coverage [email or web address] Password [email or web address] 05/23/2016, 3:02 PM

## 2016-05-23 NOTE — Progress Notes (Signed)
05/23/16 1615  Notified MD of patient's complaint of his swollen scrotum which him and his mom state is new for him. Waiting for response.

## 2016-05-23 NOTE — Progress Notes (Signed)
Pharmacy Antibiotic Note  Frank Levine is a 56 y.o. male admitted on 05/20/2016 with UTI.  Pharmacy has been consulted for ciprofloxacin dosing.  Plan: Ciprofloxacin 400 mg IV q12h  Height: 5\' 1"  (154.9 cm) Weight: 185 lb 4.8 oz (84.1 kg) IBW/kg (Calculated) : 52.3  Temp (24hrs), Avg:98.8 F (37.1 C), Min:98.4 F (36.9 C), Max:99.3 F (37.4 C)   Recent Labs Lab 05/20/16 0239 05/20/16 0423 05/20/16 1024 05/20/16 2225 05/21/16 1047 05/21/16 1918 05/21/16 2305 05/22/16 0517 05/23/16 0508  WBC 10.3  --   --   --  32.5*  --   --  24.1* 14.0*  CREATININE  --  2.16*  --   --  2.30*  --   --  2.20* 2.24*  LATICACIDVEN  --   --  1.5 1.2  --  1.2 1.1  --   --     Estimated Creatinine Clearance: 33.9 mL/min (A) (by C-G formula based on SCr of 2.24 mg/dL (H)).    No Known Allergies  Antimicrobials this admission: 3/16: Vanc >> 3/19 3/16 zosyn >> 3/19 3/19 ciprofloxacin    Dose adjustments this admission: ---  Microbiology results: 3/16 UCx: >100K PsA ( R - cefepime, ceftazidime, S - Cipro, gent, imipenem, Zosyn)  3/18: BC x2: sent  Thank you for allowing pharmacy to be a part of this patient's care.   Royetta Asal, PharmD, BCPS Pager [phone] 05/23/2016 3:20 PM

## 2016-05-23 NOTE — Evaluation (Signed)
Physical Therapy Evaluation Patient Details Name: Frank Levine MRN: [id number] DOB: Jul 24, 1960 Today's Date: 05/23/2016   History of Present Illness  Frank Levine is a 56 y.o. male with medical history significant of chronic cystitis, chronic supra pubic catheter in use, down's syndrome, stage 2 CKD,cognitive delay and DVT and PE, not on anticoagulation, bilateral hydronephrosis, presents from home with abdominal pain-->pyelonephritis  Clinical Impression  Pt admitted with above diagnosis. Pt currently with functional limitations due to the deficits listed below (see PT Problem List).  Pt will benefit from skilled PT to increase their independence and safety with mobility to allow discharge to the venue listed below. Pt should progress well, would benefit from amb with nursing staff; will follow      Follow Up Recommendations No PT follow up    Equipment Recommendations  None recommended by PT    Recommendations for Other Services       Precautions / Restrictions Precautions Precautions: Fall      Mobility  Bed Mobility               General bed mobility comments: NT-in chair on arrival  Transfers Overall transfer level: Needs assistance Equipment used: None Transfers: Sit to/from Stand Sit to Stand: Min guard;Supervision         General transfer comment: for safety  Ambulation/Gait Ambulation/Gait assistance: Min guard;Min assist Ambulation Distance (Feet): 75 Feet Assistive device: Rolling walker (2 wheeled);None Gait Pattern/deviations: Step-through pattern;Wide base of support;Decreased stride length     General Gait Details: amb with  bil LEs in external rotation, unsteady without UE support, improved stability with RW  Stairs            Wheelchair Mobility    Modified Rankin (Stroke Patients Only)       Balance Overall balance assessment: Needs assistance           Standing balance-Leahy Scale: Fair Standing balance comment: fair  static, poor dynamic, reliant on UEs                              Pertinent Vitals/Pain Pain Assessment: No/denies pain    Home Living Family/patient expects to be discharged to:: Private residence Living Arrangements: Parent   Type of Home: House Home Access: Stairs to enter   Entrance Stairs-Number of Steps: 3 Home Layout: One level;Laundry or work area in [employer]: None      Prior Function Level of Independence: Independent               Journalist, newspaper        Extremity/Trunk Assessment   Upper Extremity Assessment Upper Extremity Assessment: Overall WFL for tasks assessed    Lower Extremity Assessment Lower Extremity Assessment:  (decr internal rotation bil hips) RLE Deficits / Details: grossly WFL, c/o knee stiffness and anterior knee pain over quad tendon with AROM       Communication   Communication: No difficulties  Cognition Arousal/Alertness: Awake/alert Behavior During Therapy: WFL for tasks assessed/performed Overall Cognitive Status: Impaired/Different from baseline (down's with cogntive delay per chart)                 General Comments: answers questions and follows commands without difficulty    General Comments      Exercises     Assessment/Plan    PT Assessment Patient needs continued PT services  PT Problem List Decreased activity tolerance;Decreased balance;Decreased knowledge of use of  DME;Decreased mobility       PT Treatment Interventions DME instruction;Gait training;Functional mobility training;Patient/family education;Therapeutic activities    PT Goals (Current goals can be found in the Care Plan section)  Acute Rehab PT Goals Patient Stated Goal: back to walking, home PT Goal Formulation: With patient Time For Goal Achievement: 05/30/16 Potential to Achieve Goals: Good    Frequency Min 3X/week   Barriers to discharge        Co-evaluation               End of Session  Equipment Utilized During Treatment: Gait belt Activity Tolerance: Patient tolerated treatment well Patient left: in chair;with call bell/phone within reach;with family/visitor present Nurse Communication: Mobility status PT Visit Diagnosis: Other abnormalities of gait and mobility (R26.89)         Time: 8182-9937 PT Time Calculation (min) (ACUTE ONLY): 20 min   Charges:   PT Evaluation $PT Eval Low Complexity: 1 Procedure     PT G Codes:         Frank Levine 06/07/16, 12:27 PM

## 2016-05-24 DIAGNOSIS — R609 Edema, unspecified: Secondary | ICD-10-CM

## 2016-05-24 DIAGNOSIS — N452 Orchitis: Secondary | ICD-10-CM

## 2016-05-24 DIAGNOSIS — N451 Epididymitis: Secondary | ICD-10-CM

## 2016-05-24 DIAGNOSIS — R079 Chest pain, unspecified: Secondary | ICD-10-CM

## 2016-05-24 LAB — COMPREHENSIVE METABOLIC PANEL
ALT: 17 U/L (ref 17–63)
AST: 17 U/L (ref 15–41)
Albumin: 2.2 g/dL — ABNORMAL LOW (ref 3.5–5.0)
Alkaline Phosphatase: 68 U/L (ref 38–126)
Anion gap: 8 (ref 5–15)
BILIRUBIN TOTAL: 0.5 mg/dL (ref 0.3–1.2)
BUN: 16 mg/dL (ref 6–20)
CHLORIDE: 110 mmol/L (ref 101–111)
CO2: 23 mmol/L (ref 22–32)
Calcium: 8.1 mg/dL — ABNORMAL LOW (ref 8.9–10.3)
Creatinine, Ser: 2.04 mg/dL — ABNORMAL HIGH (ref 0.61–1.24)
GFR, EST AFRICAN AMERICAN: 40 mL/min — AB (ref >60)
GFR, EST NON AFRICAN AMERICAN: 35 mL/min — AB (ref >60)
Glucose, Bld: 107 mg/dL — ABNORMAL HIGH (ref 65–99)
POTASSIUM: 3.4 mmol/L — AB (ref 3.5–5.1)
Sodium: 141 mmol/L (ref 135–145)
TOTAL PROTEIN: 6.3 g/dL — AB (ref 6.5–8.1)

## 2016-05-24 LAB — CBC WITH DIFFERENTIAL/PLATELET
Basophils Absolute: 0.1 10*3/uL (ref 0.0–0.1)
Basophils Relative: 1 %
EOS PCT: 3 %
Eosinophils Absolute: 0.3 10*3/uL (ref 0.0–0.7)
HEMATOCRIT: 31.7 % — AB (ref 39.0–52.0)
Hemoglobin: 10.7 g/dL — ABNORMAL LOW (ref 13.0–17.0)
LYMPHS ABS: 0.9 10*3/uL (ref 0.7–4.0)
LYMPHS PCT: 11 %
MCH: 31.8 pg (ref 26.0–34.0)
MCHC: 33.8 g/dL (ref 30.0–36.0)
MCV: 94.3 fL (ref 78.0–100.0)
MONO ABS: 0.8 10*3/uL (ref 0.1–1.0)
MONOS PCT: 10 %
NEUTROS ABS: 6.4 10*3/uL (ref 1.7–7.7)
Neutrophils Relative %: 75 %
PLATELETS: 217 10*3/uL (ref 150–400)
RBC: 3.36 MIL/uL — ABNORMAL LOW (ref 4.22–5.81)
RDW: 14.5 % (ref 11.5–15.5)
WBC: 8.5 10*3/uL (ref 4.0–10.5)

## 2016-05-24 LAB — TROPONIN I: Troponin I: <0.03 ng/mL (ref <0.03)

## 2016-05-24 LAB — MAGNESIUM: MAGNESIUM: 1.8 mg/dL (ref 1.7–2.4)

## 2016-05-24 LAB — PHOSPHORUS: PHOSPHORUS: 2.6 mg/dL (ref 2.5–4.6)

## 2016-05-24 MED ORDER — POTASSIUM CHLORIDE CRYS ER 20 MEQ PO TBCR
40.0000 meq | EXTENDED_RELEASE_TABLET | Freq: Two times a day (BID) | ORAL | Status: AC
  Administered 2016-05-24 (×2): 40 meq via ORAL
  Filled 2016-05-24 (×2): qty 2

## 2016-05-24 NOTE — Care Management Important Message (Signed)
Important Message  Patient Details  Name: Frank Levine MRN: [id number] Date of Birth: Apr 09, 1960   Medicare Important Message Given:  Yes    Kerin Salen 05/24/2016, 10:12 AMImportant Message  Patient Details  Name: Frank Levine MRN: [id number] Date of Birth: Sep 21, 1960   Medicare Important Message Given:  Yes    Kerin Salen 05/24/2016, 10:12 AM

## 2016-05-24 NOTE — Progress Notes (Signed)
Central telemetry reported pt had a 2.55 sec pause. Pt was dry heaving at the time. MD made aware. Orders obtained. Eulas Post, RN

## 2016-05-24 NOTE — Progress Notes (Signed)
PROGRESS NOTE    Frank Levine  PXT:[id number] DOB: 11/21/1960 DOA: 05/20/2016 PCP: Fransisca Kaufmann Dettinger, MD  Brief Narrative:  Frank Levine is a 56 y.o. male with medical history significant of chronic cystitis, chronic supra pubic catheter in use, down's syndrome, stage 2 CKD, h/o cognitive delay and h/o DVT and PE, not on anticoagulation, bilateral hydronephrosis, presents from home with abdominal pain. On arrival to ED, he was found to have low grade fever, borderline bp parameters and labs significant for creatinine of 2.16, UA positive for leukocytes and nitrates, mucous and multiple bacteria. He also underwent CT abd and pelvis showing bilateral hydronephrosis, diffuse bladder thickening, increased density of the urine, consistent with pyelonephritis. He was referred to medical service for evaluation and management of pyelonephritis. He received a dose of rocephin in ED for the infection. Overnight he became septic and was bolused 3 liters and had IV Abx broadened. Urology was consulted and recommended continued medical management. Patient's WBC is improving and Fever is improving. Abx changed to IV Ciprofloxacin as sensitivities have come back for Psuedomonas Aeruginosa UTI.    Assessment & Plan:   Active Problems:   Down's syndrome   GERD (gastroesophageal reflux disease)   Hyperlipidemia   Chronic kidney disease (CKD), stage II (mild)   Pyelonephritis   Acute renal failure (ARF) (HCC)  Sepsis 2/2 to Right Sided Pseudomonas Aeruginosa Pyelonephritis complicated by prominent Hydronephrosis and Hydroureter bilaterally in the setting of Neurogenic Bladder and BPH/Chronic Urethral Stricture -Patient was Febrile, Tachycardic, and Tachypenic and WBC went from 10.3 -> 32.5 -Sepsis Physiology improved -WBC went from 32.5 -> 24.1 -> 14.0 -> 8.5 -CT Abd Showed Prominent hydronephrosis and hydroureter bilaterally. Ureteral wall thickening and stranding particularly on the right suggesting  pyelitis. Bladder is decompressed with a suprapubic catheter, but there is evidence of diffuse bladder wall thickening. Increased density of the urine may indicate concentrated urine or hemorrhage.  -S/p Bolus 4 Liters, D/C'd IVF -Urinalysis showed Cloudy Urine with Many Bacteria, Large Leukocytes, Positive Nitrites, TNTC WBC and TNTC RBC -Urine Cx shows >100,000 of Pseudomonas Aeruginosa Sensitive to Zosyn and Ciprofloxacin  -Change Broad Spectrum Abx with IV Zosyn and IV Vancomycin to IV Ciprofloxacin -Consulted Urology and Appreciate Recc's -They do not feel as if he needs a Nephrostomy tube at this Point and recommending continuing Abx; Will need to follow up with Urology about additional Recc's -Suprapubic Catheter replaced by EDP; C/w Tamsulosin 0.4 mg po every morning -Procalcitonin was 1.85; Lactic Acid Level was 1.2 and repeat Lactic Acid Level was 1.1 -Patient states Abdominal Pain has improved; Pain control with Tramadol 100 mg po q6hprn and Morphine 1-2 mg IV q4hprn for Severe Pain -C/w Acetaminophen 650 mg po q4hprn for Fever, headache, mild pain -PT evaluated and recommend no follow up  Right Epididymitis and Orchitis -Doppler of Testes showed no testicular torsion but did show Right epididymitis and orchits with difuse hyperemia and a small to moderate complex right-side hydroceole with numerous septations and internal echos, likely reflecting the Right sided orchitis -Relative large simple Left-sided Hydrocele seen and a incidental note of a 4 mm cyst at the Left Epididymal Head -Scrotal Sling and Support with Scrotal Elevation -C/w IV Ciprofloxacin -WBC Trending down -Will need to discuss with Urology about any additional reccomendations   Chest Pain r/o ACS -Complained of Chest Pain that resolved -Cycle Troponins -Reviewed EKG and was unchanged from prior in 2013  Down's Syndrome and Cognitive Delay -No agitation and he appears to be  at baseline.    Hypokalemia -Patient's K+ was 3.4 -Replete with Potassium Chloride 40 mEQ po BID -Repeat CMP in AM  Acute on CKD stage 2  -Worsening of creatinine, suspect dehydration and infection.  -BUN/Cr went from 20/2.16 -> 17/2.30 -> 17/2.20 -> 15/2.24 -> 16/2.04 -C/w IVF Rehydration at 100 mL/hr -Avoid Nephrotoxic Medications and will D/C IV Vancomycin and IV Zosyn -Repeat CMP in AM  Mild Normocytic Anemia.  -Patient's Hb/Hct went from 12.9/37.4 -> 10.7/31.9 -> 10.3/30.9 -> 10.7/31.8 -> 10.3/30.9 -Repeat CBC in AM  GERD -C/w Pantoprazole 40 mg po Daily  Hx of DVT and PE -Not on Anticoagulation -No Active Issues; C/w AS 81 mg po Daily  Hyperlipidemia -C/w Simvastatin 40 mg po Daily qHS  DVT prophylaxis: Lovenox 40 mg sq q24h Code Status: FULL CODE Family Communication: Discussed with Mother  Disposition Plan: Home in next 24-48 Hours if Medically stable   Consultants:   Urology   Procedures: None   Antimicrobials:  Anti-infectives    Start     Dose/Rate Route Frequency Ordered Stop   05/23/16 1600  ciprofloxacin (CIPRO) IVPB 400 mg     400 mg 200 mL/hr over 60 Minutes Intravenous Every 12 hours 05/23/16 1520     05/21/16 2200  vancomycin (VANCOCIN) IVPB 1000 mg/200 mL premix  Status:  Discontinued     1,000 mg 200 mL/hr over 60 Minutes Intravenous Every 24 hours 05/21/16 0309 05/23/16 1503   05/20/16 2330  vancomycin (VANCOCIN) IVPB 1000 mg/200 mL premix     1,000 mg 200 mL/hr over 60 Minutes Intravenous  Once 05/20/16 2316 05/21/16 0052   05/20/16 1000  piperacillin-tazobactam (ZOSYN) IVPB 3.375 g  Status:  Discontinued     3.375 g 12.5 mL/hr over 240 Minutes Intravenous Every 8 hours 05/20/16 0939 05/23/16 1515   05/20/16 0615  cefTRIAXone (ROCEPHIN) 1 g in dextrose 5 % 50 mL IVPB     1 g 100 mL/hr over 30 Minutes Intravenous  Once 05/20/16 0605 05/20/16 0831     Subjective: Seen and examined at bedside and he stated he felt good. No abdominal pain and no  nausea or vomiting. Scrotum was swollen and red but he was not complaining of any issues. Later on in the evening nurse called and stated patient had CP but it resolved within a few minutes. No other concerns or complaints.   Objective: Vitals:   05/24/16 0403 05/24/16 1030 05/24/16 1500 05/24/16 1735  BP: 120/73 112/64 110/72 116/78  Pulse: (!) 102  (!) 103 (!) 108  Resp: 20 18 18    Temp: 98.9 F (37.2 C) 98 F (36.7 C) 99.1 F (37.3 C)   TempSrc: Oral Oral Oral   SpO2: 93% 91% 94%   Weight:      Height:        Intake/Output Summary (Last 24 hours) at 05/24/16 1908 Last data filed at 05/24/16 1900  Gross per 24 hour  Intake              920 ml  Output             6350 ml  Net            -5430 ml   Filed Weights   05/20/16 0917 05/23/16 0400  Weight: 79.2 kg (174 lb 9.7 oz) 84.1 kg (185 lb 4.8 oz)   Examination: Physical Exam:  Constitutional:  NAD and appears calm and comfortable Eyes: Lids and conjunctivae normal, sclerae anicteric  ENMT: External Ears normal.  Grossly normal hearing. Down Syndrome features Neck: Appears normal, supple, no cervical masses, normal ROM, no appreciable thyromegaly Respiratory: Clear to auscultation bilaterally, no wheezing, rales, rhonchi or crackles. Normal respiratory effort and patient is not tachypenic. No accessory muscle use.  Cardiovascular: Slightly Tachycardic Rate and Rhythm, no murmurs / rubs / gallops. S1 and S2 auscultated. Mild extremity edema.   Abdomen: Soft, non-tender, non-distended. No masses palpated. No appreciable hepatosplenomegaly. Bowel sounds positive.  GU: Deferred. Suprapubic Catheter in place draining yellow urine.  Musculoskeletal: No clubbing / cyanosis of digits/nails. No joint deformity upper and lower extremities.  Skin: No rashes, lesions, ulcers. No induration; Warm and dry.  Neurologic: CN 2-12 grossly intact with no focal deficits. Sensation intact in all 4 Extremities. Romberg sign cerebellar reflexes not  assessed.  Psychiatric: Normal judgment and insight. Alert and awake. Normal mood and appropriate affect.   Data Reviewed: I have personally reviewed following labs and imaging studies  CBC:  Recent Labs Lab 05/20/16 0239 05/21/16 1047 05/22/16 0517 05/23/16 0508 05/24/16 0628  WBC 10.3 32.5* 24.1* 14.0* 8.5  NEUTROABS 8.6* 30.2* 22.0* 12.0* 6.4  HGB 12.9* 10.7* 10.3* 10.7* 10.7*  HCT 37.4* 31.9* 30.9* 31.8* 31.7*  MCV 96.6 99.7 97.2 96.1 94.3  PLT 294 198 208 225 128   Basic Metabolic Panel:  Recent Labs Lab 05/20/16 0423 05/21/16 1047 05/22/16 0517 05/23/16 0508 05/24/16 0628  NA 139 139 138 139 141  K 3.8 4.1 3.9 3.7 3.4*  CL 109 114* 111 110 110  CO2 23 22 20* 22 23  GLUCOSE 133* 133* 108* 92 107*  BUN 20 17 17 15 16   CREATININE 2.16* 2.30* 2.20* 2.24* 2.04*  CALCIUM 8.0* 7.6* 7.8* 8.0* 8.1*  MG  --  1.9 1.8 1.8 1.8  PHOS  --  2.5 2.4* 2.1* 2.6   GFR: Estimated Creatinine Clearance: 37.2 mL/min (A) (by C-G formula based on SCr of 2.04 mg/dL (H)). Liver Function Tests:  Recent Labs Lab 05/20/16 0423 05/21/16 1047 05/22/16 0517 05/23/16 0508 05/24/16 0628  AST 15 18 13* 14* 17  ALT 11* 11* 10* 13* 17  ALKPHOS 59 59 58 80 68  BILITOT 0.8 0.5 0.6 0.8 0.5  PROT 6.9 6.0* 6.0* 6.1* 6.3*  ALBUMIN 2.7* 2.1* 2.2* 2.2* 2.2*    Recent Labs Lab 05/20/16 0423  LIPASE 14   No results for input(s): AMMONIA in the last 168 hours. Coagulation Profile: No results for input(s): INR, PROTIME in the last 168 hours. Cardiac Enzymes:  Recent Labs Lab 05/24/16 1755  TROPONINI <0.03   BNP (last 3 results) No results for input(s): PROBNP in the last 8760 hours. HbA1C: No results for input(s): HGBA1C in the last 72 hours. CBG: No results for input(s): GLUCAP in the last 168 hours. Lipid Profile: No results for input(s): CHOL, HDL, LDLCALC, TRIG, CHOLHDL, LDLDIRECT in the last 72 hours. Thyroid Function Tests: No results for input(s): TSH, T4TOTAL, FREET4,  T3FREE, THYROIDAB in the last 72 hours. Anemia Panel: No results for input(s): VITAMINB12, FOLATE, FERRITIN, TIBC, IRON, RETICCTPCT in the last 72 hours. Sepsis Labs:  Recent Labs Lab 05/20/16 1024 05/20/16 2225 05/21/16 1918 05/21/16 2305 05/23/16 0508  PROCALCITON  --   --  1.85  --  0.95  LATICACIDVEN 1.5 1.2 1.2 1.1  --     Recent Results (from the past 240 hour(s))  Urine culture     Status: Abnormal   Collection Time: 05/20/16  3:48 AM  Result Value Ref Range Status  Specimen Description URINE, CLEAN CATCH  Final   Special Requests NONE  Final   Culture >=100,000 COLONIES/mL PSEUDOMONAS AERUGINOSA (A)  Final   Report Status 05/23/2016 FINAL  Final   Organism ID, Bacteria PSEUDOMONAS AERUGINOSA (A)  Final      Susceptibility   Pseudomonas aeruginosa - MIC*    CEFTAZIDIME 32 RESISTANT Resistant     CIPROFLOXACIN <=0.25 SENSITIVE Sensitive     GENTAMICIN <=1 SENSITIVE Sensitive     IMIPENEM 2 SENSITIVE Sensitive     PIP/TAZO 16 SENSITIVE Sensitive     CEFEPIME 32 RESISTANT Resistant     * >=100,000 COLONIES/mL PSEUDOMONAS AERUGINOSA  Culture, blood (Routine X 2) w Reflex to ID Panel     Status: None (Preliminary result)   Collection Time: 05/22/16  9:02 AM  Result Value Ref Range Status   Specimen Description BLOOD LEFT ANTECUBITAL  Final   Special Requests BOTTLES DRAWN AEROBIC AND ANAEROBIC 5CC  Final   Culture   Final    NO GROWTH 2 DAYS Performed at [facility], [address] [place], [place] [address]    Report Status PENDING  Incomplete  Culture, blood (Routine X 2) w Reflex to ID Panel     Status: None (Preliminary result)   Collection Time: 05/22/16  9:06 AM  Result Value Ref Range Status   Specimen Description BLOOD LEFT HAND  Final   Special Requests BOTTLES DRAWN AEROBIC ONLY 5CC  Final   Culture   Final    NO GROWTH 2 DAYS Performed at [facility], [place] [address], [place], [place] [address]    Report Status PENDING  Incomplete     Radiology Studies: US Scrotum  Result Date: 05/23/2016 CLINICAL DATA:  Acute onset of scrotal swelling.  Initial encounter. EXAM: SCROTAL ULTRASOUND DOPPLER ULTRASOUND OF THE TESTICLES TECHNIQUE: Complete ultrasound examination of the testicles, epididymis, and other scrotal structures was performed. Color and spectral Doppler ultrasound were also utilized to evaluate blood flow to the testicles. COMPARISON:  CT of the abdomen and pelvis performed 05/20/2016 FINDINGS: Right testicle Measurements: 4.0 x 2.9 x 3.0 cm. The right testis is heterogeneous in appearance, with diffuse hyperemia, concerning for right-sided orchitis. No mass or microlithiasis seen. Left testicle Measurements: 3.4 x 1.8 x 2.2 cm. No mass or microlithiasis visualized. Right epididymis: There is asymmetric prominence of the right epididymis, with associated hyperemia, compatible with right-sided epididymitis. Left epididymis:  A 4 mm cyst is noted at the left epididymal head. Hydrocele: A small to moderate complex right-sided hydrocele is seen, with numerous septations and internal echoes. A relatively large simple left-sided hydrocele is noted. Varicocele:  None visualized. Pulsed Doppler interrogation of both testes demonstrates normal low resistance arterial and venous waveforms bilaterally. IMPRESSION: 1. No evidence of testicular torsion. 2. Right-sided epididymitis and orchitis, with diffuse hyperemia. 3. Small to moderate complex right-sided hydrocele, with numerous septations and internal echoes, likely reflecting the right-sided orchitis. Relatively large simple left-sided hydrocele seen. 4. Incidental note of a 4 mm cyst at the left epididymal head. Electronically Signed   By: Garald Balding M.D.   On: 05/23/2016 21:04   [place] Art/ven Flow Abd Pelv Doppler  Result Date: 05/23/2016 CLINICAL DATA:  Acute onset of scrotal swelling.  Initial encounter. EXAM: SCROTAL ULTRASOUND DOPPLER ULTRASOUND OF THE TESTICLES TECHNIQUE: Complete  ultrasound examination of the testicles, epididymis, and other scrotal structures was performed. Color and spectral Doppler ultrasound were also utilized to evaluate blood flow to the testicles. COMPARISON:  CT of the abdomen and pelvis performed 05/20/2016 FINDINGS: Right testicle Measurements: 4.0 x 2.9 x 3.0 cm. The right testis is heterogeneous in appearance, with diffuse hyperemia, concerning for right-sided orchitis. No mass or microlithiasis seen. Left testicle Measurements: 3.4 x 1.8 x 2.2 cm. No mass or microlithiasis visualized. Right epididymis: There is asymmetric prominence of the right epididymis, with associated hyperemia, compatible with right-sided epididymitis. Left epididymis:  A 4 mm cyst is noted at the left epididymal head. Hydrocele: A small to moderate complex right-sided hydrocele is seen, with numerous septations and internal echoes. A relatively large simple left-sided hydrocele is noted. Varicocele:  None visualized. Pulsed Doppler interrogation of both testes demonstrates normal low resistance arterial and venous waveforms bilaterally. IMPRESSION: 1. No evidence of testicular torsion. 2. Right-sided epididymitis and orchitis, with diffuse hyperemia. 3. Small to moderate complex right-sided hydrocele, with numerous septations and internal echoes, likely reflecting the right-sided orchitis. Relatively large simple left-sided hydrocele seen. 4. Incidental note of a 4 mm cyst at the left epididymal head. Electronically Signed   By: Garald Balding M.D.   On: 05/23/2016 21:04   Scheduled Meds: . aspirin EC  81 mg Oral Daily  . ciprofloxacin  400 mg Intravenous Q12H  . docusate sodium  100 mg Oral QHS  . enoxaparin (LOVENOX) injection  40 mg Subcutaneous Q24H  . pantoprazole  40 mg Oral q morning - 10a  . potassium chloride  40 mEq Oral BID  . simvastatin  40 mg Oral QHS  . tamsulosin  0.4 mg Oral q morning - 10a   Continuous Infusions: . sodium chloride 50 mL/hr at 05/24/16 0409      LOS: 4 days   Kerney Elbe, DO [facility] Pager [phone]  If 7PM-7AM, please contact night-coverage [email or web address] Password [email or web address] 05/24/2016, 7:08 PM

## 2016-05-24 NOTE — Progress Notes (Addendum)
Spoke with pt concerning HH. Pt's mother states that she would like a HHRN to come in to check on pt and selected [place].

## 2016-05-24 NOTE — Progress Notes (Signed)
Pt c/o chest pain under left breast. Non-radiating. Skin warm and dry. VSS. No change on telemetry. EKG obtained. MD notified. Pain lasted aprox 20 min then resolved. Eulas Post, RN

## 2016-05-25 ENCOUNTER — Inpatient Hospital Stay (HOSPITAL_COMMUNITY): Payer: Medicare Other

## 2016-05-25 DIAGNOSIS — M25561 Pain in right knee: Secondary | ICD-10-CM

## 2016-05-25 LAB — PHOSPHORUS: PHOSPHORUS: 2.9 mg/dL (ref 2.5–4.6)

## 2016-05-25 LAB — CBC WITH DIFFERENTIAL/PLATELET
BASOS PCT: 1 %
Basophils Absolute: 0 10*3/uL (ref 0.0–0.1)
EOS ABS: 0.3 10*3/uL (ref 0.0–0.7)
EOS PCT: 4 %
HEMATOCRIT: 33.9 % — AB (ref 39.0–52.0)
Hemoglobin: 11.6 g/dL — ABNORMAL LOW (ref 13.0–17.0)
Lymphocytes Relative: 11 %
Lymphs Abs: 0.8 10*3/uL (ref 0.7–4.0)
MCH: 32.4 pg (ref 26.0–34.0)
MCHC: 34.2 g/dL (ref 30.0–36.0)
MCV: 94.7 fL (ref 78.0–100.0)
MONO ABS: 1 10*3/uL (ref 0.1–1.0)
MONOS PCT: 14 %
Neutro Abs: 5 10*3/uL (ref 1.7–7.7)
Neutrophils Relative %: 70 %
Platelets: 239 10*3/uL (ref 150–400)
RBC: 3.58 MIL/uL — ABNORMAL LOW (ref 4.22–5.81)
RDW: 14.4 % (ref 11.5–15.5)
WBC: 7.1 10*3/uL (ref 4.0–10.5)

## 2016-05-25 LAB — COMPREHENSIVE METABOLIC PANEL
ALK PHOS: 66 U/L (ref 38–126)
ALT: 26 U/L (ref 17–63)
AST: 21 U/L (ref 15–41)
Albumin: 2.3 g/dL — ABNORMAL LOW (ref 3.5–5.0)
Anion gap: 7 (ref 5–15)
BUN: 16 mg/dL (ref 6–20)
CALCIUM: 8.6 mg/dL — AB (ref 8.9–10.3)
CHLORIDE: 107 mmol/L (ref 101–111)
CO2: 26 mmol/L (ref 22–32)
CREATININE: 1.89 mg/dL — AB (ref 0.61–1.24)
GFR, EST AFRICAN AMERICAN: 44 mL/min — AB (ref >60)
GFR, EST NON AFRICAN AMERICAN: 38 mL/min — AB (ref >60)
Glucose, Bld: 109 mg/dL — ABNORMAL HIGH (ref 65–99)
Potassium: 3.9 mmol/L (ref 3.5–5.1)
Sodium: 140 mmol/L (ref 135–145)
Total Bilirubin: 0.6 mg/dL (ref 0.3–1.2)
Total Protein: 6.8 g/dL (ref 6.5–8.1)

## 2016-05-25 LAB — PROCALCITONIN: PROCALCITONIN: 0.35 ng/mL

## 2016-05-25 LAB — TROPONIN I: Troponin I: <0.03 ng/mL (ref <0.03)

## 2016-05-25 LAB — MAGNESIUM: MAGNESIUM: 1.9 mg/dL (ref 1.7–2.4)

## 2016-05-25 LAB — URIC ACID: Uric Acid, Serum: 5.3 mg/dL (ref 4.4–7.6)

## 2016-05-25 MED ORDER — CIPROFLOXACIN HCL 500 MG PO TABS
500.0000 mg | ORAL_TABLET | Freq: Two times a day (BID) | ORAL | Status: DC
  Administered 2016-05-25 – 2016-05-26 (×3): 500 mg via ORAL
  Filled 2016-05-25 (×3): qty 1

## 2016-05-25 NOTE — Progress Notes (Addendum)
Physical Therapy Treatment Patient Details Name: Frank Levine MRN: [id number] DOB: 1960-06-16 Today's Date: 05/25/2016    History of Present Illness Frank Levine is a 56 y.o. male with medical history significant of chronic cystitis, chronic supra pubic catheter in use, down's syndrome, stage 2 CKD,cognitive delay and DVT and PE, not on anticoagulation, bilateral hydronephrosis, presents from home with abdominal pain-->pyelonephritis    PT Comments    Pt ambulated 200' with RW with min/guard assist, no loss of balance. Pt/mother report new R medial knee pain at rest, they deny injury,  xray pending. HR 120 max with walking.   Follow Up Recommendations  Home health PT     Equipment Recommendations  Rolling walker with 5" wheels    Recommendations for Other Services       Precautions / Restrictions Precautions Precautions: Fall Restrictions Weight Bearing Restrictions: No    Mobility  Bed Mobility Overal bed mobility: Modified Independent             General bed mobility comments: HOB up 40*  Transfers Overall transfer level: Needs assistance Equipment used: Rolling walker (2 wheeled) Transfers: Sit to/from Stand Sit to Stand: Min guard;Supervision         General transfer comment: for safety  Ambulation/Gait Ambulation/Gait assistance: Min guard;Min assist Ambulation Distance (Feet): 200 Feet Assistive device: Rolling walker (2 wheeled) Gait Pattern/deviations: Step-through pattern;Wide base of support;Decreased stride length   Gait velocity interpretation: at or above normal speed for age/gender General Gait Details: amb with  bil LEs in external rotation, steady with RW, reported R hand discomfort from use of RW, consider cushioning handle next session   Stairs            Wheelchair Mobility    Modified Rankin (Stroke Patients Only)       Balance Overall balance assessment: Needs assistance           Standing balance-Leahy Scale:  Fair Standing balance comment: fair static, poor dynamic, reliant on UEs                     Cognition Arousal/Alertness: Awake/alert Behavior During Therapy: WFL for tasks assessed/performed Overall Cognitive Status: History of cognitive impairments - at baseline                 General Comments: answers questions and follows commands without difficulty    Exercises      General Comments        Pertinent Vitals/Pain Pain Assessment: Faces Faces Pain Scale: Hurts even more Pain Location: R medial knee at rest and with walking (pt/mother report this is new as of yesterday, deny injury) Pain Intervention(s): Limited activity within patient's tolerance;Monitored during session;Ice applied;Premedicated before session    Home Living                      Prior Function            PT Goals (current goals can now be found in the care plan section) Acute Rehab PT Goals Patient Stated Goal: back to walking, home PT Goal Formulation: With patient Time For Goal Achievement: 05/30/16 Potential to Achieve Goals: Good Progress towards PT goals: Progressing toward goals    Frequency    Min 3X/week      PT Plan Current plan remains appropriate    Co-evaluation             End of Session Equipment Utilized During Treatment: Gait belt Activity  Tolerance: Patient tolerated treatment well Patient left: in chair;with call bell/phone within reach;with family/visitor present Nurse Communication: Mobility status PT Visit Diagnosis: Other abnormalities of gait and mobility (R26.89)     Time: 1753-0104 PT Time Calculation (min) (ACUTE ONLY): 18 min  Charges:  $Gait Training: 8-22 mins                    G Codes:       Philomena Doheny 05/25/2016, 11:38 AM [phone]

## 2016-05-25 NOTE — Progress Notes (Signed)
PROGRESS NOTE    Frank Levine  VOZ:[id number] DOB: 21-Mar-1960 DOA: 05/20/2016 PCP: Fransisca Kaufmann Dettinger, MD  Brief Narrative:  Frank Levine is a 56 y.o. male with medical history significant of chronic cystitis, chronic supra pubic catheter in use, down's syndrome, stage 2 CKD, h/o cognitive delay and h/o DVT and PE, not on anticoagulation, bilateral hydronephrosis, presents from home with abdominal pain. On arrival to ED, he was found to have low grade fever, borderline bp parameters and labs significant for creatinine of 2.16, UA positive for leukocytes and nitrates, mucous and multiple bacteria. He also underwent CT abd and pelvis showing bilateral hydronephrosis, diffuse bladder thickening, increased density of the urine, consistent with pyelonephritis. He was referred to medical service for evaluation and management of pyelonephritis. He received a dose of rocephin in ED for the infection. Overnight he became septic and was bolused 3 liters and had IV Abx broadened. Urology was consulted and recommended continued medical management. Patient's WBC is improving and Fever is improving. Abx changed to IV Ciprofloxacin as sensitivities have come back for Psuedomonas Aeruginosa UTI.    Assessment & Plan:   Active Problems:   Down's syndrome   GERD (gastroesophageal reflux disease)   Hyperlipidemia   Chronic kidney disease (CKD), stage II (mild)   Pyelonephritis   Acute renal failure (ARF) (HCC)   Orchitis of right testicle   Epididymitis   Chest pain  Sepsis 2/2 to Right Sided Pseudomonas Aeruginosa Pyelonephritis complicated by prominent Hydronephrosis and Hydroureter bilaterally in the setting of Neurogenic Bladder and BPH/Chronic Urethral Stricture -Patient was Febrile, Tachycardic, and Tachypenic and WBC went from 10.3 -> 32.5 -Sepsis Physiology improved -WBC went from 32.5 -> 24.1 -> 14.0 -> 8.5 -CT Abd Showed Prominent hydronephrosis and hydroureter bilaterally. Ureteral  wall thickening and stranding particularly on the right suggesting pyelitis. Bladder is decompressed with a suprapubic catheter, but there is evidence of diffuse bladder wall thickening. Increased density of the urine may indicate concentrated urine or hemorrhage.  -S/p Bolus 4 Liters, D/C'd IVF -Urinalysis showed Cloudy Urine with Many Bacteria, Large Leukocytes, Positive Nitrites, TNTC WBC and TNTC RBC -Urine Cx shows >100,000 of Pseudomonas Aeruginosa Sensitive to Zosyn and Ciprofloxacin  -Change Broad Spectrum Abx with IV Zosyn and IV Vancomycin to IV Ciprofloxacin -Consulted Urology and Appreciate Recc's -They do not feel as if he needs a Nephrostomy tube at this Point and recommending continuing Abx; Will need to follow up with Urology about additional Recc's -Suprapubic Catheter replaced by EDP; C/w Tamsulosin 0.4 mg po every morning -Procalcitonin was 1.85; Lactic Acid Level was 1.2 and repeat Lactic Acid Level was 1.1 -Patient states Abdominal Pain has improved; Pain control with Tramadol 100 mg po q6hprn and Morphine 1-2 mg IV q4hprn for Severe Pain -C/w Acetaminophen 650 mg po q4hprn for Fever, headache, mild pain -PT evaluated and recommend no follow up  Right Epididymitis and Orchitis -Doppler of Testes showed no testicular torsion but did show Right epididymitis and orchits with difuse hyperemia and a small to moderate complex right-side hydroceole with numerous septations and internal echos, likely reflecting the Right sided orchitis -Relative large simple Left-sided Hydrocele seen and a incidental note of a 4 mm cyst at the Left Epididymal Head -Scrotal Sling and Support with Scrotal Elevation -C/w IV Ciprofloxacin -WBC Trending down -Will need to discuss with Urology about any additional reccomendations   Chest Pain r/o ACS -Complained of Chest Pain that resolved -Cycle Troponins -Reviewed EKG and was unchanged from prior in 2013  Down's Syndrome and Cognitive Delay -No  agitation and he appears to be at baseline.   Hypokalemia -Patient's K+ was 3.4 -Replete with Potassium Chloride 40 mEQ po BID -Repeat CMP in AM  Acute on CKD stage 2  -Worsening of creatinine, suspect dehydration and infection.  -BUN/Cr went from 20/2.16 -> 17/2.30 -> 17/2.20 -> 15/2.24 -> 16/2.04 -C/w IVF Rehydration at 100 mL/hr -Avoid Nephrotoxic Medications and will D/C IV Vancomycin and IV Zosyn -Repeat CMP in AM  Mild Normocytic Anemia.  -Patient's Hb/Hct went from 12.9/37.4 -> 10.7/31.9 -> 10.3/30.9 -> 10.7/31.8 -> 10.3/30.9 -Repeat CBC in AM  GERD -C/w Pantoprazole 40 mg po Daily  Hx of DVT and PE -Not on Anticoagulation -No Active Issues; C/w AS 81 mg po Daily  Hyperlipidemia -C/w Simvastatin 40 mg po Daily qHS  Right knee effusion, not able to bear weight Right knee  X ray with moderate effusion, uric acid wnl, IR to drain the effusion in am with fluids analysis, hopefully able to d/c tomorrow after ir drain  DVT prophylaxis: Lovenox 40 mg sq q24h, hold for right knee procedure  Code Status: FULL CODE Family Communication: Discussed with Mother  Disposition Plan: Home in next 24-48 Hours if Medically stable   Consultants:   Urology  IR   Procedures: None   Antimicrobials:  Anti-infectives    Start     Dose/Rate Route Frequency Ordered Stop   05/25/16 1300  ciprofloxacin (CIPRO) tablet 500 mg     500 mg Oral 2 times daily 05/25/16 1055     05/23/16 1600  ciprofloxacin (CIPRO) IVPB 400 mg  Status:  Discontinued     400 mg 200 mL/hr over 60 Minutes Intravenous Every 12 hours 05/23/16 1520 05/25/16 1054   05/21/16 2200  vancomycin (VANCOCIN) IVPB 1000 mg/200 mL premix  Status:  Discontinued     1,000 mg 200 mL/hr over 60 Minutes Intravenous Every 24 hours 05/21/16 0309 05/23/16 1503   05/20/16 2330  vancomycin (VANCOCIN) IVPB 1000 mg/200 mL premix     1,000 mg 200 mL/hr over 60 Minutes Intravenous  Once 05/20/16 2316 05/21/16 0052   05/20/16 1000   piperacillin-tazobactam (ZOSYN) IVPB 3.375 g  Status:  Discontinued     3.375 g 12.5 mL/hr over 240 Minutes Intravenous Every 8 hours 05/20/16 0939 05/23/16 1515   05/20/16 0615  cefTRIAXone (ROCEPHIN) 1 g in dextrose 5 % 50 mL IVPB     1 g 100 mL/hr over 30 Minutes Intravenous  Once 05/20/16 0605 05/20/16 0831     Subjective:  Scrotum less swollen and red , denies scrotal pain c/p right knee pain, not able to bear weight Mother in room.   Objective: Vitals:   05/24/16 1500 05/24/16 1735 05/24/16 2227 05/25/16 0618  BP: 110/72 116/78 119/74 110/73  Pulse: (!) 103 (!) 108 (!) 103 99  Resp: 18 20 18 18   Temp: 99.1 F (37.3 C)  98.8 F (37.1 C) 98.7 F (37.1 C)  TempSrc: Oral  Oral Oral  SpO2: 94% 94% 93% 93%  Weight:      Height:        Intake/Output Summary (Last 24 hours) at 05/25/16 1612 Last data filed at 05/25/16 1118  Gross per 24 hour  Intake             1200 ml  Output             4275 ml  Net            -3075  ml   Filed Weights   05/20/16 0917 05/23/16 0400  Weight: 79.2 kg (174 lb 9.7 oz) 84.1 kg (185 lb 4.8 oz)   Examination: Physical Exam:  Constitutional:  NAD and appears calm and comfortable Eyes: Lids and conjunctivae normal, sclerae anicteric  ENMT: External Ears normal. Grossly normal hearing. Down Syndrome features Neck: Appears normal, supple, no cervical masses, normal ROM, no appreciable thyromegaly Respiratory: Clear to auscultation bilaterally, no wheezing, rales, rhonchi or crackles. Normal respiratory effort and patient is not tachypenic. No accessory muscle use.  Cardiovascular: Slightly Tachycardic Rate and Rhythm, no murmurs / rubs / gallops. S1 and S2 auscultated. Mild extremity edema.   Abdomen: Soft, non-tender, non-distended. No masses palpated. No appreciable hepatosplenomegaly. Bowel sounds positive.  GU: Deferred. Suprapubic Catheter in place draining yellow urine.  Musculoskeletal: No clubbing / cyanosis of digits/nails. Right knee  slightly swollen and warm to touch Skin: No rashes, lesions, ulcers. No induration; Warm and dry.  Neurologic: CN 2-12 grossly intact with no focal deficits. Sensation intact in all 4 Extremities. Romberg sign cerebellar reflexes not assessed.  Psychiatric: Normal judgment and insight. Alert and awake. Normal mood and appropriate affect.   Data Reviewed: I have personally reviewed following labs and imaging studies  CBC:  Recent Labs Lab 05/21/16 1047 05/22/16 0517 05/23/16 0508 05/24/16 0628 05/25/16 0627  WBC 32.5* 24.1* 14.0* 8.5 7.1  NEUTROABS 30.2* 22.0* 12.0* 6.4 5.0  HGB 10.7* 10.3* 10.7* 10.7* 11.6*  HCT 31.9* 30.9* 31.8* 31.7* 33.9*  MCV 99.7 97.2 96.1 94.3 94.7  PLT 198 208 225 217 431   Basic Metabolic Panel:  Recent Labs Lab 05/21/16 1047 05/22/16 0517 05/23/16 0508 05/24/16 0628 05/25/16 0627  NA 139 138 139 141 140  K 4.1 3.9 3.7 3.4* 3.9  CL 114* 111 110 110 107  CO2 22 20* 22 23 26   GLUCOSE 133* 108* 92 107* 109*  BUN 17 17 15 16 16   CREATININE 2.30* 2.20* 2.24* 2.04* 1.89*  CALCIUM 7.6* 7.8* 8.0* 8.1* 8.6*  MG 1.9 1.8 1.8 1.8 1.9  PHOS 2.5 2.4* 2.1* 2.6 2.9   GFR: Estimated Creatinine Clearance: 40.1 mL/min (A) (by C-G formula based on SCr of 1.89 mg/dL (H)). Liver Function Tests:  Recent Labs Lab 05/21/16 1047 05/22/16 0517 05/23/16 0508 05/24/16 0628 05/25/16 0627  AST 18 13* 14* 17 21  ALT 11* 10* 13* 17 26  ALKPHOS 59 58 80 68 66  BILITOT 0.5 0.6 0.8 0.5 0.6  PROT 6.0* 6.0* 6.1* 6.3* 6.8  ALBUMIN 2.1* 2.2* 2.2* 2.2* 2.3*    Recent Labs Lab 05/20/16 0423  LIPASE 14   No results for input(s): AMMONIA in the last 168 hours. Coagulation Profile: No results for input(s): INR, PROTIME in the last 168 hours. Cardiac Enzymes:  Recent Labs Lab 05/24/16 1755 05/24/16 2341 05/25/16 0627  TROPONINI <0.03 <0.03 <0.03   BNP (last 3 results) No results for input(s): PROBNP in the last 8760 hours. HbA1C: No results for input(s):  HGBA1C in the last 72 hours. CBG: No results for input(s): GLUCAP in the last 168 hours. Lipid Profile: No results for input(s): CHOL, HDL, LDLCALC, TRIG, CHOLHDL, LDLDIRECT in the last 72 hours. Thyroid Function Tests: No results for input(s): TSH, T4TOTAL, FREET4, T3FREE, THYROIDAB in the last 72 hours. Anemia Panel: No results for input(s): VITAMINB12, FOLATE, FERRITIN, TIBC, IRON, RETICCTPCT in the last 72 hours. Sepsis Labs:  Recent Labs Lab 05/20/16 1024 05/20/16 2225 05/21/16 1918 05/21/16 2305 05/23/16 5400 05/25/16 8676  PROCALCITON  --   --  1.85  --  0.95 0.35  LATICACIDVEN 1.5 1.2 1.2 1.1  --   --     Recent Results (from the past 240 hour(s))  Urine culture     Status: Abnormal   Collection Time: 05/20/16  3:48 AM  Result Value Ref Range Status   Specimen Description URINE, CLEAN CATCH  Final   Special Requests NONE  Final   Culture >=100,000 COLONIES/mL PSEUDOMONAS AERUGINOSA (A)  Final   Report Status 05/23/2016 FINAL  Final   Organism ID, Bacteria PSEUDOMONAS AERUGINOSA (A)  Final      Susceptibility   Pseudomonas aeruginosa - MIC*    CEFTAZIDIME 32 RESISTANT Resistant     CIPROFLOXACIN <=0.25 SENSITIVE Sensitive     GENTAMICIN <=1 SENSITIVE Sensitive     IMIPENEM 2 SENSITIVE Sensitive     PIP/TAZO 16 SENSITIVE Sensitive     CEFEPIME 32 RESISTANT Resistant     * >=100,000 COLONIES/mL PSEUDOMONAS AERUGINOSA  Culture, blood (Routine X 2) w Reflex to ID Panel     Status: None (Preliminary result)   Collection Time: 05/22/16  9:02 AM  Result Value Ref Range Status   Specimen Description BLOOD LEFT ANTECUBITAL  Final   Special Requests BOTTLES DRAWN AEROBIC AND ANAEROBIC 5CC  Final   Culture   Final    NO GROWTH 3 DAYS Performed at [facility], [address] [place], [place] [address]    Report Status PENDING  Incomplete  Culture, blood (Routine X 2) w Reflex to ID Panel     Status: None (Preliminary result)   Collection Time: 05/22/16  9:06 AM    Result Value Ref Range Status   Specimen Description BLOOD LEFT HAND  Final   Special Requests BOTTLES DRAWN AEROBIC ONLY 5CC  Final   Culture   Final    NO GROWTH 3 DAYS Performed at [facility], [place] [address] [place], [place] [address]    Report Status PENDING  Incomplete    Radiology Studies: Dg Knee 1-2 Views Right  Result Date: 05/25/2016 CLINICAL DATA:  Anterior RIGHT knee pain since yesterday EXAM: RIGHT KNEE - 1-2 VIEW COMPARISON:  12/01/2015 FINDINGS: Borderline osseous demineralization. Joint spaces preserved. Chondrocalcinosis question CPPD/pseudogout. No acute fracture, dislocation, or bone destruction. Moderate knee joint effusion. Soft tissues otherwise unremarkable. IMPRESSION: Moderate knee joint effusion with chondrocalcinosis question CPPD versus pseudogout. No acute abnormalities. Electronically Signed   By: Lavonia Dana M.D.   On: 05/25/2016 13:43   US Scrotum  Result Date: 05/23/2016 CLINICAL DATA:  Acute onset of scrotal swelling.  Initial encounter. EXAM: SCROTAL ULTRASOUND DOPPLER ULTRASOUND OF THE TESTICLES TECHNIQUE: Complete ultrasound examination of the testicles, epididymis, and other scrotal structures was performed. Color and spectral Doppler ultrasound were also utilized to evaluate blood flow to the testicles. COMPARISON:  CT of the abdomen and pelvis performed 05/20/2016 FINDINGS: Right testicle Measurements: 4.0 x 2.9 x 3.0 cm. The right testis is heterogeneous in appearance, with diffuse hyperemia, concerning for right-sided orchitis. No mass or microlithiasis seen. Left testicle Measurements: 3.4 x 1.8 x 2.2 cm. No mass or microlithiasis visualized. Right epididymis: There is asymmetric prominence of the right epididymis, with associated hyperemia, compatible with right-sided epididymitis. Left epididymis:  A 4 mm cyst is noted at the left epididymal head. Hydrocele: A small to moderate complex right-sided hydrocele is seen, with numerous septations and  internal echoes. A relatively large simple left-sided hydrocele is noted. Varicocele:  None  visualized. Pulsed Doppler interrogation of both testes demonstrates normal low resistance arterial and venous waveforms bilaterally. IMPRESSION: 1. No evidence of testicular torsion. 2. Right-sided epididymitis and orchitis, with diffuse hyperemia. 3. Small to moderate complex right-sided hydrocele, with numerous septations and internal echoes, likely reflecting the right-sided orchitis. Relatively large simple left-sided hydrocele seen. 4. Incidental note of a 4 mm cyst at the left epididymal head. Electronically Signed   By: Garald Balding M.D.   On: 05/23/2016 21:04   [place] Art/ven Flow Abd Pelv Doppler  Result Date: 05/23/2016 CLINICAL DATA:  Acute onset of scrotal swelling.  Initial encounter. EXAM: SCROTAL ULTRASOUND DOPPLER ULTRASOUND OF THE TESTICLES TECHNIQUE: Complete ultrasound examination of the testicles, epididymis, and other scrotal structures was performed. Color and spectral Doppler ultrasound were also utilized to evaluate blood flow to the testicles. COMPARISON:  CT of the abdomen and pelvis performed 05/20/2016 FINDINGS: Right testicle Measurements: 4.0 x 2.9 x 3.0 cm. The right testis is heterogeneous in appearance, with diffuse hyperemia, concerning for right-sided orchitis. No mass or microlithiasis seen. Left testicle Measurements: 3.4 x 1.8 x 2.2 cm. No mass or microlithiasis visualized. Right epididymis: There is asymmetric prominence of the right epididymis, with associated hyperemia, compatible with right-sided epididymitis. Left epididymis:  A 4 mm cyst is noted at the left epididymal head. Hydrocele: A small to moderate complex right-sided hydrocele is seen, with numerous septations and internal echoes. A relatively large simple left-sided hydrocele is noted. Varicocele:  None visualized. Pulsed Doppler interrogation of both testes demonstrates normal low resistance arterial and venous  waveforms bilaterally. IMPRESSION: 1. No evidence of testicular torsion. 2. Right-sided epididymitis and orchitis, with diffuse hyperemia. 3. Small to moderate complex right-sided hydrocele, with numerous septations and internal echoes, likely reflecting the right-sided orchitis. Relatively large simple left-sided hydrocele seen. 4. Incidental note of a 4 mm cyst at the left epididymal head. Electronically Signed   By: Garald Balding M.D.   On: 05/23/2016 21:04   Scheduled Meds: . aspirin EC  81 mg Oral Daily  . ciprofloxacin  500 mg Oral BID  . docusate sodium  100 mg Oral QHS  . pantoprazole  40 mg Oral q morning - 10a  . simvastatin  40 mg Oral QHS  . tamsulosin  0.4 mg Oral q morning - 10a   Continuous Infusions:   LOS: 5 days   Alishba Naples, MD PhD [facility] Pager [phone]  If 7PM-7AM, please contact night-coverage [email or web address] Password [email or web address] 05/25/2016, 4:12 PM

## 2016-05-25 NOTE — Progress Notes (Signed)
Pharmacy Antibiotic Note  Frank Levine is a 56 y.o. male admitted on 05/20/2016 with UTI.  Pharmacy has been consulted for ciprofloxacin dosing.  Plan:  Transition from IV Ciprofloxacin to ciprofloxacin 500 mg PO BID   Will sign off at this time.  Please reconsult if a change in clinical status warrants re-evaluation of dosage.     Height: 5\' 1"  (154.9 cm) Weight: 185 lb 4.8 oz (84.1 kg) IBW/kg (Calculated) : 52.3  Temp (24hrs), Avg:98.9 F (37.2 C), Min:98.7 F (37.1 C), Max:99.1 F (37.3 C)   Recent Labs Lab 05/20/16 1024 05/20/16 2225 05/21/16 1047 05/21/16 1918 05/21/16 2305 05/22/16 0517 05/23/16 0508 05/24/16 0628 05/25/16 0627  WBC  --   --  32.5*  --   --  24.1* 14.0* 8.5 7.1  CREATININE  --   --  2.30*  --   --  2.20* 2.24* 2.04* 1.89*  LATICACIDVEN 1.5 1.2  --  1.2 1.1  --   --   --   --     Estimated Creatinine Clearance: 40.1 mL/min (A) (by C-G formula based on SCr of 1.89 mg/dL (H)).    No Known Allergies  Antimicrobials this admission: 3/16: Vanc >> 3/19 3/16 zosyn >> 3/19 3/19 ciprofloxacin    Dose adjustments this admission: ---  Microbiology results: 3/16 UCx: >100K PsA ( R - cefepime, ceftazidime, S - Cipro, gent, imipenem, Zosyn)  3/18: BC x2: NGTD  Thank you for allowing pharmacy to be a part of this patient's care.   Royetta Asal, PharmD, BCPS Pager [phone] 05/25/2016 1:21 PM

## 2016-05-26 ENCOUNTER — Inpatient Hospital Stay (HOSPITAL_COMMUNITY): Payer: Medicare Other

## 2016-05-26 DIAGNOSIS — R6520 Severe sepsis without septic shock: Secondary | ICD-10-CM

## 2016-05-26 DIAGNOSIS — Z9359 Other cystostomy status: Secondary | ICD-10-CM

## 2016-05-26 DIAGNOSIS — A419 Sepsis, unspecified organism: Principal | ICD-10-CM

## 2016-05-26 LAB — BASIC METABOLIC PANEL
Anion gap: 9 (ref 5–15)
BUN: 20 mg/dL (ref 6–20)
CALCIUM: 8.8 mg/dL — AB (ref 8.9–10.3)
CO2: 26 mmol/L (ref 22–32)
CREATININE: 2 mg/dL — AB (ref 0.61–1.24)
Chloride: 104 mmol/L (ref 101–111)
GFR calc Af Amer: 41 mL/min — ABNORMAL LOW (ref >60)
GFR calc non Af Amer: 36 mL/min — ABNORMAL LOW (ref >60)
GLUCOSE: 108 mg/dL — AB (ref 65–99)
Potassium: 3.9 mmol/L (ref 3.5–5.1)
SODIUM: 139 mmol/L (ref 135–145)

## 2016-05-26 LAB — SYNOVIAL CELL COUNT + DIFF, W/ CRYSTALS
CRYSTALS FLUID: NONE SEEN
Monocyte-Macrophage-Synovial Fluid: 19 % — ABNORMAL LOW (ref 50–90)
NEUTROPHIL, SYNOVIAL: 81 % — AB (ref 0–25)
WBC, SYNOVIAL: 34020 /mm3 — AB (ref 0–200)

## 2016-05-26 MED ORDER — LIDOCAINE HCL 1 % IJ SOLN
INTRAMUSCULAR | Status: AC
  Filled 2016-05-26: qty 20

## 2016-05-26 MED ORDER — CIPROFLOXACIN HCL 500 MG PO TABS: 500.0000 mg | ORAL_TABLET | Freq: Two times a day (BID) | ORAL | 0 refills | Status: AC

## 2016-05-26 MED ORDER — IOPAMIDOL (ISOVUE-300) INJECTION 61%
INTRAVENOUS | Status: AC
  Filled 2016-05-26: qty 50

## 2016-05-26 NOTE — Discharge Summary (Signed)
Discharge Summary  Frank Levine VVO:[id number] DOB: 01-07-1961  PCP: Fransisca Kaufmann Dettinger, MD  Admit date: 05/20/2016 Discharge date: 05/26/2016  Time spent: >77mins, more than 50% time spent on coordination of care and patient/family counseling   Recommendations for Outpatient Follow-up:  1. F/u with PMD within a week  for hospital discharge follow up, repeat cbc/bmp at follow up, PMD to follow up on synovial fluids culture result. 2. f/u with urology in two weeks  Discharge Diagnoses:  Active Hospital Problems   Diagnosis Date Noted  . Orchitis of right testicle 05/24/2016  . Epididymitis 05/24/2016  . Chest pain 05/24/2016  . Pyelonephritis 05/20/2016  . Acute renal failure (ARF) ([facility]) 05/20/2016  . Chronic kidney disease (CKD), stage II (mild) 04/30/2015  . Hyperlipidemia 04/01/2014  . GERD (gastroesophageal reflux disease) 04/01/2014  . Down's syndrome 09/27/2011    Resolved Hospital Problems   Diagnosis Date Noted Date Resolved  No resolved problems to display.    Discharge Condition: stable  Diet recommendation: regular diet  Filed Weights   05/20/16 0917 05/23/16 0400  Weight: 79.2 kg (174 lb 9.7 oz) 84.1 kg (185 lb 4.8 oz)    History of present illness:  PCP: Worthy Rancher, MD Patient coming from: Home.   I have personally briefly reviewed patient's old medical records in [place]  Chief Complaint: abdominal pain   HPI: Frank Levine is a 56 y.o. male with medical history significant of chronic cystitis, chronic supra pubic catheter in use, down's syndrome, stage 2 CKD, h/o mental retardation and h/o DVT and PE, not on anticoagulation, bilateral hydronephrosis, presents from home with abdominal pain. On arrival to ED, he was found to have low grade fever, borderline bp parameters and labs significant for creatinine of 2.16, UA positive for leukocytes and nitrates, mucous and multiple bacteria. He also underwent CT abd and pelvis showing bilateral  hydronephrosis, diffuse bladder thickening, increased density of the urine, consistent with pyelonephritis. He was referred to medical service for evaluation and management of pyelonephritis.  He received a dose of rocephin in ED for the infection.   Hospital Course:  Active Problems:   Down's syndrome   GERD (gastroesophageal reflux disease)   Hyperlipidemia   Chronic kidney disease (CKD), stage II (mild)   Pyelonephritis   Acute renal failure (ARF) ([facility])   Orchitis of right testicle   Epididymitis   Chest pain    Sepsis 2/2 to Right Sided Pseudomonas Aeruginosa Pyelonephritis complicated by prominent Hydronephrosis and Hydroureter bilaterally in the setting of Neurogenic Bladder and BPH/Chronic Urethral Stricture -Patient was Febrile, Tachycardic, and Tachypenic and WBC peaked at 32.5 on 3/17, he complaints about abdominal pain on admission --CT Abd Showed Prominent hydronephrosis and hydroureter bilaterally. Ureteral wall thickening and stranding particularly on the right suggesting pyelitis. Bladder is decompressed with a suprapubic catheter, but there is evidence of diffuse bladder wall thickening. Increased density of the urine may indicate concentrated urine or hemorrhage.  --Urine Cx shows >100,000 of Pseudomonas Aeruginosa Sensitive to Zosyn and Ciprofloxacin  -he is treated with  IV Zosyn then  IV Ciprofloxacin --urology consulted who do not feel as if he needs a Nephrostomy tube at this Point and recommending continuing Abx for total of 14days, he need to follow up with urology in two weeks -Suprapubic Catheter replaced by EDP on 3/16; C/w Tamsulosin 0.4 mg po every morning -Procalcitonin was 1.85 on admission, trending down to 0.35 on 3/21, wbc normalized, ab pain resolved, no fever, he is  discharged on oral cipro (3more days)to finish total of 14days abx treatment per urology recommendation and follow up with urology in two weeks ( I spoke to urology Dr Alyson Ingles on 3/22);    Right Epididymitis and Orchitis -Doppler of Testes showed no testicular torsion but did show Right epididymitis and orchits with difuse hyperemia and a small to moderate complex right-side hydroceole with numerous septations and internal echos, likely reflecting the Right sided orchitis -Relative large simple Left-sided Hydrocele seen and a incidental note of a 4 mm cyst at the Left Epididymal Head -Scrotal Sling and Support with Scrotal Elevation -C/w IV Ciprofloxacin -WBC normalized, erythema/edema has resolved, tenderness has resolved -he is discharged on oral cipro and outpatient follow up with urology in two weeks  Hypokalemia -replaced  CKD stage 2  -cr fluctuating but close to baseline --Avoid Nephrotoxic Medications -pmd to repeat basis labs at hospital discharge follow up  Chest Pain r/o ACS -Complained of Chest Pain that resolved -Troponins negative x3 -Reviewed EKG and was unchanged from prior in 2013  Down's Syndrome and Cognitive Delay -No agitation and he appears to be at baseline.  Per mother, he has a part time job at [employer] twice a week   Mild Normocytic Anemia.  -hgb stable at 11-12 -pmd to continue to monitor  GERD -C/w Pantoprazole 40 mg po Daily  Hx of DVT and PE, per hematology note this happened post op, he was treated with total of 76months of anticoagulation -Not on Anticoagulation -No Active Issues; C/w AS 81 mg po Daily  Hyperlipidemia -C/w Simvastatin 40 mg po Daily qHS  Right knee effusion, not able to bear weight Right knee  X ray with moderate effusion, uric acid wnl,  s/p fluroscope guided drain on 3/22 by IR, fluids analysis consistent with inflammatory changes with wbc 34020, no crystals seen Fluids culture is pending at discharge, he is already on abx, pmd to follow up on final synovial fluids culture result. He is discharged home with home health  DVT prophylaxis while in the hospital: Lovenox 40 mg sq q24h, hold for  right knee procedure  Code Status: FULL CODE Family Communication: Discussed with Mother at bedside Disposition Plan: Home with home health   Consultants:   Urology  IR for DG fluroscope guided arthrocentesis to right knee on 3/22   Procedures: None  Antimicrobials:  Zosyn from 3/16 to 3/19 then cipro from 3/19  Discharge Exam: BP (!) 90/59 (BP Location: Left Arm)   Pulse (!) 103   Temp 98.4 F (36.9 C) (Oral)   Resp 18   Ht 5\' 1"  (1.549 m)   Wt 84.1 kg (185 lb 4.8 oz)   SpO2 97%   BMI 35.01 kg/m   General: NAD, down syndrome face Cardiovascular: RRR Respiratory: CTABL GU; suprapubic catheter scrotal edema/erythema has resolved, nontender Right knee edema has resolved post arthrocentesis   Discharge Instructions You were cared for by a hospitalist during your hospital stay. If you have any questions about your discharge medications or the care you received while you were in the hospital after you are discharged, you can call the unit and asked to speak with the hospitalist on call if the hospitalist that took care of you is not available. Once you are discharged, your primary care physician will handle any further medical issues. Please note that NO REFILLS for any discharge medications will be authorized once you are discharged, as it is imperative that you return to your primary care physician (or establish a  relationship with a primary care physician if you do not have one) for your aftercare needs so that they can reassess your need for medications and monitor your lab values.  Discharge Instructions    Face-to-face encounter (required for Medicare/Medicaid patients)    Complete by:  As directed    I Frank Levine certify that this patient is under my care and that I, or a nurse practitioner or physician's assistant working with me, had a face-to-face encounter that meets the physician face-to-face encounter requirements with this patient on 05/25/2016. The encounter with the  patient was in whole, or in part for the following medical condition(s) which is the primary reason for home health care (List medical condition): FTT   The encounter with the patient was in whole, or in part, for the following medical condition, which is the primary reason for home health care:  FTT   I certify that, based on my findings, the following services are medically necessary home health services:   Nursing Physical therapy     Reason for Medically Necessary Home Health Services:  Skilled Nursing- Change/Decline in Patient Status   My clinical findings support the need for the above services:  Unable to leave home safely without assistance and/or assistive device   Further, I certify that my clinical findings support that this patient is homebound due to:  Mental confusion   Home Health    Complete by:  As directed    To provide the following care/treatments:   PT RN Social work       Allergies as of 05/26/2016   No Known Allergies     Medication List    TAKE these medications   aspirin EC 81 MG tablet Take 81 mg by mouth daily.   ciprofloxacin 500 MG tablet Commonly known as:  CIPRO Take 1 tablet (500 mg total) by mouth 2 (two) times daily.   docusate sodium 100 MG capsule Commonly known as:  COLACE Take 100 mg by mouth at bedtime.   pantoprazole 40 MG tablet Commonly known as:  PROTONIX Take 1 Tablet by mouth every morning   simvastatin 40 MG tablet Commonly known as:  ZOCOR Take 1 Tablet by mouth once daily AT 6pm What changed:  how much to take  how to take this  when to take this  additional instructions   tamsulosin 0.4 MG Caps capsule Commonly known as:  FLOMAX Take 1 capsule (0.4 mg total) by mouth every morning.            Durable Medical Equipment        Start     Ordered   05/25/16 1541  For home use only DME Walker rolling  Once    Question:  Patient needs a walker to treat with the following condition  Answer:  Balance problem    05/25/16 1541     No Known Allergies Follow-up Information    Fransisca Kaufmann Dettinger, MD Follow up in 1 week(s).   Specialties:  Family Medicine, Cardiology Why:  hospital discharge follow up, repeat cbc/bmp at follow up  follow up on synovial fluids culture result. Contact information: [place] [address] [phone]        Bernestine Amass, MD Follow up in 2 week(s).   Specialty:  Urology Why:  hospital discharge follow up Contact information: [place] [address] [phone]            The results of significant diagnostics from  this hospitalization (including imaging, microbiology, ancillary and laboratory) are listed below for reference.    Significant Diagnostic Studies: Ct Abdomen Pelvis Wo Contrast  Result Date: 05/20/2016 CLINICAL DATA:  Right lower quadrant pain for several hours. Nausea. White cell count 10.3. Low GFR. History of Down's syndrome and bilateral hydronephrosis. Chronic cystitis. Urethral stricture. Chronic kidney disease. Gastroesophageal reflux disease. Meckel's diverticulum. Neurogenic bladder. EXAM: CT ABDOMEN AND PELVIS WITHOUT CONTRAST TECHNIQUE: Multidetector CT imaging of the abdomen and pelvis was performed following the standard protocol without IV contrast. COMPARISON:  05/04/2012 FINDINGS: Lower chest: Mild dependent atelectasis in the lung bases. Hepatobiliary: Tiny stones layering in the gallbladder. No gallbladder wall thickening or edema. No bile duct dilatation. No focal liver lesions. Pancreas: Fatty infiltration of the pancreas. No acute inflammatory changes. No ductal dilatation. Spleen: Normal in size without focal abnormality. Adrenals/Urinary Tract: No adrenal gland nodules. Prominent bilateral hydronephrosis and hydroureter down to the level of the bladder. Renal atrophy. Thickening of the wall of the ureter, particularly on the right, with stranding suggesting infection. Suprapubic catheter in the bladder.  Prominent diffuse bladder wall thickening suggesting cystitis or bladder outlet obstruction. Increased density of the bladder may indicate concentrated urine or hemorrhage. Correlation with urinalysis is recommended. Bladder diverticulum is decompressed since previous study. Small fat herniation in the suprapubic catheter insertion site. Stomach/Bowel: Stomach and small bowel are unremarkable. There is a ventral abdominal wall hernia containing a loop of small bowel but without proximal obstruction. Scattered stool throughout the colon. No colonic distention or wall thickening. Appendix is normal. Vascular/Lymphatic: Normal caliber abdominal aorta and inferior vena cava. Retroperitoneal lymph nodes are present without pathologic enlargement. Reproductive: Prostate is unremarkable. Other: Small left inguinal hernia containing fat. No free air or free fluid in the abdomen. Musculoskeletal: Degenerative changes in the spine. No destructive bone lesions. IMPRESSION: Prominent hydronephrosis and hydroureter bilaterally. Ureteral wall thickening and stranding particularly on the right suggesting pyelitis. Bladder is decompressed with a suprapubic catheter, but there is evidence of diffuse bladder wall thickening. Increased density of the urine may indicate concentrated urine or hemorrhage. Correlation with urinalysis is recommended. Ventral abdominal wall hernia containing small bowel but without proximal obstruction. Left inguinal hernia containing fat. Small fat herniation at the suprapubic catheter insertion site. Electronically Signed   By: Lucienne Capers M.D.   On: 05/20/2016 05:56   Dg Knee 1-2 Views Right  Result Date: 05/25/2016 CLINICAL DATA:  Anterior RIGHT knee pain since yesterday EXAM: RIGHT KNEE - 1-2 VIEW COMPARISON:  12/01/2015 FINDINGS: Borderline osseous demineralization. Joint spaces preserved. Chondrocalcinosis question CPPD/pseudogout. No acute fracture, dislocation, or bone destruction.  Moderate knee joint effusion. Soft tissues otherwise unremarkable. IMPRESSION: Moderate knee joint effusion with chondrocalcinosis question CPPD versus pseudogout. No acute abnormalities. Electronically Signed   By: Lavonia Dana M.D.   On: 05/25/2016 13:43   US Scrotum  Result Date: 05/23/2016 CLINICAL DATA:  Acute onset of scrotal swelling.  Initial encounter. EXAM: SCROTAL ULTRASOUND DOPPLER ULTRASOUND OF THE TESTICLES TECHNIQUE: Complete ultrasound examination of the testicles, epididymis, and other scrotal structures was performed. Color and spectral Doppler ultrasound were also utilized to evaluate blood flow to the testicles. COMPARISON:  CT of the abdomen and pelvis performed 05/20/2016 FINDINGS: Right testicle Measurements: 4.0 x 2.9 x 3.0 cm. The right testis is heterogeneous in appearance, with diffuse hyperemia, concerning for right-sided orchitis. No mass or microlithiasis seen. Left testicle Measurements: 3.4 x 1.8 x 2.2 cm. No mass or microlithiasis visualized. Right epididymis: There is asymmetric prominence  of the right epididymis, with associated hyperemia, compatible with right-sided epididymitis. Left epididymis:  A 4 mm cyst is noted at the left epididymal head. Hydrocele: A small to moderate complex right-sided hydrocele is seen, with numerous septations and internal echoes. A relatively large simple left-sided hydrocele is noted. Varicocele:  None visualized. Pulsed Doppler interrogation of both testes demonstrates normal low resistance arterial and venous waveforms bilaterally. IMPRESSION: 1. No evidence of testicular torsion. 2. Right-sided epididymitis and orchitis, with diffuse hyperemia. 3. Small to moderate complex right-sided hydrocele, with numerous septations and internal echoes, likely reflecting the right-sided orchitis. Relatively large simple left-sided hydrocele seen. 4. Incidental note of a 4 mm cyst at the left epididymal head. Electronically Signed   By: Garald Balding M.D.    On: 05/23/2016 21:04   [place] Art/ven Flow Abd Pelv Doppler  Result Date: 05/23/2016 CLINICAL DATA:  Acute onset of scrotal swelling.  Initial encounter. EXAM: SCROTAL ULTRASOUND DOPPLER ULTRASOUND OF THE TESTICLES TECHNIQUE: Complete ultrasound examination of the testicles, epididymis, and other scrotal structures was performed. Color and spectral Doppler ultrasound were also utilized to evaluate blood flow to the testicles. COMPARISON:  CT of the abdomen and pelvis performed 05/20/2016 FINDINGS: Right testicle Measurements: 4.0 x 2.9 x 3.0 cm. The right testis is heterogeneous in appearance, with diffuse hyperemia, concerning for right-sided orchitis. No mass or microlithiasis seen. Left testicle Measurements: 3.4 x 1.8 x 2.2 cm. No mass or microlithiasis visualized. Right epididymis: There is asymmetric prominence of the right epididymis, with associated hyperemia, compatible with right-sided epididymitis. Left epididymis:  A 4 mm cyst is noted at the left epididymal head. Hydrocele: A small to moderate complex right-sided hydrocele is seen, with numerous septations and internal echoes. A relatively large simple left-sided hydrocele is noted. Varicocele:  None visualized. Pulsed Doppler interrogation of both testes demonstrates normal low resistance arterial and venous waveforms bilaterally. IMPRESSION: 1. No evidence of testicular torsion. 2. Right-sided epididymitis and orchitis, with diffuse hyperemia. 3. Small to moderate complex right-sided hydrocele, with numerous septations and internal echoes, likely reflecting the right-sided orchitis. Relatively large simple left-sided hydrocele seen. 4. Incidental note of a 4 mm cyst at the left epididymal head. Electronically Signed   By: Garald Balding M.D.   On: 05/23/2016 21:04   Dg Fluoro Guided Needle Plc Aspiration/injection Loc  Result Date: 05/26/2016 CLINICAL DATA:  Acute right knee pain with effusion. EXAM: FLUORO GUIDED NEEDLE PLACEMENT.  Right knee  aspiration CONTRAST:  None FLUOROSCOPY TIME:  Fluoroscopy Time:  0 minutes 6 second Radiation Exposure Index (if provided by the fluoroscopic device): Number of Acquired Spot Images: 0 COMPARISON:  Right knee radiographs 05/25/2016 PROCEDURE: After obtaining informed written consent from the patient, the right knee lateral surface was prepped with Betadine. 1% lidocaine used for local anesthesia. 19 gauge needle was passed into the suprapatellar bursa without difficulty with return of joint fluid. The fluid was yellow and relatively clear. A sample was sent to laboratory for analysis. A total of 50 mL were removed. IMPRESSION: Successful right knee arthrocentesis yielding 50 mL fluid. Electronically Signed   By: Franchot Gallo M.D.   On: 05/26/2016 11:27    Microbiology: Recent Results (from the past 240 hour(s))  Urine culture     Status: Abnormal   Collection Time: 05/20/16  3:48 AM  Result Value Ref Range Status   Specimen Description URINE, CLEAN CATCH  Final   Special Requests NONE  Final   Culture >=100,000 COLONIES/mL PSEUDOMONAS AERUGINOSA (A)  Final  Report Status 05/23/2016 FINAL  Final   Organism ID, Bacteria PSEUDOMONAS AERUGINOSA (A)  Final      Susceptibility   Pseudomonas aeruginosa - MIC*    CEFTAZIDIME 32 RESISTANT Resistant     CIPROFLOXACIN <=0.25 SENSITIVE Sensitive     GENTAMICIN <=1 SENSITIVE Sensitive     IMIPENEM 2 SENSITIVE Sensitive     PIP/TAZO 16 SENSITIVE Sensitive     CEFEPIME 32 RESISTANT Resistant     * >=100,000 COLONIES/mL PSEUDOMONAS AERUGINOSA  Culture, blood (Routine X 2) w Reflex to ID Panel     Status: None (Preliminary result)   Collection Time: 05/22/16  9:02 AM  Result Value Ref Range Status   Specimen Description BLOOD LEFT ANTECUBITAL  Final   Special Requests BOTTLES DRAWN AEROBIC AND ANAEROBIC 5CC  Final   Culture   Final    NO GROWTH 4 DAYS Performed at [facility], [address], [place], [place] [address]    Report Status  PENDING  Incomplete  Culture, blood (Routine X 2) w Reflex to ID Panel     Status: None (Preliminary result)   Collection Time: 05/22/16  9:06 AM  Result Value Ref Range Status   Specimen Description BLOOD LEFT HAND  Final   Special Requests BOTTLES DRAWN AEROBIC ONLY 5CC  Final   Culture   Final    NO GROWTH 4 DAYS Performed at [facility], [place] [address], [place], [place] [address]    Report Status PENDING  Incomplete     Labs: Basic Metabolic Panel:  Recent Labs Lab 05/21/16 1047 05/22/16 0517 05/23/16 0508 05/24/16 0628 05/25/16 0627 05/26/16 0605  NA 139 138 139 141 140 139  K 4.1 3.9 3.7 3.4* 3.9 3.9  CL 114* 111 110 110 107 104  CO2 22 20* 22 23 26 26   GLUCOSE 133* 108* 92 107* 109* 108*  BUN 17 17 15 16 16 20   CREATININE 2.30* 2.20* 2.24* 2.04* 1.89* 2.00*  CALCIUM 7.6* 7.8* 8.0* 8.1* 8.6* 8.8*  MG 1.9 1.8 1.8 1.8 1.9  --   PHOS 2.5 2.4* 2.1* 2.6 2.9  --    Liver Function Tests:  Recent Labs Lab 05/21/16 1047 05/22/16 0517 05/23/16 0508 05/24/16 0628 05/25/16 0627  AST 18 13* 14* 17 21  ALT 11* 10* 13* 17 26  ALKPHOS 59 58 80 68 66  BILITOT 0.5 0.6 0.8 0.5 0.6  PROT 6.0* 6.0* 6.1* 6.3* 6.8  ALBUMIN 2.1* 2.2* 2.2* 2.2* 2.3*    Recent Labs Lab 05/20/16 0423  LIPASE 14   No results for input(s): AMMONIA in the last 168 hours. CBC:  Recent Labs Lab 05/21/16 1047 05/22/16 0517 05/23/16 0508 05/24/16 0628 05/25/16 0627  WBC 32.5* 24.1* 14.0* 8.5 7.1  NEUTROABS 30.2* 22.0* 12.0* 6.4 5.0  HGB 10.7* 10.3* 10.7* 10.7* 11.6*  HCT 31.9* 30.9* 31.8* 31.7* 33.9*  MCV 99.7 97.2 96.1 94.3 94.7  PLT 198 208 225 217 239   Cardiac Enzymes:  Recent Labs Lab 05/24/16 1755 05/24/16 2341 05/25/16 0627  TROPONINI <0.03 <0.03 <0.03   BNP: BNP (last 3 results) No results for input(s): BNP in the last 8760 hours.  ProBNP (last 3 results) No results for input(s): PROBNP in the last 8760 hours.  CBG: No results for input(s): GLUCAP in the  last 168 hours.     SignedFlorencia Reasons MD, PhD  [facility] 05/26/2016, 1:17 PM

## 2016-05-27 ENCOUNTER — Telehealth: Payer: Self-pay | Admitting: *Deleted

## 2016-05-27 ENCOUNTER — Ambulatory Visit: Payer: Medicare Other | Admitting: Family Medicine

## 2016-05-27 LAB — CULTURE, BLOOD (ROUTINE X 2)
CULTURE: NO GROWTH
Culture: NO GROWTH

## 2016-05-27 NOTE — Telephone Encounter (Signed)
Call Completed and Appointment Scheduled: Yes, Date: 06/01/2016 with Dr Dettinger   DISCHARGE INFORMATION Date of Discharge:05/26/2016  Discharge Facility: [facility]  Principal Discharge Diagnosis: pyelonephritis  Patient and/or caregiver is knowledgeable of his/her condition(s) and treatment: Yes  MEDICATION RECONCILIATION Current medication list reviewed with patient:Yes  Outpatient Encounter Prescriptions as of 05/27/2016  Medication Sig  . aspirin EC 81 MG tablet Take 81 mg by mouth daily.   . ciprofloxacin (CIPRO) 500 MG tablet Take 1 tablet (500 mg total) by mouth 2 (two) times daily.  Marland Kitchen docusate sodium (COLACE) 100 MG capsule Take 100 mg by mouth at bedtime.   . pantoprazole (PROTONIX) 40 MG tablet Take 1 Tablet by mouth every morning  . simvastatin (ZOCOR) 40 MG tablet Take 1 Tablet by mouth once daily AT 6pm (Patient taking differently: Take 40 mg by mouth at bedtime. )  . tamsulosin (FLOMAX) 0.4 MG CAPS capsule Take 1 capsule (0.4 mg total) by mouth every morning.   No facility-administered encounter medications on file as of 05/27/2016.     Discharge Medications reviewed and reconciled with current medications.yes  Patient is able to obtain needed medications:Yes  ACTIVITIES OF DAILY LIVING  Is the patient able to perform his/her own ADLs: No.  Patient is receiving home health services: Yes.    PATIENT EDUCATION Questions/Concerns Discussed: pt has some drainage from catheter site, no color, just clear

## 2016-05-29 LAB — BODY FLUID CULTURE: Culture: NO GROWTH

## 2016-05-30 LAB — ANAEROBIC CULTURE

## 2016-06-01 ENCOUNTER — Encounter: Payer: Self-pay | Admitting: Family Medicine

## 2016-06-01 ENCOUNTER — Ambulatory Visit (INDEPENDENT_AMBULATORY_CARE_PROVIDER_SITE_OTHER): Payer: Medicare Other | Admitting: Family Medicine

## 2016-06-01 VITALS — BP 105/66 | HR 102 | Temp 97.6°F | Ht 61.0 in | Wt 169.0 lb

## 2016-06-01 DIAGNOSIS — N179 Acute kidney failure, unspecified: Secondary | ICD-10-CM | POA: Diagnosis not present

## 2016-06-01 DIAGNOSIS — N183 Chronic kidney disease, stage 3 (moderate): Secondary | ICD-10-CM

## 2016-06-01 DIAGNOSIS — N12 Tubulo-interstitial nephritis, not specified as acute or chronic: Secondary | ICD-10-CM | POA: Diagnosis not present

## 2016-06-01 DIAGNOSIS — M25561 Pain in right knee: Secondary | ICD-10-CM

## 2016-06-01 NOTE — Progress Notes (Signed)
BP 105/66   Pulse (!) 102   Temp 97.6 F (36.4 C) (Oral)   Ht _0  (1.549 m)   Wt 169 lb (76.7 kg)   BMI 31.93 kg/m    Subjective:    Patient ID: Frank Levine, male    DOB: [birth year], 56 y.o.   MRN: [id number]  HPI: Frank Levine is a 56 y.o. male presenting on 06/01/2016 for Transitional care (patient hospitalized 05/20/16-05/27/16; was supposed to have therapy in home but has not heard anything; still having right knee pain)   HPI Hospital follow-up 4 pyelonephritis and right knee pain Patient is coming in today for hospital follow-up for right knee pain and pyelonephritis. He was admitted to the hospital on 05/20/2016 and discharged on 05/27/2016. He was admitted because of right lower abdominal pain and flank pain that turned out to be a pyelonephritis and also was found to be slightly dehydrated. During the hospitalization he developed right knee pain about mid way through that became severe and intense and they did labs and took some fluid from the need to do testing on. The fluid from the knee had an elevated white count but the cultures came back negative and there were no crystals so was unlikely to be gout. They did not find it for sure answer as to what happened in the knee except that had a white count that was elevated was inflammatory in nature. He had no specific trauma or incident that started it. Upon discharge the recommended follow-up with [place] and get some physical therapy and follow-up with nephrology and urology for some of the issues that is been having. His abdominal pain and flank pain and urine has cleared up. He still has 2 days more of the ciprofloxacin. His culture for his urine did end up growing Pseudomonas. His knee pain is still bothering him but he says it has slightly improved with Tylenol. The knee pain is anterior and does not radiate anywhere else and is worse with standing or movement or stairs. He denies any giving way or popping or catching of the knee. He  denies any swelling or redness or warmth with the knee. She was also diagnosed with scrotal swelling and epididymitis but that is also since resolved.  Relevant past medical, surgical, family and social history reviewed and updated as indicated. Interim medical history since our last visit reviewed. Allergies and medications reviewed and updated.  Review of Systems  Constitutional: Negative for chills and fever.  Respiratory: Negative for shortness of breath and wheezing.   Cardiovascular: Negative for chest pain and leg swelling.  Gastrointestinal: Negative for abdominal pain.  Genitourinary: Negative for decreased urine volume, dysuria, frequency, hematuria, penile swelling, scrotal swelling and urgency.  Musculoskeletal: Positive for arthralgias. Negative for back pain and gait problem.  Skin: Negative for rash.  All other systems reviewed and are negative.   Per HPI unless specifically indicated above   Allergies as of 06/01/2016   No Known Allergies     Medication List       Accurate as of 06/01/16  1:36 PM. Always use your most recent med list.          aspirin EC 81 MG tablet Take 81 mg by mouth daily.   ciprofloxacin 500 MG tablet Commonly known as:  CIPRO Take 1 tablet (500 mg total) by mouth 2 (two) times daily.   docusate sodium 100 MG capsule Commonly known as:  COLACE Take 100 mg by mouth at bedtime.  pantoprazole 40 MG tablet Commonly known as:  PROTONIX Take 1 Tablet by mouth every morning   simvastatin 40 MG tablet Commonly known as:  ZOCOR Take 1 Tablet by mouth once daily AT 6pm   tamsulosin 0.4 MG Caps capsule Commonly known as:  FLOMAX Take 1 capsule (0.4 mg total) by mouth every morning.          Objective:    BP 105/66   Pulse (!) 102   Temp 97.6 F (36.4 C) (Oral)   Ht _0  (1.549 m)   Wt 169 lb (76.7 kg)   BMI 31.93 kg/m   Wt Readings from Last 3 Encounters:  06/01/16 169 lb (76.7 kg)  05/23/16 185 lb 4.8 oz (84.1 kg)    05/04/16 173 lb 4 oz (78.6 kg)    Physical Exam  Constitutional: He is oriented to person, place, and time. He appears well-developed and well-nourished. No distress.  Eyes: Conjunctivae are normal. No scleral icterus.  Cardiovascular: Normal rate, regular rhythm, normal heart sounds and intact distal pulses.   No murmur heard. Pulmonary/Chest: Effort normal and breath sounds normal. No respiratory distress. He has no wheezes.  Abdominal: Soft. Bowel sounds are normal. He exhibits no distension. There is no tenderness. There is no rebound and no guarding.  Musculoskeletal: Normal range of motion. He exhibits no edema.       Right knee: He exhibits normal range of motion, no swelling, no effusion, no erythema, normal alignment, no LCL laxity, normal patellar mobility, no bony tenderness, normal meniscus and no MCL laxity. Tenderness found. Patellar tendon tenderness noted.  Neurological: He is alert and oriented to person, place, and time. Coordination normal.  Skin: Skin is warm and dry. No rash noted. He is not diaphoretic.  Psychiatric: He has a normal mood and affect. His behavior is normal.  Nursing note and vitals reviewed.   Results for orders placed or performed during the hospital encounter of 05/20/16  Urine culture  Result Value Ref Range   Specimen Description URINE, CLEAN CATCH    Special Requests NONE    Culture >=100,000 COLONIES/mL PSEUDOMONAS AERUGINOSA (A)    Report Status 05/23/2016 FINAL    Organism ID, Bacteria PSEUDOMONAS AERUGINOSA (A)       Susceptibility   Pseudomonas aeruginosa - MIC*    CEFTAZIDIME 32 RESISTANT Resistant     CIPROFLOXACIN <=0.25 SENSITIVE Sensitive     GENTAMICIN <=1 SENSITIVE Sensitive     IMIPENEM 2 SENSITIVE Sensitive     PIP/TAZO 16 SENSITIVE Sensitive     CEFEPIME 32 RESISTANT Resistant     * >=100,000 COLONIES/mL PSEUDOMONAS AERUGINOSA  Culture, blood (Routine X 2) w Reflex to ID Panel  Result Value Ref Range   Specimen  Description BLOOD LEFT ANTECUBITAL    Special Requests BOTTLES DRAWN AEROBIC AND ANAEROBIC 5CC    Culture      NO GROWTH 5 DAYS Performed at [facility] [address] [place], [place] [address]    Report Status 05/27/2016 FINAL   Culture, blood (Routine X 2) w Reflex to ID Panel  Result Value Ref Range   Specimen Description BLOOD LEFT HAND    Special Requests BOTTLES DRAWN AEROBIC ONLY 5CC    Culture      NO GROWTH 5 DAYS Performed at [facility], [address], [place], [place] [address]    Report Status 05/27/2016 FINAL   Anaerobic culture  Result Value Ref Range   Specimen Description SYNOVIAL RIGHT  KNEE    Special Requests NONE    Culture      NO ANAEROBES ISOLATED Performed at [facility], [place] [address], [place], [place] [address]    Report Status 05/30/2016 FINAL   Body fluid culture  Result Value Ref Range   Specimen Description SYNOVIAL RIGHT KNEE    Special Requests NONE    Gram Stain      MODERATE WBC PRESENT, PREDOMINANTLY PMN NO ORGANISMS SEEN    Culture      NO GROWTH 3 DAYS Performed at [facility] [address] [place], [place] [address]    Report Status 05/29/2016 FINAL   CBC with Differential/Platelet  Result Value Ref Range   WBC 10.3 4.0 - 10.5 K/uL   RBC 3.87 (L) 4.22 - 5.81 MIL/uL   Hemoglobin 12.9 (L) 13.0 - 17.0 g/dL   HCT 37.4 (L) 39.0 - 52.0 %   MCV 96.6 78.0 - 100.0 fL   MCH 33.3 26.0 - 34.0 pg   MCHC 34.5 30.0 - 36.0 g/dL   RDW 14.6 11.5 - 15.5 %   Platelets 294 150 - 400 K/uL   Neutrophils Relative % 83 %   Neutro Abs 8.6 (H) 1.7 - 7.7 K/uL   Lymphocytes Relative 8 %   Lymphs Abs 0.9 0.7 - 4.0 K/uL   Monocytes Relative 7 %   Monocytes Absolute 0.7 0.1 - 1.0 K/uL   Eosinophils Relative 1 %   Eosinophils Absolute 0.1 0.0 - 0.7 K/uL   Basophils Relative 1 %   Basophils Absolute 0.1 0.0 - 0.1 K/uL  Urinalysis, Routine w reflex microscopic  Result Value Ref Range   Color, Urine YELLOW YELLOW    APPearance CLOUDY (A) CLEAR   Specific Gravity, Urine 1.008 1.005 - 1.030   pH 8.0 5.0 - 8.0   Glucose, UA NEGATIVE NEGATIVE mg/dL   Hgb urine dipstick SMALL (A) NEGATIVE   Bilirubin Urine NEGATIVE NEGATIVE   Ketones, ur NEGATIVE NEGATIVE mg/dL   Protein, ur 100 (A) NEGATIVE mg/dL   Nitrite POSITIVE (A) NEGATIVE   Leukocytes, UA LARGE (A) NEGATIVE   RBC / HPF TOO NUMEROUS TO COUNT 0 - 5 RBC/hpf   WBC, UA TOO NUMEROUS TO COUNT 0 - 5 WBC/hpf   Bacteria, UA MANY (A) NONE SEEN   Squamous Epithelial / LPF NONE SEEN NONE SEEN   WBC Clumps PRESENT    Mucous PRESENT    Non Squamous Epithelial 0-5 (A) NONE SEEN  Comprehensive metabolic panel  Result Value Ref Range   Sodium 139 135 - 145 mmol/L   Potassium 3.8 3.5 - 5.1 mmol/L   Chloride 109 101 - 111 mmol/L   CO2 23 22 - 32 mmol/L   Glucose, Bld 133 (H) 65 - 99 mg/dL   BUN 20 6 - 20 mg/dL   Creatinine, Ser 2.16 (H) 0.61 - 1.24 mg/dL   Calcium 8.0 (L) 8.9 - 10.3 mg/dL   Total Protein 6.9 6.5 - 8.1 g/dL   Albumin 2.7 (L) 3.5 - 5.0 g/dL   AST 15 15 - 41 U/L   ALT 11 (L) 17 - 63 U/L   Alkaline Phosphatase 59 38 - 126 U/L   Total Bilirubin 0.8 0.3 - 1.2 mg/dL   GFR calc non Af Amer 32 (L) >60 mL/min   GFR calc Af Amer 38 (L) >60 mL/min   Anion gap 7 5 - 15  Lipase, blood  Result Value Ref Range   Lipase 14 11 -  51 U/L  HIV antibody (Routine Testing)  Result Value Ref Range   HIV Screen 4th Generation wRfx Non Reactive Non Reactive  Lactic acid, plasma  Result Value Ref Range   Lactic Acid, Venous 1.5 0.5 - 1.9 mmol/L  Lactic acid, plasma  Result Value Ref Range   Lactic Acid, Venous 1.2 0.5 - 1.9 mmol/L  CBC with Differential/Platelet  Result Value Ref Range   WBC 32.5 (H) 4.0 - 10.5 K/uL   RBC 3.20 (L) 4.22 - 5.81 MIL/uL   Hemoglobin 10.7 (L) 13.0 - 17.0 g/dL   HCT 31.9 (L) 39.0 - 52.0 %   MCV 99.7 78.0 - 100.0 fL   MCH 33.4 26.0 - 34.0 pg   MCHC 33.5 30.0 - 36.0 g/dL   RDW 15.0 11.5 - 15.5 %   Platelets 198 150 - 400  K/uL   Neutrophils Relative % 93 %   Lymphocytes Relative 2 %   Monocytes Relative 5 %   Eosinophils Relative 0 %   Basophils Relative 0 %   Neutro Abs 30.2 (H) 1.7 - 7.7 K/uL   Lymphs Abs 0.7 0.7 - 4.0 K/uL   Monocytes Absolute 1.6 (H) 0.1 - 1.0 K/uL   Eosinophils Absolute 0.0 0.0 - 0.7 K/uL   Basophils Absolute 0.0 0.0 - 0.1 K/uL   RBC Morphology POLYCHROMASIA PRESENT    WBC Morphology INCREASED BANDS (>20% BANDS)    Smear Review LARGE PLATELETS PRESENT   Comprehensive metabolic panel  Result Value Ref Range   Sodium 139 135 - 145 mmol/L   Potassium 4.1 3.5 - 5.1 mmol/L   Chloride 114 (H) 101 - 111 mmol/L   CO2 22 22 - 32 mmol/L   Glucose, Bld 133 (H) 65 - 99 mg/dL   BUN 17 6 - 20 mg/dL   Creatinine, Ser 2.30 (H) 0.61 - 1.24 mg/dL   Calcium 7.6 (L) 8.9 - 10.3 mg/dL   Total Protein 6.0 (L) 6.5 - 8.1 g/dL   Albumin 2.1 (L) 3.5 - 5.0 g/dL   AST 18 15 - 41 U/L   ALT 11 (L) 17 - 63 U/L   Alkaline Phosphatase 59 38 - 126 U/L   Total Bilirubin 0.5 0.3 - 1.2 mg/dL   GFR calc non Af Amer 30 (L) >60 mL/min   GFR calc Af Amer 35 (L) >60 mL/min   Anion gap 3 (L) 5 - 15  Magnesium  Result Value Ref Range   Magnesium 1.9 1.7 - 2.4 mg/dL  Phosphorus  Result Value Ref Range   Phosphorus 2.5 2.5 - 4.6 mg/dL  CBC with Differential/Platelet  Result Value Ref Range   WBC 24.1 (H) 4.0 - 10.5 K/uL   RBC 3.18 (L) 4.22 - 5.81 MIL/uL   Hemoglobin 10.3 (L) 13.0 - 17.0 g/dL   HCT 30.9 (L) 39.0 - 52.0 %   MCV 97.2 78.0 - 100.0 fL   MCH 32.4 26.0 - 34.0 pg   MCHC 33.3 30.0 - 36.0 g/dL   RDW 15.0 11.5 - 15.5 %   Platelets 208 150 - 400 K/uL   Neutrophils Relative % 92 %   Neutro Abs 22.0 (H) 1.7 - 7.7 K/uL   Lymphocytes Relative 3 %   Lymphs Abs 0.7 0.7 - 4.0 K/uL   Monocytes Relative 4 %   Monocytes Absolute 1.0 0.1 - 1.0 K/uL   Eosinophils Relative 1 %   Eosinophils Absolute 0.3 0.0 - 0.7 K/uL   Basophils Relative 0 %   Basophils Absolute 0.1  0.0 - 0.1 K/uL  Comprehensive metabolic  panel  Result Value Ref Range   Sodium 138 135 - 145 mmol/L   Potassium 3.9 3.5 - 5.1 mmol/L   Chloride 111 101 - 111 mmol/L   CO2 20 (L) 22 - 32 mmol/L   Glucose, Bld 108 (H) 65 - 99 mg/dL   BUN 17 6 - 20 mg/dL   Creatinine, Ser 2.20 (H) 0.61 - 1.24 mg/dL   Calcium 7.8 (L) 8.9 - 10.3 mg/dL   Total Protein 6.0 (L) 6.5 - 8.1 g/dL   Albumin 2.2 (L) 3.5 - 5.0 g/dL   AST 13 (L) 15 - 41 U/L   ALT 10 (L) 17 - 63 U/L   Alkaline Phosphatase 58 38 - 126 U/L   Total Bilirubin 0.6 0.3 - 1.2 mg/dL   GFR calc non Af Amer 32 (L) >60 mL/min   GFR calc Af Amer 37 (L) >60 mL/min   Anion gap 7 5 - 15  Procalcitonin - Baseline  Result Value Ref Range   Procalcitonin 1.85 ng/mL  Magnesium  Result Value Ref Range   Magnesium 1.8 1.7 - 2.4 mg/dL  Phosphorus  Result Value Ref Range   Phosphorus 2.4 (L) 2.5 - 4.6 mg/dL  Lactic acid, plasma  Result Value Ref Range   Lactic Acid, Venous 1.2 0.5 - 1.9 mmol/L  Lactic acid, plasma  Result Value Ref Range   Lactic Acid, Venous 1.1 0.5 - 1.9 mmol/L  Procalcitonin  Result Value Ref Range   Procalcitonin 0.95 ng/mL  CBC with Differential/Platelet  Result Value Ref Range   WBC 14.0 (H) 4.0 - 10.5 K/uL   RBC 3.31 (L) 4.22 - 5.81 MIL/uL   Hemoglobin 10.7 (L) 13.0 - 17.0 g/dL   HCT 31.8 (L) 39.0 - 52.0 %   MCV 96.1 78.0 - 100.0 fL   MCH 32.3 26.0 - 34.0 pg   MCHC 33.6 30.0 - 36.0 g/dL   RDW 14.8 11.5 - 15.5 %   Platelets 225 150 - 400 K/uL   Neutrophils Relative % 86 %   Neutro Abs 12.0 (H) 1.7 - 7.7 K/uL   Lymphocytes Relative 7 %   Lymphs Abs 1.0 0.7 - 4.0 K/uL   Monocytes Relative 4 %   Monocytes Absolute 0.6 0.1 - 1.0 K/uL   Eosinophils Relative 3 %   Eosinophils Absolute 0.4 0.0 - 0.7 K/uL   Basophils Relative 0 %   Basophils Absolute 0.0 0.0 - 0.1 K/uL  Comprehensive metabolic panel  Result Value Ref Range   Sodium 139 135 - 145 mmol/L   Potassium 3.7 3.5 - 5.1 mmol/L   Chloride 110 101 - 111 mmol/L   CO2 22 22 - 32 mmol/L   Glucose,  Bld 92 65 - 99 mg/dL   BUN 15 6 - 20 mg/dL   Creatinine, Ser 2.24 (H) 0.61 - 1.24 mg/dL   Calcium 8.0 (L) 8.9 - 10.3 mg/dL   Total Protein 6.1 (L) 6.5 - 8.1 g/dL   Albumin 2.2 (L) 3.5 - 5.0 g/dL   AST 14 (L) 15 - 41 U/L   ALT 13 (L) 17 - 63 U/L   Alkaline Phosphatase 80 38 - 126 U/L   Total Bilirubin 0.8 0.3 - 1.2 mg/dL   GFR calc non Af Amer 31 (L) >60 mL/min   GFR calc Af Amer 36 (L) >60 mL/min   Anion gap 7 5 - 15  Magnesium  Result Value Ref Range   Magnesium 1.8 1.7 -  2.4 mg/dL  Phosphorus  Result Value Ref Range   Phosphorus 2.1 (L) 2.5 - 4.6 mg/dL  CBC with Differential/Platelet  Result Value Ref Range   WBC 8.5 4.0 - 10.5 K/uL   RBC 3.36 (L) 4.22 - 5.81 MIL/uL   Hemoglobin 10.7 (L) 13.0 - 17.0 g/dL   HCT 31.7 (L) 39.0 - 52.0 %   MCV 94.3 78.0 - 100.0 fL   MCH 31.8 26.0 - 34.0 pg   MCHC 33.8 30.0 - 36.0 g/dL   RDW 14.5 11.5 - 15.5 %   Platelets 217 150 - 400 K/uL   Neutrophils Relative % 75 %   Neutro Abs 6.4 1.7 - 7.7 K/uL   Lymphocytes Relative 11 %   Lymphs Abs 0.9 0.7 - 4.0 K/uL   Monocytes Relative 10 %   Monocytes Absolute 0.8 0.1 - 1.0 K/uL   Eosinophils Relative 3 %   Eosinophils Absolute 0.3 0.0 - 0.7 K/uL   Basophils Relative 1 %   Basophils Absolute 0.1 0.0 - 0.1 K/uL  Comprehensive metabolic panel  Result Value Ref Range   Sodium 141 135 - 145 mmol/L   Potassium 3.4 (L) 3.5 - 5.1 mmol/L   Chloride 110 101 - 111 mmol/L   CO2 23 22 - 32 mmol/L   Glucose, Bld 107 (H) 65 - 99 mg/dL   BUN 16 6 - 20 mg/dL   Creatinine, Ser 2.04 (H) 0.61 - 1.24 mg/dL   Calcium 8.1 (L) 8.9 - 10.3 mg/dL   Total Protein 6.3 (L) 6.5 - 8.1 g/dL   Albumin 2.2 (L) 3.5 - 5.0 g/dL   AST 17 15 - 41 U/L   ALT 17 17 - 63 U/L   Alkaline Phosphatase 68 38 - 126 U/L   Total Bilirubin 0.5 0.3 - 1.2 mg/dL   GFR calc non Af Amer 35 (L) >60 mL/min   GFR calc Af Amer 40 (L) >60 mL/min   Anion gap 8 5 - 15  Magnesium  Result Value Ref Range   Magnesium 1.8 1.7 - 2.4 mg/dL   Phosphorus  Result Value Ref Range   Phosphorus 2.6 2.5 - 4.6 mg/dL  Procalcitonin  Result Value Ref Range   Procalcitonin 0.35 ng/mL  Troponin I (q 6hr x 3)  Result Value Ref Range   Troponin I <0.03 <0.03 ng/mL  Troponin I (q 6hr x 3)  Result Value Ref Range   Troponin I <0.03 <0.03 ng/mL  Troponin I (q 6hr x 3)  Result Value Ref Range   Troponin I <0.03 <0.03 ng/mL  CBC with Differential/Platelet  Result Value Ref Range   WBC 7.1 4.0 - 10.5 K/uL   RBC 3.58 (L) 4.22 - 5.81 MIL/uL   Hemoglobin 11.6 (L) 13.0 - 17.0 g/dL   HCT 33.9 (L) 39.0 - 52.0 %   MCV 94.7 78.0 - 100.0 fL   MCH 32.4 26.0 - 34.0 pg   MCHC 34.2 30.0 - 36.0 g/dL   RDW 14.4 11.5 - 15.5 %   Platelets 239 150 - 400 K/uL   Neutrophils Relative % 70 %   Neutro Abs 5.0 1.7 - 7.7 K/uL   Lymphocytes Relative 11 %   Lymphs Abs 0.8 0.7 - 4.0 K/uL   Monocytes Relative 14 %   Monocytes Absolute 1.0 0.1 - 1.0 K/uL   Eosinophils Relative 4 %   Eosinophils Absolute 0.3 0.0 - 0.7 K/uL   Basophils Relative 1 %   Basophils Absolute 0.0 0.0 - 0.1 K/uL  Comprehensive metabolic panel  Result Value Ref Range   Sodium 140 135 - 145 mmol/L   Potassium 3.9 3.5 - 5.1 mmol/L   Chloride 107 101 - 111 mmol/L   CO2 26 22 - 32 mmol/L   Glucose, Bld 109 (H) 65 - 99 mg/dL   BUN 16 6 - 20 mg/dL   Creatinine, Ser 1.89 (H) 0.61 - 1.24 mg/dL   Calcium 8.6 (L) 8.9 - 10.3 mg/dL   Total Protein 6.8 6.5 - 8.1 g/dL   Albumin 2.3 (L) 3.5 - 5.0 g/dL   AST 21 15 - 41 U/L   ALT 26 17 - 63 U/L   Alkaline Phosphatase 66 38 - 126 U/L   Total Bilirubin 0.6 0.3 - 1.2 mg/dL   GFR calc non Af Amer 38 (L) >60 mL/min   GFR calc Af Amer 44 (L) >60 mL/min   Anion gap 7 5 - 15  Magnesium  Result Value Ref Range   Magnesium 1.9 1.7 - 2.4 mg/dL  Phosphorus  Result Value Ref Range   Phosphorus 2.9 2.5 - 4.6 mg/dL  Uric acid  Result Value Ref Range   Uric Acid, Serum 5.3 4.4 - 7.6 mg/dL  Basic metabolic panel  Result Value Ref Range   Sodium  139 135 - 145 mmol/L   Potassium 3.9 3.5 - 5.1 mmol/L   Chloride 104 101 - 111 mmol/L   CO2 26 22 - 32 mmol/L   Glucose, Bld 108 (H) 65 - 99 mg/dL   BUN 20 6 - 20 mg/dL   Creatinine, Ser 2.00 (H) 0.61 - 1.24 mg/dL   Calcium 8.8 (L) 8.9 - 10.3 mg/dL   GFR calc non Af Amer 36 (L) >60 mL/min   GFR calc Af Amer 41 (L) >60 mL/min   Anion gap 9 5 - 15  Synovial cell count + diff, w/ crystals  Result Value Ref Range   Color, Synovial YELLOW YELLOW   Appearance-Synovial TURBID (A) CLEAR   Crystals, Fluid NO CRYSTALS SEEN    WBC, Synovial 34,020 (H) 0 - 200 /cu mm   Neutrophil, Synovial 81 (H) 0 - 25 %   Monocyte-Macrophage-Synovial Fluid 19 (L) 50 - 90 %      Assessment & Plan:   Problem List Items Addressed This Visit      Genitourinary   Pyelonephritis - Primary   Relevant Orders   CBC with Differential/Platelet    Other Visit Diagnoses    Acute pain of right knee       recommended 400 mg ibuprofen bid   Relevant Orders   CMP14+EGFR   Acute renal failure superimposed on stage 3 chronic kidney disease, unspecified acute renal failure type (HCC)       Relevant Orders   CBC with Differential/Platelet      The hospital are put in a face-to-face for home physical therapy, we will check in on that process for his knee.  Follow up plan: Return in about 3 months (around 09/01/2016), or if symptoms worsen or fail to improve, for recheck kidney.  Counseling provided for all of the vaccine components Orders Placed This Encounter  Procedures  . CBC with Differential/Platelet  . Thoreau, MD [facility] 06/01/2016, 1:36 PM

## 2016-06-02 LAB — CMP14+EGFR
A/G RATIO: 0.8 — AB (ref 1.2–2.2)
ALT: 39 IU/L (ref 0–44)
AST: 21 IU/L (ref 0–40)
Albumin: 3.3 g/dL — ABNORMAL LOW (ref 3.5–5.5)
Alkaline Phosphatase: 78 IU/L (ref 39–117)
BILIRUBIN TOTAL: 0.3 mg/dL (ref 0.0–1.2)
BUN/Creatinine Ratio: 12 (ref 9–20)
BUN: 25 mg/dL — ABNORMAL HIGH (ref 6–24)
CALCIUM: 8.9 mg/dL (ref 8.7–10.2)
CHLORIDE: 99 mmol/L (ref 96–106)
CO2: 26 mmol/L (ref 18–29)
Creatinine, Ser: 2.12 mg/dL — ABNORMAL HIGH (ref 0.76–1.27)
GFR calc Af Amer: 39 mL/min/{1.73_m2} — ABNORMAL LOW (ref >59)
GFR calc non Af Amer: 34 mL/min/{1.73_m2} — ABNORMAL LOW (ref >59)
Globulin, Total: 3.9 g/dL (ref 1.5–4.5)
Glucose: 89 mg/dL (ref 65–99)
POTASSIUM: 5.3 mmol/L — AB (ref 3.5–5.2)
Sodium: 142 mmol/L (ref 134–144)
Total Protein: 7.2 g/dL (ref 6.0–8.5)

## 2016-06-02 LAB — CBC WITH DIFFERENTIAL/PLATELET
Basophils Absolute: 0.1 10*3/uL (ref 0.0–0.2)
Basos: 2 %
EOS (ABSOLUTE): 0.2 10*3/uL (ref 0.0–0.4)
Eos: 4 %
Hematocrit: 36.9 % — ABNORMAL LOW (ref 37.5–51.0)
Hemoglobin: 12 g/dL — ABNORMAL LOW (ref 13.0–17.7)
IMMATURE GRANULOCYTES: 0 %
Immature Grans (Abs): 0 10*3/uL (ref 0.0–0.1)
LYMPHS: 21 %
Lymphocytes Absolute: 1.1 10*3/uL (ref 0.7–3.1)
MCH: 32.1 pg (ref 26.6–33.0)
MCHC: 32.5 g/dL (ref 31.5–35.7)
MCV: 99 fL — AB (ref 79–97)
MONOS ABS: 0.6 10*3/uL (ref 0.1–0.9)
Monocytes: 12 %
Neutrophils Absolute: 3.2 10*3/uL (ref 1.4–7.0)
Neutrophils: 61 %
PLATELETS: 465 10*3/uL — AB (ref 150–379)
RBC: 3.74 x10E6/uL — ABNORMAL LOW (ref 4.14–5.80)
RDW: 14.9 % (ref 12.3–15.4)
WBC: 5.2 10*3/uL (ref 3.4–10.8)

## 2016-06-06 ENCOUNTER — Telehealth: Payer: Self-pay | Admitting: *Deleted

## 2016-06-06 DIAGNOSIS — M25561 Pain in right knee: Secondary | ICD-10-CM

## 2016-06-06 NOTE — Telephone Encounter (Signed)
Pt having continued knee pain Would like referral to [facility] (here) Please advise

## 2016-06-07 NOTE — Telephone Encounter (Signed)
Go ahead and do referral to orthopedic for knee pain

## 2016-06-08 DIAGNOSIS — N179 Acute kidney failure, unspecified: Secondary | ICD-10-CM | POA: Diagnosis not present

## 2016-06-08 DIAGNOSIS — N390 Urinary tract infection, site not specified: Secondary | ICD-10-CM | POA: Diagnosis not present

## 2016-06-08 DIAGNOSIS — R809 Proteinuria, unspecified: Secondary | ICD-10-CM | POA: Diagnosis not present

## 2016-06-16 DIAGNOSIS — N1339 Other hydronephrosis: Secondary | ICD-10-CM | POA: Diagnosis not present

## 2016-06-20 DIAGNOSIS — Z1159 Encounter for screening for other viral diseases: Secondary | ICD-10-CM | POA: Diagnosis not present

## 2016-06-20 DIAGNOSIS — D509 Iron deficiency anemia, unspecified: Secondary | ICD-10-CM | POA: Diagnosis not present

## 2016-06-20 DIAGNOSIS — I10 Essential (primary) hypertension: Secondary | ICD-10-CM | POA: Diagnosis not present

## 2016-06-20 DIAGNOSIS — N183 Chronic kidney disease, stage 3 (moderate): Secondary | ICD-10-CM | POA: Diagnosis not present

## 2016-06-20 DIAGNOSIS — R809 Proteinuria, unspecified: Secondary | ICD-10-CM | POA: Diagnosis not present

## 2016-06-20 DIAGNOSIS — E559 Vitamin D deficiency, unspecified: Secondary | ICD-10-CM | POA: Diagnosis not present

## 2016-06-20 DIAGNOSIS — Z79899 Other long term (current) drug therapy: Secondary | ICD-10-CM | POA: Diagnosis not present

## 2016-06-22 ENCOUNTER — Encounter: Payer: Self-pay | Admitting: *Deleted

## 2016-06-22 DIAGNOSIS — N359 Urethral stricture, unspecified: Secondary | ICD-10-CM | POA: Diagnosis not present

## 2016-06-23 ENCOUNTER — Other Ambulatory Visit (INDEPENDENT_AMBULATORY_CARE_PROVIDER_SITE_OTHER): Payer: Medicare Other

## 2016-06-23 DIAGNOSIS — M25561 Pain in right knee: Secondary | ICD-10-CM | POA: Diagnosis not present

## 2016-06-28 DIAGNOSIS — B351 Tinea unguium: Secondary | ICD-10-CM | POA: Diagnosis not present

## 2016-06-28 DIAGNOSIS — M79676 Pain in unspecified toe(s): Secondary | ICD-10-CM | POA: Diagnosis not present

## 2016-07-01 ENCOUNTER — Telehealth: Payer: Self-pay | Admitting: Family Medicine

## 2016-07-01 NOTE — Telephone Encounter (Signed)
Note wrote and placed up front- aware.

## 2016-07-01 NOTE — Telephone Encounter (Signed)
Patient can have a work note to return to work as long as he is feeling up to it.

## 2016-07-01 NOTE — Telephone Encounter (Signed)
Please review and advise.

## 2016-07-12 DIAGNOSIS — R809 Proteinuria, unspecified: Secondary | ICD-10-CM | POA: Diagnosis not present

## 2016-07-12 DIAGNOSIS — N179 Acute kidney failure, unspecified: Secondary | ICD-10-CM | POA: Diagnosis not present

## 2016-07-12 DIAGNOSIS — N390 Urinary tract infection, site not specified: Secondary | ICD-10-CM | POA: Diagnosis not present

## 2016-07-18 DIAGNOSIS — N359 Urethral stricture, unspecified: Secondary | ICD-10-CM | POA: Diagnosis not present

## 2016-07-18 DIAGNOSIS — N1330 Unspecified hydronephrosis: Secondary | ICD-10-CM | POA: Diagnosis not present

## 2016-07-18 DIAGNOSIS — R339 Retention of urine, unspecified: Secondary | ICD-10-CM | POA: Diagnosis not present

## 2016-07-18 DIAGNOSIS — N3020 Other chronic cystitis without hematuria: Secondary | ICD-10-CM | POA: Diagnosis not present

## 2016-08-17 ENCOUNTER — Encounter: Payer: Self-pay | Admitting: Physician Assistant

## 2016-08-17 ENCOUNTER — Ambulatory Visit (INDEPENDENT_AMBULATORY_CARE_PROVIDER_SITE_OTHER): Payer: Medicare Other | Admitting: Physician Assistant

## 2016-08-17 VITALS — BP 102/67 | HR 93 | Temp 99.2°F | Ht 61.0 in | Wt 171.2 lb

## 2016-08-17 DIAGNOSIS — R0789 Other chest pain: Secondary | ICD-10-CM | POA: Diagnosis not present

## 2016-08-17 DIAGNOSIS — K439 Ventral hernia without obstruction or gangrene: Secondary | ICD-10-CM | POA: Diagnosis not present

## 2016-08-17 MED ORDER — ACETAMINOPHEN-CODEINE #3 300-30 MG PO TABS: 1.0000 | ORAL_TABLET | Freq: Four times a day (QID) | ORAL | 0 refills | Status: DC | PRN

## 2016-08-17 NOTE — Patient Instructions (Signed)
 Ventral Hernia A ventral hernia is a bulge of tissue from inside the abdomen that pushes through a weak area of the muscles that form the front wall of the abdomen. The tissues inside the abdomen are inside a sac (peritoneum). These tissues include the small intestine, large intestine, and the fatty tissue that covers the intestines (omentum). Sometimes, the bulge that forms a hernia contains intestines. Other hernias contain only fat. Ventral hernias do not go away without surgical treatment. There are several types of ventral hernias. You may have:  A hernia at an incision site from previous abdominal surgery (incisional hernia).  A hernia just above the belly button (epigastric hernia), or at the belly button (umbilical hernia). These types of hernias can develop from heavy lifting or straining.  A hernia that comes and goes (reducible hernia). It may be visible only when you lift or strain. This type of hernia can be pushed back into the abdomen (reduced).  A hernia that traps abdominal tissue inside the hernia (incarcerated hernia). This type of hernia does not reduce.  A hernia that cuts off blood flow to the tissues inside the hernia (strangulated hernia). The tissues can start to die if this happens. This is a very painful bulge that cannot be reduced. A strangulated hernia is a medical emergency.  What are the causes? This condition is caused by abdominal tissue putting pressure on an area of weakness in the abdominal muscles. What increases the risk? The following factors may make you more likely to develop this condition:  Being male.  Being 60 or older.  Being overweight or obese.  Having had previous abdominal surgery, especially if there was an infection after surgery.  Having had an injury to the abdominal wall.  Having had several pregnancies.  Having a buildup of fluid inside the abdomen (ascites).  What are the signs or symptoms? The only symptom of a ventral  hernia may be a painless bulge in the abdomen. A reducible hernia may be visible only when you strain, cough, or lift. Other symptoms may include:  Dull pain.  A feeling of pressure.  Signs and symptoms of a strangulated hernia may include:  Increasing pain.  Nausea and vomiting.  Pain when pressing on the hernia.  The skin over the hernia turning red or purple.  Constipation.  Blood in the stool (feces).  How is this diagnosed? This condition may be diagnosed based on:  Your symptoms.  Your medical history.  A physical exam. You may be asked to cough or strain while standing. These actions increase the pressure inside your abdomen and force the hernia through the opening in your muscles. Your health care provider may try to reduce the hernia by pressing on it.  Imaging studies, such as an ultrasound or CT scan.  How is this treated? This condition is treated with surgery. If you have a strangulated hernia, surgery is done as soon as possible. If your hernia is small and not incarcerated, you may be asked to lose some weight before surgery. Follow these instructions at home:  Follow instructions from your health care provider about eating or drinking restrictions.  If you are overweight, your health care provider may recommend that you increase your activity level and eat a healthier diet.  Do not lift anything that is heavier than 10 lb (4.5 kg).  Return to your normal activities as told by your health care provider. Ask your health care provider what activities are safe for you. You   may need to avoid activities that increase pressure on your hernia.  Take over-the-counter and prescription medicines only as told by your health care provider.  Keep all follow-up visits as told by your health care provider. This is important. Contact a health care provider if:  Your hernia gets larger.  Your hernia becomes painful. Get help right away if:  Your hernia becomes  increasingly painful.  You have pain along with any of the following: ? Changes in skin color in the area of the hernia. ? Nausea. ? Vomiting. ? Fever. Summary  A ventral hernia is a bulge of tissue from inside the abdomen that pushes through a weak area of the muscles that form the front wall of the abdomen.  This condition is treated with surgery, which may be urgent depending on your hernia.  Do not lift anything that is heavier than 10 lb (4.5 kg), and follow activity instructions from your health care provider. This information is not intended to replace advice given to you by your health care provider. Make sure you discuss any questions you have with your health care provider. Document Released: 02/08/2012 Document Revised: 10/09/2015 Document Reviewed: 10/09/2015 Elsevier Interactive Patient Education  2018 [employer]  

## 2016-08-17 NOTE — Progress Notes (Signed)
BP 102/67   Pulse 93   Temp 99.2 F (37.3 C) (Oral)   Ht 5\' 1"  (1.549 m)   Wt 171 lb 3.2 oz (77.7 kg)   SpO2 100%   BMI 32.35 kg/m    Subjective:    Patient ID: Frank Levine, male    DOB: 1960-03-21, 56 y.o.   MRN: [id number]  HPI: Frank Levine is a 56 y.o. male presenting on 08/17/2016 for Gastroesophageal Reflux and chest discomfort  2 days ago the patient began with an episode of severe vomiting and pain. He cannot vomit due to anatomical reasons. But there was severe retching. He had not described any diarrhea. There is not been a severe fever. He complains of upper abdominal chest pain. He denies any changes in reflux or heartburn. No specific food has been aggravating him. He did eat some pizza over the weekend which is a little bit different from his normal diet. He slept in late on Monday after having that episode. And on Tuesday, today, in the middle of the day, his mother came home to find him laying on the floor in pain on his side. He was complaining still of the chest pain. He was brought into our office today. He is still having significant pain in the central chest and upper abdominal area.  EKG had normal findings.  Relevant past medical, surgical, family and social history reviewed and updated as indicated. Allergies and medications reviewed and updated.  Past Medical History:  Diagnosis Date  . Anxiety   . Arthritis   . Bilateral hydronephrosis   . BPH (benign prostatic hypertrophy)   . Chronic cystitis   . Chronic urethral stricture    urologist-  dr Risa Grill  . CKD (chronic kidney disease), stage II   . Down's syndrome 09/27/2011  . GERD (gastroesophageal reflux disease)    OCCASIONAL  . History of DVT of lower extremity    POST GU SURGERY  2013  &  2011  . History of Meckel's diverticulum    BLADDER  . History of pulmonary embolus (PE)    DEC 2011  [place] --  [place]  FOR 6 MONTHS  . Hyperlipemia   . Mental retardation   . Neurogenic bladder   .  Wears glasses     Past Surgical History:  Procedure Laterality Date  . BALLOON DILATION N/A 06/03/2015   Procedure: BALLOON DILATION;  Surgeon: Rana Snare, MD;  Location: [facility];  Service: Urology;  Laterality: N/A;  . CYSTO/  BALLOON DILATION URETHRAL STRICTURE/  REMOVAL & REPLACEMENT RIGHT URETERAL STETN/  RIGHT RETROGRADE PYELOGRAM/   LEFT URETEROSCOPY/  ATTEMPTED REMOVAL LEFT STENT  05-10-2010  . CYSTO/  BALLOON DILATION URETHRAL STRICTURE/  URETEROSCOPY/ REMOVAL LEFT STENT  10-17-2010  . CYSTO/  BALLOON DILATTION URETHRAL STRICTURE/ BILATERAL RETROGRADE PYELOGRAM/ BILATERAL URETERAL STENTS/ BLADDER BX/  LEFT URETEROSCOPY  01-27-2010  . CYSTOGRAM N/A 07/10/2013   Procedure: CYSTOGRAM;  Surgeon: Bernestine Amass, MD;  Location: [facility];  Service: Urology;  Laterality: N/A;  . CYSTOGRAM N/A 07/02/2014   Procedure: CYSTOGRAM;  Surgeon: Rana Snare, MD;  Location: [facility];  Service: Urology;  Laterality: N/A;  . CYSTOSCOPY W/ RETROGRADES Right 06/03/2015   Procedure: CYSTOSCOPY WITH RIGHT RETROGRADE PYELOGRAM CYSTOGRAM;  Surgeon: Rana Snare, MD;  Location: [facility];  Service: Urology;  Laterality: Right;  . CYSTOSCOPY WITH URETHRAL DILATATION N/A 07/10/2013   Procedure: CYSTOSCOPY WITH URETHRAL DILATATION, BALLOON  DILITATION, RIGHT  RETROGRADE;  Surgeon: Bernestine Amass, MD;  Location: [facility];  Service: Urology;  Laterality: N/A;  . CYSTOSCOPY WITH URETHRAL DILATATION N/A 07/02/2014   Procedure: CYSTOSCOPY WITH URETHRAL DILATATION;  Surgeon: Rana Snare, MD;  Location: [facility];  Service: Urology;  Laterality: N/A;  . INSERTION OF SUPRAPUBIC CATHETER N/A 06/03/2015   Procedure: INSERTION OF SUPRAPUBIC CATHETER;  Surgeon: Rana Snare, MD;  Location: [facility];  Service: Urology;  Laterality: N/A;    Review of Systems  Constitutional: Positive for fatigue. Negative for  appetite change and fever.  HENT: Negative.   Eyes: Negative.  Negative for pain and visual disturbance.  Respiratory: Negative.  Negative for cough, chest tightness, shortness of breath and wheezing.   Cardiovascular: Negative.  Negative for chest pain, palpitations and leg swelling.  Gastrointestinal: Positive for abdominal distention, abdominal pain and nausea. Negative for blood in stool, constipation, diarrhea, rectal pain and vomiting.  Endocrine: Negative.   Genitourinary: Negative.   Musculoskeletal: Negative.   Skin: Negative.  Negative for color change and rash.  Neurological: Negative.  Negative for weakness, numbness and headaches.  Psychiatric/Behavioral: Negative.     Allergies as of 08/17/2016   No Known Allergies     Medication List       Accurate as of 08/17/16  4:50 PM. Always use your most recent med list.          acetaminophen-codeine 300-30 MG tablet Commonly known as:  TYLENOL #3 Take 1 tablet by mouth every 6 (six) hours as needed for moderate pain or severe pain.   aspirin EC 81 MG tablet Take 81 mg by mouth daily.   docusate sodium 100 MG capsule Commonly known as:  COLACE Take 100 mg by mouth at bedtime.   ketoconazole 2 % cream Commonly known as:  NIZORAL Apply to affected areas on feet 2 times a day, as directed   pantoprazole 40 MG tablet Commonly known as:  PROTONIX Take 1 Tablet by mouth every morning   simvastatin 40 MG tablet Commonly known as:  ZOCOR Take 1 Tablet by mouth once daily AT 6pm   tamsulosin 0.4 MG Caps capsule Commonly known as:  FLOMAX Take 1 capsule (0.4 mg total) by mouth every morning.          Objective:    BP 102/67   Pulse 93   Temp 99.2 F (37.3 C) (Oral)   Ht 5\' 1"  (1.549 m)   Wt 171 lb 3.2 oz (77.7 kg)   SpO2 100%   BMI 32.35 kg/m   No Known Allergies  Physical Exam  Constitutional: He appears well-nourished. He appears distressed.  Down's gentleman in discomfort, accompanied by his mother.   HENT:  Head: Normocephalic and atraumatic.  Eyes: Conjunctivae and EOM are normal. Pupils are equal, round, and reactive to light.  Neck: Normal range of motion. Neck supple.  Cardiovascular: Normal rate, regular rhythm and normal heart sounds.   Pulmonary/Chest: Effort normal and breath sounds normal.  Abdominal: Soft. Bowel sounds are normal. There is tenderness in the epigastric area. There is negative Murphy's sign. A hernia is present. Hernia confirmed positive in the ventral area.    Rectus diastases present in the upper abdomen, large palpable hernia just superior to umbilicus. Bowel sounds are present. Mild tenderness upon palpation. I did not try to reduce it very much.  Musculoskeletal: Normal range of motion.  Neurological: He is alert.  Skin: Skin is warm and dry.  He is not diaphoretic.        Assessment & Plan:   1. Chest discomfort - EKG 12-Lead  2. Ventral hernia without obstruction or gangrene - acetaminophen-codeine (TYLENOL #3) 300-30 MG tablet; Take 1 tablet by mouth every 6 (six) hours as needed for moderate pain or severe pain.  Dispense: 30 tablet; Refill: 0 - Ambulatory referral to General Surgery   Current Outpatient Prescriptions:  .  aspirin EC 81 MG tablet, Take 81 mg by mouth daily. , Disp: , Rfl:  .  docusate sodium (COLACE) 100 MG capsule, Take 100 mg by mouth at bedtime. , Disp: , Rfl:  .  pantoprazole (PROTONIX) 40 MG tablet, Take 1 Tablet by mouth every morning, Disp: 90 tablet, Rfl: 3 .  simvastatin (ZOCOR) 40 MG tablet, Take 1 Tablet by mouth once daily AT 6pm (Patient taking differently: Take 40 mg by mouth at bedtime. ), Disp: 90 tablet, Rfl: 1 .  tamsulosin (FLOMAX) 0.4 MG CAPS capsule, Take 1 capsule (0.4 mg total) by mouth every morning., Disp: 90 capsule, Rfl: 1 .  acetaminophen-codeine (TYLENOL #3) 300-30 MG tablet, Take 1 tablet by mouth every 6 (six) hours as needed for moderate pain or severe pain., Disp: 30 tablet, Rfl: 0 .   ketoconazole (NIZORAL) 2 % cream, Apply to affected areas on feet 2 times a day, as directed, Disp: , Rfl: 4 Continue all other maintenance medications as listed above.  Follow up plan: Follow-up as needed or worsening of symptoms. Call office for any issues. Go to emergency room if any severe changes in the pain, bowel function or fever  Educational handout given for ventral herrnia  Terald Sleeper PA-C [facility] [address]  [place], [place] [address] [phone]   08/17/2016, 4:50 PM

## 2016-08-18 ENCOUNTER — Telehealth: Payer: Self-pay | Admitting: Family Medicine

## 2016-08-18 NOTE — Telephone Encounter (Signed)
Pt out of work yesterday & today Going back tomorrow

## 2016-08-18 NOTE — Telephone Encounter (Signed)
Whatever they need, and can write for longer

## 2016-08-18 NOTE — Telephone Encounter (Signed)
LMOVM that work note is at front desk ready for pickup

## 2016-08-24 DIAGNOSIS — N359 Urethral stricture, unspecified: Secondary | ICD-10-CM | POA: Diagnosis not present

## 2016-08-27 ENCOUNTER — Other Ambulatory Visit: Payer: Self-pay | Admitting: Family Medicine

## 2016-08-30 DIAGNOSIS — M79676 Pain in unspecified toe(s): Secondary | ICD-10-CM | POA: Diagnosis not present

## 2016-08-30 DIAGNOSIS — B351 Tinea unguium: Secondary | ICD-10-CM | POA: Diagnosis not present

## 2016-09-06 ENCOUNTER — Ambulatory Visit (INDEPENDENT_AMBULATORY_CARE_PROVIDER_SITE_OTHER): Payer: Medicare Other | Admitting: General Surgery

## 2016-09-06 ENCOUNTER — Encounter: Payer: Self-pay | Admitting: General Surgery

## 2016-09-06 VITALS — BP 100/71 | HR 96 | Temp 98.2°F | Resp 18 | Ht 61.0 in | Wt 168.0 lb

## 2016-09-06 DIAGNOSIS — K439 Ventral hernia without obstruction or gangrene: Secondary | ICD-10-CM

## 2016-09-06 NOTE — Patient Instructions (Signed)
 Ventral Hernia A ventral hernia is a bulge of tissue from inside the abdomen that pushes through a weak area of the muscles that form the front wall of the abdomen. The tissues inside the abdomen are inside a sac (peritoneum). These tissues include the small intestine, large intestine, and the fatty tissue that covers the intestines (omentum). Sometimes, the bulge that forms a hernia contains intestines. Other hernias contain only fat. Ventral hernias do not go away without surgical treatment. There are several types of ventral hernias. You may have:  A hernia at an incision site from previous abdominal surgery (incisional hernia).  A hernia just above the belly button (epigastric hernia), or at the belly button (umbilical hernia). These types of hernias can develop from heavy lifting or straining.  A hernia that comes and goes (reducible hernia). It may be visible only when you lift or strain. This type of hernia can be pushed back into the abdomen (reduced).  A hernia that traps abdominal tissue inside the hernia (incarcerated hernia). This type of hernia does not reduce.  A hernia that cuts off blood flow to the tissues inside the hernia (strangulated hernia). The tissues can start to die if this happens. This is a very painful bulge that cannot be reduced. A strangulated hernia is a medical emergency.  What are the causes? This condition is caused by abdominal tissue putting pressure on an area of weakness in the abdominal muscles. What increases the risk? The following factors may make you more likely to develop this condition:  Being male.  Being 60 or older.  Being overweight or obese.  Having had previous abdominal surgery, especially if there was an infection after surgery.  Having had an injury to the abdominal wall.  Having had several pregnancies.  Having a buildup of fluid inside the abdomen (ascites).  What are the signs or symptoms? The only symptom of a ventral  hernia may be a painless bulge in the abdomen. A reducible hernia may be visible only when you strain, cough, or lift. Other symptoms may include:  Dull pain.  A feeling of pressure.  Signs and symptoms of a strangulated hernia may include:  Increasing pain.  Nausea and vomiting.  Pain when pressing on the hernia.  The skin over the hernia turning red or purple.  Constipation.  Blood in the stool (feces).  How is this diagnosed? This condition may be diagnosed based on:  Your symptoms.  Your medical history.  A physical exam. You may be asked to cough or strain while standing. These actions increase the pressure inside your abdomen and force the hernia through the opening in your muscles. Your health care provider may try to reduce the hernia by pressing on it.  Imaging studies, such as an ultrasound or CT scan.  How is this treated? This condition is treated with surgery. If you have a strangulated hernia, surgery is done as soon as possible. If your hernia is small and not incarcerated, you may be asked to lose some weight before surgery. Follow these instructions at home:  Follow instructions from your health care provider about eating or drinking restrictions.  If you are overweight, your health care provider may recommend that you increase your activity level and eat a healthier diet.  Do not lift anything that is heavier than 10 lb (4.5 kg).  Return to your normal activities as told by your health care provider. Ask your health care provider what activities are safe for you. You   may need to avoid activities that increase pressure on your hernia.  Take over-the-counter and prescription medicines only as told by your health care provider.  Keep all follow-up visits as told by your health care provider. This is important. Contact a health care provider if:  Your hernia gets larger.  Your hernia becomes painful. Get help right away if:  Your hernia becomes  increasingly painful.  You have pain along with any of the following: ? Changes in skin color in the area of the hernia. ? Nausea. ? Vomiting. ? Fever. Summary  A ventral hernia is a bulge of tissue from inside the abdomen that pushes through a weak area of the muscles that form the front wall of the abdomen.  This condition is treated with surgery, which may be urgent depending on your hernia.  Do not lift anything that is heavier than 10 lb (4.5 kg), and follow activity instructions from your health care provider. This information is not intended to replace advice given to you by your health care provider. Make sure you discuss any questions you have with your health care provider. Document Released: 02/08/2012 Document Revised: 10/09/2015 Document Reviewed: 10/09/2015 Elsevier Interactive Patient Education  2018 [employer]  

## 2016-09-07 NOTE — Progress Notes (Signed)
Frank Levine; [id number]; 09/12/1960   HPI Patient is a 56 year old white male who was referred to my care by [name] for evaluation and treatment of a ventral hernia. Patient does have Down's syndrome, but he is very active and works. He has had nonspecific upper abdominal and chest pain that is not associated with the swelling. The swelling is made worse when he coughs or strains. He has not had emesis. He has had urologic surgery in the past. He currently has no pain. Past Medical History:  Diagnosis Date  . Anxiety   . Arthritis   . Bilateral hydronephrosis   . BPH (benign prostatic hypertrophy)   . Chronic cystitis   . Chronic urethral stricture    urologist-  dr Risa Grill  . CKD (chronic kidney disease), stage II   . Down's syndrome 09/27/2011  . GERD (gastroesophageal reflux disease)    OCCASIONAL  . History of DVT of lower extremity    POST GU SURGERY  2013  &  2011  . History of Meckel's diverticulum    BLADDER  . History of pulmonary embolus (PE)    DEC 2011  [place] --  [place]  FOR 6 [place]TH[place]  . Hyperlipemia   . Mental retardation   . Neurogenic bladder   . Wears glasses     Past Surgical History:  Procedure Laterality Date  . BALLOON DILATION N/A 06/03/2015   Procedure: BALLOON DILATION;  Surgeon: Rana Snare, MD;  Location: [facility];  Service: Urology;  Laterality: N/A;  . CYSTO/  BALLOON DILATION URETHRAL STRICTURE/  REMOVAL & REPLACEMENT RIGHT URETERAL STETN/  RIGHT RETROGRADE PYELOGRAM/   LEFT URETEROSCOPY/  ATTEMPTED REMOVAL LEFT STENT  05-10-2010  . CYSTO/  BALLOON DILATION URETHRAL STRICTURE/  URETEROSCOPY/ REMOVAL LEFT STENT  10-17-2010  . CYSTO/  BALLOON DILATTION URETHRAL STRICTURE/ BILATERAL RETROGRADE PYELOGRAM/ BILATERAL URETERAL STENTS/ BLADDER BX/  LEFT URETEROSCOPY  01-27-2010  . CYSTOGRAM N/A 07/10/2013   Procedure: CYSTOGRAM;  Surgeon: Bernestine Amass, MD;  Location: [facility];  Service: Urology;  Laterality:  N/A;  . CYSTOGRAM N/A 07/02/2014   Procedure: CYSTOGRAM;  Surgeon: Rana Snare, MD;  Location: [facility];  Service: Urology;  Laterality: N/A;  . CYSTOSCOPY W/ RETROGRADES Right 06/03/2015   Procedure: CYSTOSCOPY WITH RIGHT RETROGRADE PYELOGRAM CYSTOGRAM;  Surgeon: Rana Snare, MD;  Location: [facility];  Service: Urology;  Laterality: Right;  . CYSTOSCOPY WITH URETHRAL DILATATION N/A 07/10/2013   Procedure: CYSTOSCOPY WITH URETHRAL DILATATION, BALLOON DILITATION, RIGHT  RETROGRADE;  Surgeon: Bernestine Amass, MD;  Location: [facility];  Service: Urology;  Laterality: N/A;  . CYSTOSCOPY WITH URETHRAL DILATATION N/A 07/02/2014   Procedure: CYSTOSCOPY WITH URETHRAL DILATATION;  Surgeon: Rana Snare, MD;  Location: [facility];  Service: Urology;  Laterality: N/A;  . INSERTION OF SUPRAPUBIC CATHETER N/A 06/03/2015   Procedure: INSERTION OF SUPRAPUBIC CATHETER;  Surgeon: Rana Snare, MD;  Location: [facility];  Service: Urology;  Laterality: N/A;    Family History  Problem Relation Age of Onset  . Hypertension Mother   . Hyperlipidemia Mother   . Cancer Father     Current Outpatient Prescriptions on File Prior to Visit  Medication Sig Dispense Refill  . acetaminophen-codeine (TYLENOL #3) 300-30 MG tablet Take 1 tablet by mouth every 6 (six) hours as needed for moderate pain or severe pain. 30 tablet 0  . aspirin EC 81 MG tablet Take 81 mg by  mouth daily.     Marland Kitchen docusate sodium (COLACE) 100 MG capsule Take 100 mg by mouth at bedtime.     Marland Kitchen ketoconazole (NIZORAL) 2 % cream Apply to affected areas on feet 2 times a day, as directed  4  . pantoprazole (PROTONIX) 40 MG tablet Take 1 Tablet by mouth every morning 90 tablet 3  . simvastatin (ZOCOR) 40 MG tablet Take 1 tablet by mouth once daily at 6 PM 90 tablet 1  . tamsulosin (FLOMAX) 0.4 MG CAPS capsule Take 1 Capsule by mouth every morning 90 capsule 1   No current  facility-administered medications on file prior to visit.     No Known Allergies  History  Alcohol Use No    History  Smoking Status  . Never Smoker  Smokeless Tobacco  . Never Used    Review of Systems  Constitutional: Negative.   HENT: Negative.   Eyes: Negative.   Respiratory: Negative.   Cardiovascular: Negative.   Gastrointestinal: Negative.   Genitourinary: Negative.   Musculoskeletal: Negative.   Skin: Negative.   Neurological: Negative.   Endo/Heme/Allergies: Negative.   Psychiatric/Behavioral: Negative.     Objective   Vitals:   09/06/16 1134  BP: 100/71  Pulse: 96  Resp: 18  Temp: 98.2 F (36.8 C)    Physical Exam  Constitutional: He is well-developed, well-nourished, and in no distress.  HENT:  Head: Atraumatic.  Cardiovascular: Normal rate, regular rhythm and normal heart sounds.   No murmur heard. Pulmonary/Chest: Effort normal and breath sounds normal. He has no wheezes. He has no rales.  Abdominal: Soft. Bowel sounds are normal. He exhibits no distension. There is no tenderness.  Diastases recti present with a supraumbilical ventral hernia which is greater than 5 cm in diameter. Bowel is easily reducible.  Vitals reviewed.   Assessment  Ventral hernia with diastases recti Plan   At this point, there is no need for a ventral herniorrhaphy with mesh as the risk of incarceration is low. The patient does not seem to have any significant pain associated with this. I did discuss this with the patient and family, who understand and agree. Follow-up as needed.

## 2016-09-14 DIAGNOSIS — H40033 Anatomical narrow angle, bilateral: Secondary | ICD-10-CM | POA: Diagnosis not present

## 2016-09-14 DIAGNOSIS — G44219 Episodic tension-type headache, not intractable: Secondary | ICD-10-CM | POA: Diagnosis not present

## 2016-09-20 ENCOUNTER — Ambulatory Visit: Payer: Medicare Other | Admitting: General Surgery

## 2016-09-21 DIAGNOSIS — N359 Urethral stricture, unspecified: Secondary | ICD-10-CM | POA: Diagnosis not present

## 2016-10-25 DIAGNOSIS — N359 Urethral stricture, unspecified: Secondary | ICD-10-CM | POA: Diagnosis not present

## 2016-10-29 DIAGNOSIS — N183 Chronic kidney disease, stage 3 (moderate): Secondary | ICD-10-CM | POA: Diagnosis not present

## 2016-10-29 DIAGNOSIS — Z79899 Other long term (current) drug therapy: Secondary | ICD-10-CM | POA: Diagnosis not present

## 2016-10-29 DIAGNOSIS — D509 Iron deficiency anemia, unspecified: Secondary | ICD-10-CM | POA: Diagnosis not present

## 2016-10-29 DIAGNOSIS — R809 Proteinuria, unspecified: Secondary | ICD-10-CM | POA: Diagnosis not present

## 2016-10-29 DIAGNOSIS — I10 Essential (primary) hypertension: Secondary | ICD-10-CM | POA: Diagnosis not present

## 2016-10-29 DIAGNOSIS — E559 Vitamin D deficiency, unspecified: Secondary | ICD-10-CM | POA: Diagnosis not present

## 2016-11-01 DIAGNOSIS — N390 Urinary tract infection, site not specified: Secondary | ICD-10-CM | POA: Diagnosis not present

## 2016-11-01 DIAGNOSIS — R809 Proteinuria, unspecified: Secondary | ICD-10-CM | POA: Diagnosis not present

## 2016-11-01 DIAGNOSIS — N183 Chronic kidney disease, stage 3 (moderate): Secondary | ICD-10-CM | POA: Diagnosis not present

## 2016-11-01 DIAGNOSIS — E559 Vitamin D deficiency, unspecified: Secondary | ICD-10-CM | POA: Diagnosis not present

## 2016-11-02 ENCOUNTER — Ambulatory Visit (INDEPENDENT_AMBULATORY_CARE_PROVIDER_SITE_OTHER): Payer: Medicare Other | Admitting: Family Medicine

## 2016-11-02 ENCOUNTER — Encounter: Payer: Self-pay | Admitting: Family Medicine

## 2016-11-02 VITALS — BP 93/62 | HR 86 | Temp 99.7°F | Ht 61.0 in | Wt 174.0 lb

## 2016-11-02 DIAGNOSIS — N182 Chronic kidney disease, stage 2 (mild): Secondary | ICD-10-CM | POA: Diagnosis not present

## 2016-11-02 DIAGNOSIS — E875 Hyperkalemia: Secondary | ICD-10-CM | POA: Diagnosis not present

## 2016-11-02 DIAGNOSIS — E785 Hyperlipidemia, unspecified: Secondary | ICD-10-CM

## 2016-11-02 DIAGNOSIS — K219 Gastro-esophageal reflux disease without esophagitis: Secondary | ICD-10-CM

## 2016-11-02 DIAGNOSIS — M7989 Other specified soft tissue disorders: Secondary | ICD-10-CM | POA: Diagnosis not present

## 2016-11-02 NOTE — Progress Notes (Signed)
BP 93/62   Pulse 86   Temp 99.7 F (37.6 C) (Oral)   Ht '5\' 1"'$  (1.549 m)   Wt 174 lb (78.9 kg)   BMI 32.88 kg/m    Subjective:    Patient ID: Frank Levine, male    DOB: 1960/04/28, 56 y.o.   MRN: [id number]  HPI: Frank Levine is a 56 y.o. male presenting on 11/02/2016 for Hyperlipidemia (6 mo follow up; potassium has been elevated, coming in morning for labwork) and Chronic Kidney Disease   HPI Hyperlipidemia Patient is coming in for recheck of his hyperlipidemia. The patient is currently taking Simvastatin. They deny any issues with myalgias or history of liver damage from it. They deny any focal numbness or weakness or chest pain.   GERD Patient is currently on Protonix.  She denies any major symptoms or abdominal pain or belching or burping. She denies any blood in her stool or lightheadedness or dizziness.   Stage II CKD and hyperkalemia Patient is coming in for recheck of stage II chronic kidney disease. It has been improving recently but we have been continuing to monitor and he does see nephrology. He denies any decreased urine. His mother is here with him as the patient does have Down syndrome and she agrees with this.  Right leg swelling Patient has been having right leg swelling that he attributes to his knee but we will send him for ultrasound just to be sure that there is no clots. He sees Dr. Wynelle Link tomorrow for his orthopedic appointment. He does have some pain in his knee.  Relevant past medical, surgical, family and social history reviewed and updated as indicated. Interim medical history since our last visit reviewed. Allergies and medications reviewed and updated.  Review of Systems  Constitutional: Negative for chills and fever.  Respiratory: Negative for shortness of breath and wheezing.   Cardiovascular: Positive for leg swelling. Negative for chest pain.  Gastrointestinal: Negative for abdominal pain, diarrhea, nausea and vomiting.  Musculoskeletal:  Negative for back pain and gait problem.  Skin: Negative for rash.  Neurological: Negative for dizziness, weakness, light-headedness and numbness.  All other systems reviewed and are negative.   Per HPI unless specifically indicated above     Objective:    BP 93/62   Pulse 86   Temp 99.7 F (37.6 C) (Oral)   Ht '5\' 1"'$  (1.549 m)   Wt 174 lb (78.9 kg)   BMI 32.88 kg/m   Wt Readings from Last 3 Encounters:  11/02/16 174 lb (78.9 kg)  09/06/16 168 lb (76.2 kg)  08/17/16 171 lb 3.2 oz (77.7 kg)    Physical Exam  Constitutional: He is oriented to person, place, and time. He appears well-developed and well-nourished. No distress.  Eyes: Conjunctivae are normal. No scleral icterus.  Neck: Neck supple. No thyromegaly present.  Cardiovascular: Normal rate, regular rhythm, normal heart sounds and intact distal pulses.   No murmur heard. Pulmonary/Chest: Effort normal and breath sounds normal. No respiratory distress. He has no wheezes.  Musculoskeletal: Normal range of motion. He exhibits tenderness (Right leg swelling, 1+). He exhibits no edema.  Lymphadenopathy:    He has no cervical adenopathy.  Neurological: He is alert and oriented to person, place, and time. Coordination normal.  Skin: Skin is warm and dry. No rash noted. He is not diaphoretic.  Psychiatric: He has a normal mood and affect. His behavior is normal.  Nursing note and vitals reviewed.      Assessment &  Plan:   Problem List Items Addressed This Visit      Digestive   GERD (gastroesophageal reflux disease)     Genitourinary   CKD (chronic kidney disease) - Primary   Relevant Orders   CMP14+EGFR (Completed)     Other   Hyperlipidemia   Relevant Orders   Lipid panel (Completed)    Other Visit Diagnoses    Hyperkalemia       Relevant Orders   CMP14+EGFR (Completed)   Right leg swelling       Relevant Orders   US Venous Img Lower Unilateral Right (Completed)       Follow up plan: Return in about  6 months (around 05/04/2017), or if symptoms worsen or fail to improve.  Counseling provided for all of the vaccine components No orders of the defined types were placed in this encounter.   Caryl Pina, MD [facility] 11/02/2016, 2:16 PM

## 2016-11-02 NOTE — Patient Instructions (Signed)
Hyperkalemia Hyperkalemia is when you have too much potassium in your blood. Potassium is normally removed (excreted) from your body by your kidneys. If there is too much potassium in your blood, it can affect how your heart works. Follow these instructions at home:  Take medicines only as told by your doctor.  Do not take any supplements, natural products, herbs, or vitamins unless your doctor says it is okay.  Limit your alcohol intake as told by your doctor.  Stop illegal drug use. If you need help quitting, ask your doctor.  Keep all follow-up visits as told by your doctor. This is important.  If you have kidney disease, you may need to follow a low potassium diet. A food specialist (dietitian) can help you. Contact a doctor if:  Your heartbeat is not regular or very slow.  You feel dizzy (light-headed).  You feel weak.  You feel sick to your stomach (nauseous).  You have tingling in your hands or feet. You cannot feel your hands or feet.Potassium Content of Foods Potassium is a mineral found in many foods and drinks. It helps keep fluids and minerals balanced in your body and affects how steadily your heart beats. Potassium also helps control your blood pressure and keep your muscles and nervous system healthy. Certain health conditions and medicines may change the balance of potassium in your body. When this happens, you can help balance your level of potassium through the foods that you do or do not eat. Your health care provider or dietitian may recommend an amount of potassium that you should have each day. The following lists of foods provide the amount of potassium (in parentheses) per serving in each item. High in potassium The following foods and beverages have 200 mg or more of potassium per serving: Apricots, 2 raw or 5 dry (200 mg). Artichoke, 1 medium (345 mg). Avocado, raw,  each (245 mg). Banana, 1 medium (425 mg). Beans, lima, or baked beans, canned,  cup (280  mg). Beans, white, canned,  cup (595 mg). Beef roast, 3 oz (320 mg). Beef, ground, 3 oz (270 mg). Beets, raw or cooked,  cup (260 mg). Bran muffin, 2 oz (300 mg). Broccoli,  cup (230 mg). Brussels sprouts,  cup (250 mg). Cantaloupe,  cup (215 mg). Cereal, 100% bran,  cup (200-400 mg). Cheeseburger, single, fast food, 1 each (225-400 mg). Chicken, 3 oz (220 mg). Clams, canned, 3 oz (535 mg). Crab, 3 oz (225 mg). Dates, 5 each (270 mg). Dried beans and peas,  cup (300-475 mg). Figs, dried, 2 each (260 mg). Fish: halibut, tuna, cod, snapper, 3 oz (480 mg). Fish: salmon, haddock, swordfish, perch, 3 oz (300 mg). Fish, tuna, canned 3 oz (200 mg). [place] fries, fast food, 3 oz (470 mg). Granola with fruit and nuts,  cup (200 mg). Grapefruit juice,  cup (200 mg). Greens, beet,  cup (655 mg). Honeydew melon,  cup (200 mg). Kale, raw, 1 cup (300 mg). Kiwi, 1 medium (240 mg). Kohlrabi, rutabaga, parsnips,  cup (280 mg). Lentils,  cup (365 mg). Mango, 1 each (325 mg). Milk, chocolate, 1 cup (420 mg). Milk: nonfat, low-fat, whole, buttermilk, 1 cup (350-380 mg). Molasses, 1 Tbsp (295 mg). Mushrooms,  cup (280) mg. Nectarine, 1 each (275 mg). Nuts: almonds, peanuts, hazelnuts, [place], cashew, mixed, 1 oz (200 mg). Nuts, pistachios, 1 oz (295 mg). Orange, 1 each (240 mg). Orange juice,  cup (235 mg). Papaya, medium,  fruit (390 mg). Peanut butter, chunky, 2 Tbsp (240 mg).  Peanut butter, smooth, 2 Tbsp (210 mg). Pear, 1 medium (200 mg). Pomegranate, 1 whole (400 mg). Pomegranate juice,  cup (215 mg). Pork, 3 oz (350 mg). Potato chips, salted, 1 oz (465 mg). Potato, baked with skin, 1 medium (925 mg). Potatoes, boiled,  cup (255 mg). Potatoes, mashed,  cup (330 mg). Prune juice,  cup (370 mg). Prunes, 5 each (305 mg). Pudding, chocolate,  cup (230 mg). Pumpkin, canned,  cup (250 mg). Raisins, seedless,  cup (270 mg). Seeds, sunflower or pumpkin, 1 oz (240  mg). Soy milk, 1 cup (300 mg). Spinach,  cup (420 mg). Spinach, canned,  cup (370 mg). Sweet potato, baked with skin, 1 medium (450 mg). Swiss chard,  cup (480 mg). Tomato or vegetable juice,  cup (275 mg). Tomato sauce or puree,  cup (400-550 mg). Tomato, raw, 1 medium (290 mg). Tomatoes, canned,  cup (200-300 mg). [place], 3 oz (250 mg). Wheat germ, 1 oz (250 mg). Winter squash,  cup (250 mg). Yogurt, plain or fruited, 6 oz (260-435 mg). Zucchini,  cup (220 mg).  Moderate in potassium The following foods and beverages have 50-200 mg of potassium per serving: Apple, 1 each (150 mg). Apple juice,  cup (150 mg). Applesauce,  cup (90 mg). Apricot nectar,  cup (140 mg). Asparagus, small spears,  cup or 6 spears (155 mg). Bagel, cinnamon raisin, 1 each (130 mg). Bagel, egg or plain, 4 in., 1 each (70 mg). Beans, green,  cup (90 mg). Beans, yellow,  cup (190 mg). Beer, regular, 12 oz (100 mg). Beets, canned,  cup (125 mg). Blackberries,  cup (115 mg). Blueberries,  cup (60 mg). Bread, whole wheat, 1 slice (70 mg). Broccoli, raw,  cup (145 mg). Cabbage,  cup (150 mg). Carrots, cooked or raw,  cup (180 mg). Cauliflower, raw,  cup (150 mg). Celery, raw,  cup (155 mg). Cereal, bran flakes, cup (120-150 mg). Cheese, cottage,  cup (110 mg). Cherries, 10 each (150 mg). Chocolate, 1 oz bar (165 mg). Coffee, brewed 6 oz (90 mg). Corn,  cup or 1 ear (195 mg). Cucumbers,  cup (80 mg). Egg, large, 1 each (60 mg). Eggplant,  cup (60 mg). Endive, raw, cup (80 mg). English muffin, 1 each (65 mg). Fish, orange roughy, 3 oz (150 mg). Frankfurter, beef or pork, 1 each (75 mg). Fruit cocktail,  cup (115 mg). Grape juice,  cup (170 mg). Grapefruit,  fruit (175 mg). Grapes,  cup (155 mg). Greens: kale, turnip, collard,  cup (110-150 mg). Ice cream or frozen yogurt, chocolate,  cup (175 mg). Ice cream or frozen yogurt, vanilla,  cup (120-150  mg). Lemons, limes, 1 each (80 mg). Lettuce, all types, 1 cup (100 mg). Mixed vegetables,  cup (150 mg). Mushrooms, raw,  cup (110 mg). Nuts: walnuts, pecans, or macadamia, 1 oz (125 mg). Oatmeal,  cup (80 mg). Okra,  cup (110 mg). Onions, raw,  cup (120 mg). Peach, 1 each (185 mg). Peaches, canned,  cup (120 mg). Pears, canned,  cup (120 mg). Peas, green, frozen,  cup (90 mg). Peppers, green,  cup (130 mg). Peppers, red,  cup (160 mg). Pineapple juice,  cup (165 mg). Pineapple, fresh or canned,  cup (100 mg). Plums, 1 each (105 mg). Pudding, vanilla,  cup (150 mg). Raspberries,  cup (90 mg). Rhubarb,  cup (115 mg). Rice, wild,  cup (80 mg). Shrimp, 3 oz (155 mg). Spinach, raw, 1 cup (170 mg). Strawberries,  cup (125 mg). Summer squash  cup (175-200 mg). Swiss  chard, raw, 1 cup (135 mg). Tangerines, 1 each (140 mg). Tea, brewed, 6 oz (65 mg). Turnips,  cup (140 mg). Watermelon,  cup (85 mg). Wine, red, table, 5 oz (180 mg). Wine, white, table, 5 oz (100 mg).  Low in potassium The following foods and beverages have less than 50 mg of potassium per serving. Bread, white, 1 slice (30 mg). Carbonated beverages, 12 oz (less than 5 mg). Cheese, 1 oz (20-30 mg). Cranberries,  cup (45 mg). Cranberry juice cocktail,  cup (20 mg). Fats and oils, 1 Tbsp (less than 5 mg). Hummus, 1 Tbsp (32 mg). Nectar: papaya, mango, or pear,  cup (35 mg). Rice, white or brown,  cup (50 mg). Spaghetti or macaroni,  cup cooked (30 mg). Tortilla, flour or corn, 1 each (50 mg). Waffle, 4 in., 1 each (50 mg). Water chestnuts,  cup (40 mg).  This information is not intended to replace advice given to you by your health care provider. Make sure you discuss any questions you have with your health care provider. Document Released: 10/05/2004 Document Revised: 07/30/2015 Document Reviewed: 01/18/2013 Elsevier Interactive Patient Education  2018 [place] help right  away if:  You are short of breath.  You have chest pain.  You pass out (faint).  You cannot move your muscles. This information is not intended to replace advice given to you by your health care provider. Make sure you discuss any questions you have with your health care provider. Document Released: 02/21/2005 Document Revised: 07/30/2015 Document Reviewed: 05/29/2013 Elsevier Interactive Patient Education  2018 [employer].

## 2016-11-03 ENCOUNTER — Other Ambulatory Visit: Payer: Medicare Other

## 2016-11-03 ENCOUNTER — Ambulatory Visit (HOSPITAL_COMMUNITY)
Admission: RE | Admit: 2016-11-03 | Discharge: 2016-11-03 | Disposition: A | Discharge status: Home / Self Care | Payer: Medicare Other | Source: Ambulatory Visit | Attending: Family Medicine | Admitting: Family Medicine

## 2016-11-03 DIAGNOSIS — E785 Hyperlipidemia, unspecified: Secondary | ICD-10-CM | POA: Diagnosis not present

## 2016-11-03 DIAGNOSIS — N182 Chronic kidney disease, stage 2 (mild): Secondary | ICD-10-CM

## 2016-11-03 DIAGNOSIS — E875 Hyperkalemia: Secondary | ICD-10-CM

## 2016-11-03 DIAGNOSIS — M7989 Other specified soft tissue disorders: Secondary | ICD-10-CM | POA: Diagnosis not present

## 2016-11-03 DIAGNOSIS — M25561 Pain in right knee: Secondary | ICD-10-CM | POA: Diagnosis not present

## 2016-11-03 LAB — CMP14+EGFR
A/G RATIO: 1.1 — AB (ref 1.2–2.2)
ALBUMIN: 3.6 g/dL (ref 3.5–5.5)
ALK PHOS: 75 IU/L (ref 39–117)
ALT: 12 IU/L (ref 0–44)
AST: 20 IU/L (ref 0–40)
BUN / CREAT RATIO: 7 — AB (ref 9–20)
BUN: 13 mg/dL (ref 6–24)
Bilirubin Total: 0.4 mg/dL (ref 0.0–1.2)
CALCIUM: 8.9 mg/dL (ref 8.7–10.2)
CO2: 26 mmol/L (ref 20–29)
Chloride: 102 mmol/L (ref 96–106)
Creatinine, Ser: 1.78 mg/dL — ABNORMAL HIGH (ref 0.76–1.27)
GFR calc Af Amer: 48 mL/min/{1.73_m2} — ABNORMAL LOW (ref >59)
GFR, EST NON AFRICAN AMERICAN: 41 mL/min/{1.73_m2} — AB (ref >59)
GLOBULIN, TOTAL: 3.4 g/dL (ref 1.5–4.5)
Glucose: 96 mg/dL (ref 65–99)
POTASSIUM: 5 mmol/L (ref 3.5–5.2)
Sodium: 141 mmol/L (ref 134–144)
Total Protein: 7 g/dL (ref 6.0–8.5)

## 2016-11-03 LAB — LIPID PANEL
CHOLESTEROL TOTAL: 152 mg/dL (ref 100–199)
Chol/HDL Ratio: 3.2 ratio (ref 0.0–5.0)
HDL: 47 mg/dL (ref >39)
LDL Calculated: 89 mg/dL (ref 0–99)
TRIGLYCERIDES: 81 mg/dL (ref 0–149)
VLDL Cholesterol Cal: 16 mg/dL (ref 5–40)

## 2016-11-08 DIAGNOSIS — M79676 Pain in unspecified toe(s): Secondary | ICD-10-CM | POA: Diagnosis not present

## 2016-11-08 DIAGNOSIS — B351 Tinea unguium: Secondary | ICD-10-CM | POA: Diagnosis not present

## 2016-11-22 DIAGNOSIS — N359 Urethral stricture, unspecified: Secondary | ICD-10-CM | POA: Diagnosis not present

## 2016-12-05 DIAGNOSIS — H10013 Acute follicular conjunctivitis, bilateral: Secondary | ICD-10-CM | POA: Diagnosis not present

## 2016-12-22 DIAGNOSIS — N35011 Post-traumatic bulbous urethral stricture: Secondary | ICD-10-CM | POA: Diagnosis not present

## 2016-12-25 ENCOUNTER — Inpatient Hospital Stay (HOSPITAL_COMMUNITY)
Admission: EM | Admit: 2016-12-25 | Discharge: 2016-12-27 | DRG: 354 | Disposition: A | Discharge status: Home Health | Payer: Medicare Other | Attending: General Surgery | Admitting: General Surgery

## 2016-12-25 ENCOUNTER — Encounter (HOSPITAL_COMMUNITY): Payer: Self-pay | Admitting: Emergency Medicine

## 2016-12-25 ENCOUNTER — Emergency Department (HOSPITAL_COMMUNITY): Payer: Medicare Other

## 2016-12-25 DIAGNOSIS — N183 Chronic kidney disease, stage 3 unspecified: Secondary | ICD-10-CM | POA: Diagnosis present

## 2016-12-25 DIAGNOSIS — N1330 Unspecified hydronephrosis: Secondary | ICD-10-CM | POA: Diagnosis not present

## 2016-12-25 DIAGNOSIS — R079 Chest pain, unspecified: Secondary | ICD-10-CM | POA: Diagnosis present

## 2016-12-25 DIAGNOSIS — Z23 Encounter for immunization: Secondary | ICD-10-CM

## 2016-12-25 DIAGNOSIS — Z79899 Other long term (current) drug therapy: Secondary | ICD-10-CM

## 2016-12-25 DIAGNOSIS — K56609 Unspecified intestinal obstruction, unspecified as to partial versus complete obstruction: Secondary | ICD-10-CM | POA: Diagnosis not present

## 2016-12-25 DIAGNOSIS — Z9359 Other cystostomy status: Secondary | ICD-10-CM | POA: Diagnosis not present

## 2016-12-25 DIAGNOSIS — Z7982 Long term (current) use of aspirin: Secondary | ICD-10-CM

## 2016-12-25 DIAGNOSIS — F439 Reaction to severe stress, unspecified: Secondary | ICD-10-CM | POA: Diagnosis not present

## 2016-12-25 DIAGNOSIS — N400 Enlarged prostate without lower urinary tract symptoms: Secondary | ICD-10-CM | POA: Diagnosis present

## 2016-12-25 DIAGNOSIS — N131 Hydronephrosis with ureteral stricture, not elsewhere classified: Secondary | ICD-10-CM | POA: Diagnosis present

## 2016-12-25 DIAGNOSIS — Q909 Down syndrome, unspecified: Secondary | ICD-10-CM

## 2016-12-25 DIAGNOSIS — K219 Gastro-esophageal reflux disease without esophagitis: Secondary | ICD-10-CM | POA: Diagnosis present

## 2016-12-25 DIAGNOSIS — Z86718 Personal history of other venous thrombosis and embolism: Secondary | ICD-10-CM

## 2016-12-25 DIAGNOSIS — N319 Neuromuscular dysfunction of bladder, unspecified: Secondary | ICD-10-CM | POA: Diagnosis present

## 2016-12-25 DIAGNOSIS — K436 Other and unspecified ventral hernia with obstruction, without gangrene: Secondary | ICD-10-CM | POA: Diagnosis not present

## 2016-12-25 DIAGNOSIS — Z86711 Personal history of pulmonary embolism: Secondary | ICD-10-CM

## 2016-12-25 DIAGNOSIS — M1990 Unspecified osteoarthritis, unspecified site: Secondary | ICD-10-CM | POA: Diagnosis present

## 2016-12-25 DIAGNOSIS — N3020 Other chronic cystitis without hematuria: Secondary | ICD-10-CM | POA: Diagnosis present

## 2016-12-25 DIAGNOSIS — E785 Hyperlipidemia, unspecified: Secondary | ICD-10-CM | POA: Diagnosis present

## 2016-12-25 DIAGNOSIS — F79 Unspecified intellectual disabilities: Secondary | ICD-10-CM | POA: Diagnosis present

## 2016-12-25 DIAGNOSIS — Z960 Presence of urogenital implants: Secondary | ICD-10-CM | POA: Diagnosis present

## 2016-12-25 DIAGNOSIS — R739 Hyperglycemia, unspecified: Secondary | ICD-10-CM | POA: Diagnosis present

## 2016-12-25 DIAGNOSIS — Q430 Meckel's diverticulum (displaced) (hypertrophic): Secondary | ICD-10-CM

## 2016-12-25 LAB — BASIC METABOLIC PANEL
ANION GAP: 11 (ref 5–15)
BUN: 15 mg/dL (ref 6–20)
CO2: 27 mmol/L (ref 22–32)
Calcium: 9.3 mg/dL (ref 8.9–10.3)
Chloride: 102 mmol/L (ref 101–111)
Creatinine, Ser: 1.85 mg/dL — ABNORMAL HIGH (ref 0.61–1.24)
GFR, EST AFRICAN AMERICAN: 45 mL/min — AB (ref >60)
GFR, EST NON AFRICAN AMERICAN: 39 mL/min — AB (ref >60)
Glucose, Bld: 155 mg/dL — ABNORMAL HIGH (ref 65–99)
POTASSIUM: 4 mmol/L (ref 3.5–5.1)
Sodium: 140 mmol/L (ref 135–145)

## 2016-12-25 LAB — CBC
HEMATOCRIT: 45.3 % (ref 39.0–52.0)
HEMOGLOBIN: 16 g/dL (ref 13.0–17.0)
MCH: 34.9 pg — AB (ref 26.0–34.0)
MCHC: 35.3 g/dL (ref 30.0–36.0)
MCV: 98.7 fL (ref 78.0–100.0)
Platelets: 238 10*3/uL (ref 150–400)
RBC: 4.59 MIL/uL (ref 4.22–5.81)
RDW: 13.4 % (ref 11.5–15.5)
WBC: 8.9 10*3/uL (ref 4.0–10.5)

## 2016-12-25 LAB — TROPONIN I

## 2016-12-25 MED ORDER — ACETAMINOPHEN 325 MG PO TABS
650.0000 mg | ORAL_TABLET | Freq: Four times a day (QID) | ORAL | Status: DC | PRN
  Administered 2016-12-26: 650 mg via ORAL
  Filled 2016-12-25: qty 2

## 2016-12-25 MED ORDER — LACTATED RINGERS IV SOLN
INTRAVENOUS | Status: DC
  Administered 2016-12-25: 100 mL via INTRAVENOUS
  Administered 2016-12-26 (×2): via INTRAVENOUS

## 2016-12-25 MED ORDER — PANTOPRAZOLE SODIUM 40 MG PO TBEC
40.0000 mg | DELAYED_RELEASE_TABLET | Freq: Every morning | ORAL | Status: DC
  Administered 2016-12-27: 40 mg via ORAL
  Filled 2016-12-25: qty 1

## 2016-12-25 MED ORDER — ACETAMINOPHEN 650 MG RE SUPP: 650.0000 mg | Freq: Four times a day (QID) | RECTAL | Status: DC | PRN

## 2016-12-25 MED ORDER — PROCHLORPERAZINE EDISYLATE 5 MG/ML IJ SOLN
10.0000 mg | Freq: Once | INTRAMUSCULAR | Status: AC
  Administered 2016-12-25: 10 mg via INTRAVENOUS
  Filled 2016-12-25: qty 2

## 2016-12-25 MED ORDER — MORPHINE SULFATE (PF) 2 MG/ML IV SOLN
2.0000 mg | INTRAVENOUS | Status: DC | PRN
  Administered 2016-12-26: 2 mg via INTRAVENOUS
  Filled 2016-12-25: qty 1

## 2016-12-25 MED ORDER — ONDANSETRON HCL 4 MG/2ML IJ SOLN: 4.0000 mg | Freq: Four times a day (QID) | INTRAMUSCULAR | Status: DC | PRN

## 2016-12-25 MED ORDER — ONDANSETRON HCL 4 MG PO TABS: 4.0000 mg | ORAL_TABLET | Freq: Four times a day (QID) | ORAL | Status: DC | PRN

## 2016-12-25 MED ORDER — IOPAMIDOL (ISOVUE-300) INJECTION 61%
INTRAVENOUS | Status: AC
  Filled 2016-12-25: qty 30

## 2016-12-25 MED ORDER — SIMVASTATIN 20 MG PO TABS
40.0000 mg | ORAL_TABLET | Freq: Every day | ORAL | Status: DC
  Administered 2016-12-26: 40 mg via ORAL
  Filled 2016-12-25: qty 2

## 2016-12-25 MED ORDER — SODIUM CHLORIDE 0.9 % IV BOLUS (SEPSIS)
1000.0000 mL | Freq: Once | INTRAVENOUS | Status: AC
  Administered 2016-12-25: 1000 mL via INTRAVENOUS

## 2016-12-25 MED ORDER — ASPIRIN EC 81 MG PO TBEC
81.0000 mg | DELAYED_RELEASE_TABLET | Freq: Every day | ORAL | Status: DC
  Administered 2016-12-27: 81 mg via ORAL
  Filled 2016-12-25: qty 1

## 2016-12-25 MED ORDER — PROMETHAZINE HCL 25 MG/ML IJ SOLN: 12.5000 mg | Freq: Four times a day (QID) | INTRAMUSCULAR | Status: DC | PRN

## 2016-12-25 MED ORDER — TAMSULOSIN HCL 0.4 MG PO CAPS
0.4000 mg | ORAL_CAPSULE | Freq: Every morning | ORAL | Status: DC
  Administered 2016-12-27: 0.4 mg via ORAL
  Filled 2016-12-25: qty 1

## 2016-12-25 NOTE — ED Triage Notes (Signed)
Pt c/o cp/n/sweating this am.

## 2016-12-25 NOTE — ED Provider Notes (Signed)
[facility] EMERGENCY DEPARTMENT Provider Note   CSN: [id number] Arrival date & time: 12/25/16  1128     History   Chief Complaint Chief Complaint  Patient presents with  . Chest Pain    HPI Frank Levine is a 56 y.o. male.  HPI  Presents with his mother and aunt who provide the HPI. Patient has Down syndrome, but does answer questions briefly by nodding, answering succinctly, seemingly appropriately. They note that the patient has been ill for 2 days.  When he was well prior to the onset of illness, though he has history of multiple medical issues including chronic suprapubic catheter, and a ventral hernia. No known history of cardiac or pulmonary disease. Now over the past 2 days the patient has described discomfort in the chest, upper abdomen, with associated nausea, retching. No reported fever, no change in bowel movements since yesterday, but no bowel movements since yesterday.   Past Medical History:  Diagnosis Date  . Anxiety   . Arthritis   . Bilateral hydronephrosis   . BPH (benign prostatic hypertrophy)   . Chronic cystitis   . Chronic urethral stricture    urologist-  dr Risa Grill  . CKD (chronic kidney disease), stage II   . Down's syndrome 09/27/2011  . GERD (gastroesophageal reflux disease)    OCCASIONAL  . History of DVT of lower extremity    POST GU SURGERY  2013  &  2011  . History of Meckel's diverticulum    BLADDER  . History of pulmonary embolus (PE)    DEC 2011  POST GU SURGERY --  [place]  FOR 6 MONTHS  . Hyperlipemia   . Mental retardation   . Neurogenic bladder   . Wears glasses     Patient Active Problem List   Diagnosis Date Noted  . Ventral hernia without obstruction or gangrene 08/17/2016  . Orchitis of right testicle 05/24/2016  . CKD (chronic kidney disease) 04/30/2015  . GERD (gastroesophageal reflux disease) 04/01/2014  . Hyperlipidemia 04/01/2014  . Pulmonary emboli ([place]) 09/27/2011  . DVT, lower extremity, distal ([place]) 09/27/2011   . Urethral stricture 09/27/2011  . Leukopenia 09/27/2011  . Down's syndrome 09/27/2011    Past Surgical History:  Procedure Laterality Date  . BALLOON DILATION N/A 06/03/2015   Procedure: BALLOON DILATION;  Surgeon: Rana Snare, MD;  Location: [facility];  Service: Urology;  Laterality: N/A;  . CYSTO/  BALLOON DILATION URETHRAL STRICTURE/  REMOVAL & REPLACEMENT RIGHT URETERAL STETN/  RIGHT RETROGRADE PYELOGRAM/   LEFT URETEROSCOPY/  ATTEMPTED REMOVAL LEFT STENT  05-10-2010  . CYSTO/  BALLOON DILATION URETHRAL STRICTURE/  URETEROSCOPY/ REMOVAL LEFT STENT  10-17-2010  . CYSTO/  BALLOON DILATTION URETHRAL STRICTURE/ BILATERAL RETROGRADE PYELOGRAM/ BILATERAL URETERAL STENTS/ BLADDER BX/  LEFT URETEROSCOPY  01-27-2010  . CYSTOGRAM N/A 07/10/2013   Procedure: CYSTOGRAM;  Surgeon: Bernestine Amass, MD;  Location: [facility];  Service: Urology;  Laterality: N/A;  . CYSTOGRAM N/A 07/02/2014   Procedure: CYSTOGRAM;  Surgeon: Rana Snare, MD;  Location[facility];  Service: Urology;  Laterality: N/A;  . CYSTOSCOPY W/ RETROGRADES Right 06/03/2015   Procedure: CYSTOSCOPY WITH RIGHT RETROGRADE PYELOGRAM CYSTOGRAM;  Surgeon: Rana Snare, MD;  Location: [facility];  Service: Urology;  Laterality: Right;  . CYSTOSCOPY WITH URETHRAL DILATATION N/A 07/10/2013   Procedure: CYSTOSCOPY WITH URETHRAL DILATATION, BALLOON DILITATION, RIGHT  RETROGRADE;  Surgeon: Bernestine Amass, MD;  Location: [facility];  Service: Urology;  Laterality: N/A;  . CYSTOSCOPY WITH URETHRAL DILATATION N/A 07/02/2014   Procedure: CYSTOSCOPY WITH URETHRAL DILATATION;  Surgeon: Rana Snare, MD;  Location: [facility];  Service: Urology;  Laterality: N/A;  . INSERTION OF SUPRAPUBIC CATHETER N/A 06/03/2015   Procedure: INSERTION OF SUPRAPUBIC CATHETER;  Surgeon: Rana Snare, MD;  Location: [facility];  Service: Urology;  Laterality: N/A;         Home Medications    Prior to Admission medications   Medication Sig Start Date End Date Taking? Authorizing Provider  aspirin EC 81 MG tablet Take 81 mg by mouth daily.    Yes [provider]  docusate sodium (COLACE) 100 MG capsule Take 100 mg by mouth at bedtime.    Yes [provider]  pantoprazole (PROTONIX) 40 MG tablet Take 1 Tablet by mouth every morning 04/18/16  Yes Timmothy Euler, MD  simvastatin (ZOCOR) 40 MG tablet Take 1 tablet by mouth once daily at 6 PM 08/29/16  Yes Terald Sleeper, PA-C  tamsulosin [facility]) 0.4 MG CAPS capsule Take 1 Capsule by mouth every morning 08/29/16  Yes Terald Sleeper, PA-C  Vitamin D, Ergocalciferol, (DRISDOL) 50000 units CAPS capsule Take 50,000 Units by mouth every 7 (seven) days.   Yes [provider]    Family History Family History  Problem Relation Age of Onset  . Hypertension Mother   . Hyperlipidemia Mother   . Cancer Father     Social History Social History  Substance Use Topics  . Smoking status: Never Smoker  . Smokeless tobacco: Never Used  . Alcohol use No     Allergies   Patient has no known allergies.   Review of Systems Review of Systems  Constitutional:       Per HPI, otherwise negative  HENT:       Per HPI, otherwise negative  Respiratory:       Per HPI, otherwise negative  Cardiovascular:       Per HPI, otherwise negative  Gastrointestinal: Positive for abdominal pain and nausea. Negative for vomiting.  Endocrine:       Negative aside from HPI  Genitourinary:       Neg aside from HPI   Musculoskeletal:       Per HPI, otherwise negative  Skin: Negative.   Neurological: Negative for syncope.     Physical Exam Updated Vital Signs BP 123/83   Pulse (!) 106   Temp (!) 97.5 F (36.4 C)   Resp 18   Ht 5\' 3"  (1.6 m)   Wt 78.9 kg (174 lb)   SpO2 100%   BMI 30.82 kg/m   Physical Exam  Constitutional: He is oriented to person, place, and time. He appears  well-developed. No distress.  HENT:  Head: Normocephalic and atraumatic.  Eyes: Conjunctivae and EOM are normal.  Cardiovascular: Normal rate and regular rhythm.   Pulmonary/Chest: Effort normal. No stridor. No respiratory distress.  Abdominal: He exhibits no distension.    Musculoskeletal: He exhibits no edema.  Neurological: He is alert and oriented to person, place, and time.  Skin: Skin is warm and dry.  Psychiatric: He has a normal mood and affect.  Nursing note and vitals reviewed.    ED Treatments / Results  Labs (all labs ordered are listed, but only abnormal results are displayed) Labs Reviewed  BASIC METABOLIC PANEL - Abnormal; Notable for the following:       Result Value   Glucose, Bld 155 (*)  Creatinine, Ser 1.85 (*)    GFR calc non Af Amer 39 (*)    GFR calc Af Amer 45 (*)    All other components within normal limits  CBC - Abnormal; Notable for the following:    MCH 34.9 (*)    All other components within normal limits  TROPONIN I    EKG  EKG Interpretation  Date/Time:  Sunday December 25 2016 11:44:44 EDT Ventricular Rate:  90 PR Interval:    QRS Duration: 93 QT Interval:  399 QTC Calculation: 489 R Axis:   85 Text Interpretation:  Sinus rhythm Low voltage, precordial leads Borderline prolonged QT interval Artifact T wave abnormality Abnormal ekg Confirmed by Carmin Muskrat [phone]) on 12/25/2016 12:54:32 PM       Radiology Ct Abdomen Pelvis Wo Contrast  Result Date: 12/25/2016 CLINICAL DATA:  Nausea, vomiting, chest pain, sweating this morning EXAM: CT ABDOMEN AND PELVIS WITHOUT CONTRAST TECHNIQUE: Multidetector CT imaging of the abdomen and pelvis was performed following the standard protocol without IV contrast. COMPARISON:  05/20/2016 FINDINGS: Lower chest: No acute abnormality. Hepatobiliary: No focal liver abnormality is seen. No gallstones, gallbladder wall thickening, or biliary dilatation. Pancreas: Unremarkable. No pancreatic ductal  dilatation or surrounding inflammatory changes. Spleen: Normal in size without focal abnormality. Adrenals/Urinary Tract: Normal adrenal glands. Severe bilateral hydronephrosis. No renal mass. No urolithiasis. Bladder wall thickening likely chronic. Suprapubic catheter is present in satisfactory position. Right posterolateral bladder diverticulum. Stomach/Bowel: Small bowel dilatation measuring up to 3.5 cm. Dilated loops of small bowel are fluid-filled. Ventral abdominal hernia with the small bowel distal to the hernia decompressed. No pneumatosis, pneumoperitoneum or portal venous gas. Vascular/Lymphatic: No significant vascular findings are present. No enlarged abdominal or pelvic lymph nodes. Reproductive: Prostate is unremarkable. Other: Small left inguinal hernia. Musculoskeletal: No acute osseous abnormality. No lytic or sclerotic osseous lesion. Lower thoracic spine spondylosis. IMPRESSION: 1. Small bowel obstruction with a transition point at a ventral abdominal hernia. 2. Severe bilateral hydronephrosis. Electronically Signed   By: Kathreen Devoid   On: 12/25/2016 16:39   Dg Chest 2 View  Result Date: 12/25/2016 CLINICAL DATA:  Chest pain since last night EXAM: CHEST  2 VIEW COMPARISON:  08/25/2015 FINDINGS: There is mild bilateral interstitial prominence similar in appearance to 08/25/2015. There is no focal parenchymal opacity. There is no pleural effusion or pneumothorax. The heart mediastinum are stable. The osseous structures are unremarkable. IMPRESSION: No active cardiopulmonary disease. Electronically Signed   By: Kathreen Devoid   On: 12/25/2016 12:34    Procedures Procedures (including critical care time)  Medications Ordered in ED Medications  iopamidol (ISOVUE-300) 61 % injection (not administered)  sodium chloride 0.9 % bolus 1,000 mL (0 mLs Intravenous Stopped 12/25/16 1408)  prochlorperazine (COMPAZINE) injection 10 mg (10 mg Intravenous Given 12/25/16 1323)     Initial  Impression / Assessment and Plan / ED Course  I have reviewed the triage vital signs and the nursing notes.  Pertinent labs & imaging results that were available during my care of the patient were reviewed by me and considered in my medical decision making (see chart for details).  5:21 PM Patient awake and alert, no additional vomiting since arrival. I discussed the CT results with her surgeon on call. With concern for bowel obstruction, ventral hernia, patient will require admission for further evaluation and management. Family members also aware of CT results.     Final Clinical Impressions(s) / ED Diagnoses  Small bowel obstruction   Carmin Muskrat, MD  12/25/16 1721  

## 2016-12-25 NOTE — H&P (Signed)
History and Physical    Frank Levine HBZ:[id number] DOB: [birth year] DOA: 12/25/2016  PCP: Dettinger, Fransisca Kaufmann, MD Consultants:  Risa Grill - urology; Befekadu - nephrology; Aluisio - orthopedics Patient coming from:  Home - lives with his mother; [name]: mother, [phone]  Chief Complaint: chest pain  HPI: Frank Levine is a 56 y.o. male with medical history significant of Down syndrome with intellectual disability; HLD; GERD; neurogenic bladder, chronic ureteral stricture, BPH, and chronic bilateral hydronephrosis with resultant CKD and indwelling suprapubic catheter presenting with chest pain.  His mother, who provided all of the history, reports that he developed nausea, chest pain and back pain since yesterday but it becamse more severe today.  He was feeling bad Friday, he wasn't himself.  Friday he didn;'t complain, was just weak.  Dry heaves at home yesterday and today but he vomited after contrast dye in the ER.  ?subjective fever at home.  They were aware of the hernia but no h/o SBO in the past.  He saw Dr. Arnoldo Morale on 09/06/16 for ventral hernia with diastasis recti but there was no indication for hernia repair at that time due to low risk for incarceration.   ED Course: Patient awake and alert.  CT discussed with Dr. Arnoldo Morale, who will see patinet in AM.  Observation for SBO with ventral hernia.  Given Compazine and 1L NS bolus  Review of Systems: As per HPI; otherwise review of systems reviewed and negative.   Ambulatory Status:  Ambulates without assistance  Past Medical History:  Diagnosis Date  . Anxiety   . Arthritis   . Bilateral hydronephrosis   . BPH (benign prostatic hypertrophy)   . Chronic cystitis   . Chronic urethral stricture    urologist-  dr Risa Grill; has suprapubic catheter  . CKD (chronic kidney disease), stage II   . Down's syndrome 09/27/2011  . GERD (gastroesophageal reflux disease)    OCCASIONAL  . History of DVT of lower extremity    POST GU SURGERY  2013  &   2011  . History of Meckel's diverticulum    BLADDER  . History of pulmonary embolus (PE)    DEC 2011  [place] --  [place]  FOR 6 MONTHS  . Hyperlipemia   . Mental retardation   . Neurogenic bladder   . Wears glasses     Past Surgical History:  Procedure Laterality Date  . BALLOON DILATION N/A 06/03/2015   Procedure: BALLOON DILATION;  Surgeon: Rana Snare, MD;  Location: [facility];  Service: Urology;  Laterality: N/A;  . CYSTO/  BALLOON DILATION URETHRAL STRICTURE/  REMOVAL & REPLACEMENT RIGHT URETERAL STETN/  RIGHT RETROGRADE PYELOGRAM/   LEFT URETEROSCOPY/  ATTEMPTED REMOVAL LEFT STENT  05-10-2010  . CYSTO/  BALLOON DILATION URETHRAL STRICTURE/  URETEROSCOPY/ REMOVAL LEFT STENT  10-17-2010  . CYSTO/  BALLOON DILATTION URETHRAL STRICTURE/ BILATERAL RETROGRADE PYELOGRAM/ BILATERAL URETERAL STENTS/ BLADDER BX/  LEFT URETEROSCOPY  01-27-2010  . CYSTOGRAM N/A 07/10/2013   Procedure: CYSTOGRAM;  Surgeon: Bernestine Amass, MD;  Location: [facility];  Service: Urology;  Laterality: N/A;  . CYSTOGRAM N/A 07/02/2014   Procedure: CYSTOGRAM;  Surgeon: Rana Snare, MD;  Location: [facility];  Service: Urology;  Laterality: N/A;  . CYSTOSCOPY W/ RETROGRADES Right 06/03/2015   Procedure: CYSTOSCOPY WITH RIGHT RETROGRADE PYELOGRAM CYSTOGRAM;  Surgeon: Rana Snare, MD;  Location: [facility];  Service: Urology;  Laterality: Right;  . CYSTOSCOPY WITH URETHRAL DILATATION N/A 07/10/2013  Procedure: CYSTOSCOPY WITH URETHRAL DILATATION, BALLOON DILITATION, RIGHT  RETROGRADE;  Surgeon: Bernestine Amass, MD;  Location: [facility];  Service: Urology;  Laterality: N/A;  . CYSTOSCOPY WITH URETHRAL DILATATION N/A 07/02/2014   Procedure: CYSTOSCOPY WITH URETHRAL DILATATION;  Surgeon: Rana Snare, MD;  Location: [facility];  Service: Urology;  Laterality: N/A;  . INSERTION OF SUPRAPUBIC CATHETER N/A 06/03/2015   Procedure:  INSERTION OF SUPRAPUBIC CATHETER;  Surgeon: Rana Snare, MD;  Location: [facility];  Service: Urology;  Laterality: N/A;    Social History   Social History  . Marital status: Single    Spouse name: N/A  . Number of children: N/A  . Years of education: N/A   Occupational History  . part-time employee at [employer] History Main Topics  . Smoking status: Never Smoker  . Smokeless tobacco: Never Used  . Alcohol use No  . Drug use: No  . Sexual activity: Not on file   Other Topics Concern  . Not on file   Social History Narrative  . No narrative on file    No Known Allergies  Family History  Problem Relation Age of Onset  . Hypertension Mother   . Hyperlipidemia Mother   . Cancer Father     Prior to Admission medications   Medication Sig Start Date End Date Taking? Authorizing Provider  aspirin EC 81 MG tablet Take 81 mg by mouth daily.    Yes [provider]  docusate sodium (COLACE) 100 MG capsule Take 100 mg by mouth at bedtime.    Yes [provider]  pantoprazole (PROTONIX) 40 MG tablet Take 1 Tablet by mouth every morning 04/18/16  Yes Timmothy Euler, MD  simvastatin (ZOCOR) 40 MG tablet Take 1 tablet by mouth once daily at 6 PM 08/29/16  Yes Terald Sleeper, PA-C  tamsulosin [facility]) 0.4 MG CAPS capsule Take 1 Capsule by mouth every morning 08/29/16  Yes Terald Sleeper, PA-C  Vitamin D, Ergocalciferol, (DRISDOL) 50000 units CAPS capsule Take 50,000 Units by mouth every 7 (seven) days.   Yes [provider]    Physical Exam: Vitals:   12/25/16 1730 12/25/16 1749 12/25/16 1830 12/25/16 1852  BP: 122/82 122/82 121/78 121/78  Pulse:  62 100 63  Resp:  18  18  Temp:      SpO2:  98% 92% 93%  Weight:      Height:         General:  Appears calm and comfortable and is NAD; he was sleeping comfortably throughout my visit but was easily awakened with direct stimulation Eyes:  EOMI, normal lids, iris ENT:  grossly  normal hearing, lips & tongue, mmm Neck:  no LAD, masses or thyromegaly Cardiovascular:  RRR, no m/r/g. No LE edema.  Respiratory:   CTA bilaterally with no wheezes/rales/rhonchi.  Normal respiratory effort. Abdomen:  soft, NT, ND, hypoactive BS; ventral hernia noted without discoloration; suprapubic catheter in place Skin:  no rash or induration seen on limited exam Musculoskeletal:  grossly normal tone BUE/BLE, good ROM, no bony abnormality Lower extremity:  No LE edema.    2+ distal pulses. Psychiatric:  grossly normal mood and affect, speech fluent and appropriate, AOx3 Neurologic:  CN 2-12 grossly intact, moves all extremities in coordinated fashion, sensation intact    Radiological Exams on Admission: Ct Abdomen Pelvis Wo Contrast  Result Date: 12/25/2016 CLINICAL DATA:  Nausea, vomiting, chest pain, sweating this morning EXAM: CT  ABDOMEN AND PELVIS WITHOUT CONTRAST TECHNIQUE: Multidetector CT imaging of the abdomen and pelvis was performed following the standard protocol without IV contrast. COMPARISON:  05/20/2016 FINDINGS: Lower chest: No acute abnormality. Hepatobiliary: No focal liver abnormality is seen. No gallstones, gallbladder wall thickening, or biliary dilatation. Pancreas: Unremarkable. No pancreatic ductal dilatation or surrounding inflammatory changes. Spleen: Normal in size without focal abnormality. Adrenals/Urinary Tract: Normal adrenal glands. Severe bilateral hydronephrosis. No renal mass. No urolithiasis. Bladder wall thickening likely chronic. Suprapubic catheter is present in satisfactory position. Right posterolateral bladder diverticulum. Stomach/Bowel: Small bowel dilatation measuring up to 3.5 cm. Dilated loops of small bowel are fluid-filled. Ventral abdominal hernia with the small bowel distal to the hernia decompressed. No pneumatosis, pneumoperitoneum or portal venous gas. Vascular/Lymphatic: No significant vascular findings are present. No enlarged abdominal or  pelvic lymph nodes. Reproductive: Prostate is unremarkable. Other: Small left inguinal hernia. Musculoskeletal: No acute osseous abnormality. No lytic or sclerotic osseous lesion. Lower thoracic spine spondylosis. IMPRESSION: 1. Small bowel obstruction with a transition point at a ventral abdominal hernia. 2. Severe bilateral hydronephrosis. Electronically Signed   By: Kathreen Devoid   On: 12/25/2016 16:39   Dg Chest 2 View  Result Date: 12/25/2016 CLINICAL DATA:  Chest pain since last night EXAM: CHEST  2 VIEW COMPARISON:  08/25/2015 FINDINGS: There is mild bilateral interstitial prominence similar in appearance to 08/25/2015. There is no focal parenchymal opacity. There is no pleural effusion or pneumothorax. The heart mediastinum are stable. The osseous structures are unremarkable. IMPRESSION: No active cardiopulmonary disease. Electronically Signed   By: Kathreen Devoid   On: 12/25/2016 12:34    EKG: Independently reviewed.  NSR with rate 90; low voltage and artifact with nonspecific ST changes with no evidence of acute ischemia   Labs on Admission: I have personally reviewed the available labs and imaging studies at the time of the admission.  Pertinent labs:   Glucose 155 BUN 15/Creatinine 1.85/GFR 39; 13/1.78/41 on 8/30; baseline creatinine appears to be about 2 Troponin <0.03 WBC 8.9  Assessment/Plan Principal Problem:   Ventral hernia with bowel obstruction Active Problems:   Down's syndrome   CKD (chronic kidney disease) stage 3, GFR 30-59 ml/min (HCC)   Hyperglycemia   Ventral hernia with SBO -Patient with known h/o ventral hernia but no prior obstruction presenting with acute onset of abdominal (chest, back) pain with nausea and CT findings c/w SBO -Will place in observation status on Med Surg -Gen Surg consulted by ER and will see in AM; currently no indication for surgical intervention -NPO for bowel rest -NG tube not placed since the patient is not having emesis (other than  following CT contrast -IVF hydration -Pain control with morphine -Nausea control with Zofran -While previously thought to have a stable hernia that was unlikely to require surgery, this may make an elective outpatient surgical repair reasonable. -If symptoms worsen, he may require a more urgent/emergent surgical repair.  CKD with severe bilateral hydronephrosis -Patient with similar CT findings on 3/18 CT -Creatinine appears to be stable -He is followed by urology, Dr. Risa Grill; outpatient f/u appears to be reasonable at this time -Suprapubic catheter is in place  Down's syndrome -Patient with ID associated with Down syndrome -His mother assists with/makes his medical decisions for him and is very capable -He also has a cousin who is an Therapist, sports who is very involved and helpful  Hyperglycemia -May be stress response -Will follow with fasting AM labs  DVT prophylaxis:  SCDs pending possible need for  surgery Code Status:  Full - confirmed with family Family Communication: Mother and cousin Investment banker, corporate) present for entire visit Disposition Plan:  Home once clinically improved Consults called: Surgery - to see in AM Admission status: It is my clinical opinion that referral for OBSERVATION is reasonable and necessary in this patient based on the above information provided. The aforementioned taken together are felt to place the patient at high risk for further clinical deterioration. However it is anticipated that the patient may be medically stable for discharge from the hospital within 24 to 48 hours.    Karmen Bongo MD [facility]  If note is complete, please contact covering daytime or nighttime physician. [email or web address] Password [email or web address]  12/25/2016, 7:13 PM

## 2016-12-26 ENCOUNTER — Encounter (HOSPITAL_COMMUNITY): Payer: Self-pay | Admitting: *Deleted

## 2016-12-26 ENCOUNTER — Observation Stay (HOSPITAL_COMMUNITY): Payer: Medicare Other | Admitting: Anesthesiology

## 2016-12-26 ENCOUNTER — Encounter (HOSPITAL_COMMUNITY)
Admission: EM | Disposition: A | Discharge status: Home Health | Payer: Self-pay | Source: Home / Self Care | Attending: General Surgery

## 2016-12-26 DIAGNOSIS — K439 Ventral hernia without obstruction or gangrene: Secondary | ICD-10-CM | POA: Diagnosis not present

## 2016-12-26 DIAGNOSIS — F439 Reaction to severe stress, unspecified: Secondary | ICD-10-CM | POA: Diagnosis not present

## 2016-12-26 DIAGNOSIS — Z7982 Long term (current) use of aspirin: Secondary | ICD-10-CM | POA: Diagnosis not present

## 2016-12-26 DIAGNOSIS — N183 Chronic kidney disease, stage 3 (moderate): Secondary | ICD-10-CM | POA: Diagnosis present

## 2016-12-26 DIAGNOSIS — K56609 Unspecified intestinal obstruction, unspecified as to partial versus complete obstruction: Secondary | ICD-10-CM | POA: Diagnosis present

## 2016-12-26 DIAGNOSIS — E785 Hyperlipidemia, unspecified: Secondary | ICD-10-CM | POA: Diagnosis present

## 2016-12-26 DIAGNOSIS — M1990 Unspecified osteoarthritis, unspecified site: Secondary | ICD-10-CM | POA: Diagnosis present

## 2016-12-26 DIAGNOSIS — Z86711 Personal history of pulmonary embolism: Secondary | ICD-10-CM | POA: Diagnosis not present

## 2016-12-26 DIAGNOSIS — K219 Gastro-esophageal reflux disease without esophagitis: Secondary | ICD-10-CM | POA: Diagnosis present

## 2016-12-26 DIAGNOSIS — Z23 Encounter for immunization: Secondary | ICD-10-CM | POA: Diagnosis not present

## 2016-12-26 DIAGNOSIS — Z960 Presence of urogenital implants: Secondary | ICD-10-CM | POA: Diagnosis present

## 2016-12-26 DIAGNOSIS — N3020 Other chronic cystitis without hematuria: Secondary | ICD-10-CM | POA: Diagnosis present

## 2016-12-26 DIAGNOSIS — Z86718 Personal history of other venous thrombosis and embolism: Secondary | ICD-10-CM | POA: Diagnosis not present

## 2016-12-26 DIAGNOSIS — K436 Other and unspecified ventral hernia with obstruction, without gangrene: Secondary | ICD-10-CM | POA: Diagnosis not present

## 2016-12-26 DIAGNOSIS — N131 Hydronephrosis with ureteral stricture, not elsewhere classified: Secondary | ICD-10-CM | POA: Diagnosis present

## 2016-12-26 DIAGNOSIS — N1330 Unspecified hydronephrosis: Secondary | ICD-10-CM | POA: Diagnosis present

## 2016-12-26 DIAGNOSIS — R079 Chest pain, unspecified: Secondary | ICD-10-CM | POA: Diagnosis present

## 2016-12-26 DIAGNOSIS — F79 Unspecified intellectual disabilities: Secondary | ICD-10-CM | POA: Diagnosis present

## 2016-12-26 DIAGNOSIS — R739 Hyperglycemia, unspecified: Secondary | ICD-10-CM | POA: Diagnosis not present

## 2016-12-26 DIAGNOSIS — N319 Neuromuscular dysfunction of bladder, unspecified: Secondary | ICD-10-CM | POA: Diagnosis present

## 2016-12-26 DIAGNOSIS — Q909 Down syndrome, unspecified: Secondary | ICD-10-CM | POA: Diagnosis not present

## 2016-12-26 DIAGNOSIS — Z79899 Other long term (current) drug therapy: Secondary | ICD-10-CM | POA: Diagnosis not present

## 2016-12-26 DIAGNOSIS — Q430 Meckel's diverticulum (displaced) (hypertrophic): Secondary | ICD-10-CM | POA: Diagnosis not present

## 2016-12-26 DIAGNOSIS — Z9359 Other cystostomy status: Secondary | ICD-10-CM | POA: Diagnosis not present

## 2016-12-26 DIAGNOSIS — N400 Enlarged prostate without lower urinary tract symptoms: Secondary | ICD-10-CM | POA: Diagnosis present

## 2016-12-26 HISTORY — PX: VENTRAL HERNIA REPAIR: SHX424

## 2016-12-26 LAB — BASIC METABOLIC PANEL
Anion gap: 8 (ref 5–15)
BUN: 17 mg/dL (ref 6–20)
CALCIUM: 8.2 mg/dL — AB (ref 8.9–10.3)
CO2: 25 mmol/L (ref 22–32)
CREATININE: 1.57 mg/dL — AB (ref 0.61–1.24)
Chloride: 107 mmol/L (ref 101–111)
GFR calc Af Amer: 55 mL/min — ABNORMAL LOW (ref >60)
GFR calc non Af Amer: 47 mL/min — ABNORMAL LOW (ref >60)
GLUCOSE: 124 mg/dL — AB (ref 65–99)
Potassium: 3.5 mmol/L (ref 3.5–5.1)
Sodium: 140 mmol/L (ref 135–145)

## 2016-12-26 LAB — CBC
HCT: 40.1 % (ref 39.0–52.0)
HEMOGLOBIN: 13.7 g/dL (ref 13.0–17.0)
MCH: 34.3 pg — AB (ref 26.0–34.0)
MCHC: 34.2 g/dL (ref 30.0–36.0)
MCV: 100.5 fL — ABNORMAL HIGH (ref 78.0–100.0)
PLATELETS: 246 10*3/uL (ref 150–400)
RBC: 3.99 MIL/uL — ABNORMAL LOW (ref 4.22–5.81)
RDW: 13.8 % (ref 11.5–15.5)
WBC: 9.5 10*3/uL (ref 4.0–10.5)

## 2016-12-26 LAB — SURGICAL PCR SCREEN
MRSA, PCR: NEGATIVE
Staphylococcus aureus: POSITIVE — AB

## 2016-12-26 SURGERY — REPAIR, HERNIA, VENTRAL
Anesthesia: General | Site: Abdomen

## 2016-12-26 MED ORDER — CHLORHEXIDINE GLUCONATE CLOTH 2 % EX PADS
6.0000 | MEDICATED_PAD | Freq: Once | CUTANEOUS | Status: AC
  Administered 2016-12-26: 6 via TOPICAL

## 2016-12-26 MED ORDER — ENOXAPARIN SODIUM 40 MG/0.4ML ~~LOC~~ SOLN
40.0000 mg | SUBCUTANEOUS | Status: DC
  Administered 2016-12-27: 40 mg via SUBCUTANEOUS
  Filled 2016-12-26: qty 0.4

## 2016-12-26 MED ORDER — SUCCINYLCHOLINE CHLORIDE 20 MG/ML IJ SOLN
INTRAMUSCULAR | Status: DC | PRN
  Administered 2016-12-26: 130 mg via INTRAVENOUS

## 2016-12-26 MED ORDER — FENTANYL CITRATE (PF) 100 MCG/2ML IJ SOLN
INTRAMUSCULAR | Status: DC | PRN
  Administered 2016-12-26: 50 ug via INTRAVENOUS

## 2016-12-26 MED ORDER — PROPOFOL 10 MG/ML IV BOLUS
INTRAVENOUS | Status: DC | PRN
  Administered 2016-12-26: 130 mg via INTRAVENOUS

## 2016-12-26 MED ORDER — CHLORHEXIDINE GLUCONATE CLOTH 2 % EX PADS: 6.0000 | MEDICATED_PAD | Freq: Every day | CUTANEOUS | Status: DC

## 2016-12-26 MED ORDER — LIDOCAINE HCL 1 % IJ SOLN
INTRAMUSCULAR | Status: DC | PRN
  Administered 2016-12-26: 30 mg via INTRADERMAL

## 2016-12-26 MED ORDER — PHENYLEPHRINE HCL 10 MG/ML IJ SOLN
INTRAMUSCULAR | Status: DC | PRN
  Administered 2016-12-26 (×2): 40 ug via INTRAVENOUS

## 2016-12-26 MED ORDER — NEOSTIGMINE METHYLSULFATE 10 MG/10ML IV SOLN
INTRAVENOUS | Status: AC
  Filled 2016-12-26: qty 1

## 2016-12-26 MED ORDER — LIDOCAINE HCL (PF) 1 % IJ SOLN
INTRAMUSCULAR | Status: AC
  Filled 2016-12-26: qty 5

## 2016-12-26 MED ORDER — MIDAZOLAM HCL 5 MG/5ML IJ SOLN
INTRAMUSCULAR | Status: DC | PRN
  Administered 2016-12-26 (×2): 1 mg via INTRAVENOUS

## 2016-12-26 MED ORDER — 0.9 % SODIUM CHLORIDE (POUR BTL) OPTIME
TOPICAL | Status: DC | PRN
  Administered 2016-12-26: 1000 mL

## 2016-12-26 MED ORDER — CEFAZOLIN SODIUM-DEXTROSE 2-4 GM/100ML-% IV SOLN
INTRAVENOUS | Status: AC
  Filled 2016-12-26: qty 100

## 2016-12-26 MED ORDER — MUPIROCIN 2 % EX OINT
1.0000 "application " | TOPICAL_OINTMENT | Freq: Two times a day (BID) | CUTANEOUS | Status: DC
  Administered 2016-12-26 – 2016-12-27 (×2): 1 via NASAL
  Filled 2016-12-26: qty 22

## 2016-12-26 MED ORDER — ROCURONIUM BROMIDE 50 MG/5ML IV SOLN
INTRAVENOUS | Status: AC
  Filled 2016-12-26: qty 1

## 2016-12-26 MED ORDER — CEFAZOLIN SODIUM-DEXTROSE 2-4 GM/100ML-% IV SOLN
2.0000 g | INTRAVENOUS | Status: AC
  Administered 2016-12-26: 2 g via INTRAVENOUS

## 2016-12-26 MED ORDER — SIMETHICONE 80 MG PO CHEW: 40.0000 mg | CHEWABLE_TABLET | Freq: Four times a day (QID) | ORAL | Status: DC | PRN

## 2016-12-26 MED ORDER — INFLUENZA VAC SPLIT QUAD 0.5 ML IM SUSY
0.5000 mL | PREFILLED_SYRINGE | INTRAMUSCULAR | Status: AC
  Administered 2016-12-27: 0.5 mL via INTRAMUSCULAR
  Filled 2016-12-26: qty 0.5

## 2016-12-26 MED ORDER — POVIDONE-IODINE 10 % EX OINT
TOPICAL_OINTMENT | CUTANEOUS | Status: AC
  Filled 2016-12-26: qty 1

## 2016-12-26 MED ORDER — MIDAZOLAM HCL 2 MG/2ML IJ SOLN
INTRAMUSCULAR | Status: AC
  Filled 2016-12-26: qty 2

## 2016-12-26 MED ORDER — FENTANYL CITRATE (PF) 100 MCG/2ML IJ SOLN
INTRAMUSCULAR | Status: AC
  Filled 2016-12-26: qty 2

## 2016-12-26 MED ORDER — KETOROLAC TROMETHAMINE 30 MG/ML IJ SOLN
30.0000 mg | Freq: Once | INTRAMUSCULAR | Status: AC
  Administered 2016-12-26: 30 mg via INTRAVENOUS
  Filled 2016-12-26: qty 1

## 2016-12-26 MED ORDER — MUPIROCIN 2 % EX OINT: 1.0000 "application " | TOPICAL_OINTMENT | Freq: Two times a day (BID) | CUTANEOUS | Status: DC

## 2016-12-26 MED ORDER — SUCCINYLCHOLINE CHLORIDE 20 MG/ML IJ SOLN
INTRAMUSCULAR | Status: AC
  Filled 2016-12-26: qty 1

## 2016-12-26 MED ORDER — BUPIVACAINE LIPOSOME 1.3 % IJ SUSP
INTRAMUSCULAR | Status: DC | PRN
  Administered 2016-12-26: 20 mL

## 2016-12-26 MED ORDER — BUPIVACAINE LIPOSOME 1.3 % IJ SUSP
INTRAMUSCULAR | Status: AC
  Filled 2016-12-26: qty 20

## 2016-12-26 SURGICAL SUPPLY — 40 items
BAG HAMPER (MISCELLANEOUS) ×2 IMPLANT
CHLORAPREP W/TINT 26ML (MISCELLANEOUS) ×2 IMPLANT
CLOTH BEACON ORANGE TIMEOUT ST (SAFETY) ×2 IMPLANT
COVER LIGHT HANDLE STERIS (MISCELLANEOUS) ×4 IMPLANT
ELECT REM PT RETURN 9FT ADLT (ELECTROSURGICAL) ×2
ELECTRODE REM PT RTRN 9FT ADLT (ELECTROSURGICAL) ×1 IMPLANT
FORMALIN 10 PREFIL 480ML (MISCELLANEOUS) ×2 IMPLANT
GAUZE SPONGE 4X4 12PLY STRL (GAUZE/BANDAGES/DRESSINGS) ×2 IMPLANT
GLOVE BIOGEL PI IND STRL 6.5 (GLOVE) IMPLANT
GLOVE BIOGEL PI IND STRL 7.0 (GLOVE) ×1 IMPLANT
GLOVE BIOGEL PI INDICATOR 6.5 (GLOVE) ×1
GLOVE BIOGEL PI INDICATOR 7.0 (GLOVE) ×1
GLOVE SURG SS PI 7.5 STRL IVOR (GLOVE) ×2 IMPLANT
GOWN STRL REUS W/ TWL XL LVL3 (GOWN DISPOSABLE) IMPLANT
GOWN STRL REUS W/TWL LRG LVL3 (GOWN DISPOSABLE) ×4 IMPLANT
GOWN STRL REUS W/TWL XL LVL3 (GOWN DISPOSABLE)
INST SET MAJOR GENERAL (KITS) ×2 IMPLANT
KIT ROOM TURNOVER APOR (KITS) ×2 IMPLANT
MANIFOLD NEPTUNE II (INSTRUMENTS) ×2 IMPLANT
MESH VENTRALEX ST 2.5 CRC MED (Mesh General) ×2 IMPLANT
NDL HYPO 25X1 1.5 SAFETY (NEEDLE) ×1 IMPLANT
NEEDLE HYPO 25X1 1.5 SAFETY (NEEDLE) ×2 IMPLANT
NS IRRIG 1000ML POUR BTL (IV SOLUTION) ×2 IMPLANT
PACK ABDOMINAL MAJOR (CUSTOM PROCEDURE TRAY) ×2 IMPLANT
PAD ARMBOARD 7.5X6 YLW CONV (MISCELLANEOUS) ×2 IMPLANT
SET BASIN LINEN APH (SET/KITS/TRAYS/PACK) ×2 IMPLANT
STAPLER VISISTAT (STAPLE) ×2 IMPLANT
SUT ETHIBOND NAB MO 7 #0 18IN (SUTURE) IMPLANT
SUT NOVA NAB GS-21 1 T12 (SUTURE) IMPLANT
SUT NOVA NAB GS-22 2 2-0 T-19 (SUTURE) ×2 IMPLANT
SUT NOVA NAB GS-26 0 60 (SUTURE) IMPLANT
SUT SILK 2 0 (SUTURE)
SUT SILK 2-0 18XBRD TIE 12 (SUTURE) IMPLANT
SUT VIC AB 2-0 CT2 27 (SUTURE) IMPLANT
SUT VIC AB 3-0 SH 27 (SUTURE) ×2
SUT VIC AB 3-0 SH 27X BRD (SUTURE) IMPLANT
SUT VIC AB 4-0 PS2 27 (SUTURE) ×1 IMPLANT
SUT VICRYL AB 2 0 TIES (SUTURE) ×2 IMPLANT
SYR 20CC LL (SYRINGE) ×2 IMPLANT
TAPE PAPER 3X10 WHT MICROPORE (GAUZE/BANDAGES/DRESSINGS) ×1 IMPLANT

## 2016-12-26 NOTE — Op Note (Signed)
Patient:  Frank Levine  DOB:  1961/01/11  MRN:  [id number]   Preop Diagnosis:  Ventral hernia  Postop Diagnosis:  same  Procedure:  Ventral herniorrhaphy with mesh  Surgeon:  Aviva Signs, M.D.  Anes:  General endotracheal  Indications:  Patient is a 56 year old white male with a history of Down's syndrome whopresents with a bowel obstruction secondary to a reducible ventral hernia. The risks and benefits of the procedure including bleeding, infection, mesh use, and the possibility of recurrence of the hernia were fully exposed by into the patient and his family, who gave informed consent.  Procedure note:  The patient was placed the supine position. After induction of general endotracheal anesthesia, the abdomen was prepped and draped using the usual sterile technique with DuraPrep. Surgical site confirmation was performed.  A midline incision was made superior to the umbilicus. This is taken down to the fascia. The patient had a 3 cm midline abdominal wall hernia. A thickened omental sac was found. This was excised without difficulty. A small loop of bowel was attached to the hernia sac and this was freed away sharply without difficulty. A 6.4cm Bard Ventralax ST patchwas then inserted and secured to the fascia using 0 Ethibond interrupted sutures. The hernia defect was then closed transversely using 0 Ethibond interrupted sutures. Subcutaneous layer was reapproximated using a 3-0 Vicryl interrupted suture. Exparel was instilled into the surrounding wound. The skin was closed using staples. Betadine ointment and dry sterile dressings were applied.  All tape and needle counts were correct at the end the procedure. The patient was extubated in the operating room and transferred to PACU in stable condition.  Complications:  none  EBL:  minimal  Specimen:  none

## 2016-12-26 NOTE — Care Management Note (Signed)
Case Management Note  Patient Details  Name: JAIMIE REDDITT MRN: [id number] Date of Birth: [birth year]  Subjective/Objective:    Adm from home with ventral hernia with bowel obstruction. Hx of Downs, IDD, lives with mother. No HH or DME pta.                 Action/Plan: Surgery planned for today. [place] with family.    Expected Discharge Date:      12/28/2016            Expected Discharge Plan:  Home/Self Care  In-House Referral:     Discharge planning Services  CM Consult  Post Acute Care Choice:    Choice offered to:     DME Arranged:    DME Agency:     HH Arranged:    HH Agency:     Status of Service:  In process, will continue to follow  If discussed at Long Length of Stay Meetings, dates discussed:    Additional Comments:  Nichelle Renwick, Chauncey Reading, RN 12/26/2016, 11:08 AM

## 2016-12-26 NOTE — Transfer of Care (Signed)
Immediate Anesthesia Transfer of Care Note  Patient: Frank Levine  Procedure(s) Performed: HERNIA REPAIR VENTRAL ADULT (N/A Abdomen)  Patient Location: PACU  Anesthesia Type:General  Level of Consciousness: awake and patient cooperative  Airway & Oxygen Therapy: Patient Spontanous Breathing and Patient connected to face mask oxygen  Post-op Assessment: Report given to RN, Post -op Vital signs reviewed and stable and Patient moving all extremities  Post vital signs: Reviewed and stable  Last Vitals:  Vitals:   12/26/16 0625 12/26/16 1147  BP: 112/73   Pulse: (!) 105 (!) 107  Resp: 17 18  Temp: 37.3 C 36.8 C  SpO2: 93% 94%    Last Pain:  Vitals:   12/26/16 1147  TempSrc: Oral  PainSc:       Patients Stated Pain Goal: 4 (12/92/90 9030)  Complications: No apparent anesthesia complications

## 2016-12-26 NOTE — Anesthesia Procedure Notes (Signed)
Procedure Name: Intubation Date/Time: 12/26/2016 12:55 PM Performed by: Charmaine Downs Pre-anesthesia Checklist: Suction available, Emergency Drugs available, Patient identified and Patient being monitored Patient Re-evaluated:Patient Re-evaluated prior to induction Oxygen Delivery Method: Circle system utilized Preoxygenation: Pre-oxygenation with 100% oxygen Induction Type: IV induction, Rapid sequence and Cricoid Pressure applied Ventilation: Mask ventilation with difficulty Laryngoscope Size: Mac and 4 Grade View: Grade II Tube size: 7.0 mm Number of attempts: 1 Airway Equipment and Method: Stylet Placement Confirmation: ETT inserted through vocal cords under direct vision,  positive ETCO2 and breath sounds checked- equal and bilateral Secured at: 22 cm Tube secured with: Tape Dental Injury: Teeth and Oropharynx as per pre-operative assessment

## 2016-12-26 NOTE — Progress Notes (Signed)
Patient is going off the unit for surgery. Saline locked his IV and emptied this suprapubic catheter

## 2016-12-26 NOTE — Consult Note (Signed)
Reason for Consult:small bowel obstruction secondary to ventral hernia Referring Physician: Dr. Donnella Bi Frank Levine is an 56 y.o. male.  HPI: patient is a 56 year old white male with a history of a ventral hernia who I saw my office in July of this year who presented to the emergency room with a 2 day history of nausea and vomiting. He also had some mild abdominal pain. He has not had a bowel movement in 3 days. CT scan of the abdomen revealed a supraumbilical ventral hernia with small bowel contents. The distal bowel was collapsed. The hernia was reduced and the patient was admitted to the hospital for further evaluation and treatment. Since his admission, he has not had a further episode of nausea or vomiting, though he has not had a bowel movement yet. He currently has no pain.  Past Medical History:  Diagnosis Date  . Anxiety   . Arthritis   . Bilateral hydronephrosis   . BPH (benign prostatic hypertrophy)   . Chronic cystitis   . Chronic urethral stricture    urologist-  dr Frank Levine; has suprapubic catheter  . CKD (chronic kidney disease), stage II   . Down's syndrome 09/27/2011  . GERD (gastroesophageal reflux disease)    OCCASIONAL  . History of DVT of lower extremity    POST GU SURGERY  2013  &  2011  . History of Meckel's diverticulum    BLADDER  . History of pulmonary embolus (PE)    DEC 2011  POST GU SURG[place] --  [place]  FOR 6 MONTHS  . Hyperlipemia   . Mental retardation   . Neurogenic bladder   . Wears glasses     Past Surgical History:  Procedure Laterality Date  . BALLOON DILATION N/A 06/03/2015   Procedure: BALLOON DILATION;  Surgeon: Rana Snare, MD;  Location: [facility];  Service: Urology;  Laterality: N/A;  . CYSTO/  BALLOON DILATION URETHRAL STRICTURE/  REMOVAL & REPLACEMENT RIGHT URETERAL STETN/  RIGHT RETROGRADE PYELOGRAM/   LEFT URETEROSCOPY/  ATTEMPTED REMOVAL LEFT STENT  05-10-2010  . CYSTO/  BALLOON DILATION URETHRAL STRICTURE/  URETEROSCOPY/  REMOVAL LEFT STENT  10-17-2010  . CYSTO/  BALLOON DILATTION URETHRAL STRICTURE/ BILATERAL RETROGRADE PYELOGRAM/ BILATERAL URETERAL STENTS/ BLADDER BX/  LEFT URETEROSCOPY  01-27-2010  . CYSTOGRAM N/A 07/10/2013   Procedure: CYSTOGRAM;  Surgeon: Bernestine Amass, MD;  Location: [facility];  Service: Urology;  Laterality: N/A;  . CYSTOGRAM N/A 07/02/2014   Procedure: CYSTOGRAM;  Surgeon: Rana Snare, MD;  Location: [facility];  Service: Urology;  Laterality: N/A;  . CYSTOSCOPY W/ RETROGRADES Right 06/03/2015   Procedure: CYSTOSCOPY WITH RIGHT RETROGRADE PYELOGRAM CYSTOGRAM;  Surgeon: Rana Snare, MD;  Location: [facility];  Service: Urology;  Laterality: Right;  . CYSTOSCOPY WITH URETHRAL DILATATION N/A 07/10/2013   Procedure: CYSTOSCOPY WITH URETHRAL DILATATION, BALLOON DILITATION, RIGHT  RETROGRADE;  Surgeon: Bernestine Amass, MD;  Location: [facility];  Service: Urology;  Laterality: N/A;  . CYSTOSCOPY WITH URETHRAL DILATATION N/A 07/02/2014   Procedure: CYSTOSCOPY WITH URETHRAL DILATATION;  Surgeon: Rana Snare, MD;  Location: [facility];  Service: Urology;  Laterality: N/A;  . INSERTION OF SUPRAPUBIC CATHETER N/A 06/03/2015   Procedure: INSERTION OF SUPRAPUBIC CATHETER;  Surgeon: Rana Snare, MD;  Location: [facility];  Service: Urology;  Laterality: N/A;    Family History  Problem Relation Age of Onset  . Hypertension Mother   . Hyperlipidemia Mother   .  Cancer Father     Social History:  reports that he has never smoked. He has never used smokeless tobacco. He reports that he does not drink alcohol or use drugs.  Allergies: No Known Allergies  Medications:  Scheduled: . aspirin EC  81 mg Oral Daily  . Chlorhexidine Gluconate Cloth  6 each Topical Once   And  . Chlorhexidine Gluconate Cloth  6 each Topical Once  . [START ON 12/27/2016] Influenza vac split quadrivalent PF  0.5 mL Intramuscular  Tomorrow-1000  . pantoprazole  40 mg Oral q morning - 10a  . simvastatin  40 mg Oral q1800  . tamsulosin  0.4 mg Oral q morning - 10a    Results for orders placed or performed during the hospital encounter of 12/25/16 (from the past 48 hour(s))  Basic metabolic panel     Status: Abnormal   Collection Time: 12/25/16 11:45 AM  Result Value Ref Range   Sodium 140 135 - 145 mmol/L   Potassium 4.0 3.5 - 5.1 mmol/L   Chloride 102 101 - 111 mmol/L   CO2 27 22 - 32 mmol/L   Glucose, Bld 155 (H) 65 - 99 mg/dL   BUN 15 6 - 20 mg/dL   Creatinine, Ser 1.85 (H) 0.61 - 1.24 mg/dL   Calcium 9.3 8.9 - 10.3 mg/dL   GFR calc non Af Amer 39 (L) >60 mL/min   GFR calc Af Amer 45 (L) >60 mL/min    Comment: (NOTE) The eGFR has been calculated using the CKD EPI equation. This calculation has not been validated in all clinical situations. eGFR's persistently <60 mL/min signify possible Chronic Kidney Disease.    Anion gap 11 5 - 15  CBC     Status: Abnormal   Collection Time: 12/25/16 11:45 AM  Result Value Ref Range   WBC 8.9 4.0 - 10.5 K/uL   RBC 4.59 4.22 - 5.81 MIL/uL   Hemoglobin 16.0 13.0 - 17.0 g/dL   HCT 45.3 39.0 - 52.0 %   MCV 98.7 78.0 - 100.0 fL   MCH 34.9 (H) 26.0 - 34.0 pg   MCHC 35.3 30.0 - 36.0 g/dL   RDW 13.4 11.5 - 15.5 %   Platelets 238 150 - 400 K/uL  Troponin I     Status: None   Collection Time: 12/25/16 11:45 AM  Result Value Ref Range   Troponin I <0.03 <0.03 ng/mL  Basic metabolic panel     Status: Abnormal   Collection Time: 12/26/16  7:11 AM  Result Value Ref Range   Sodium 140 135 - 145 mmol/L   Potassium 3.5 3.5 - 5.1 mmol/L   Chloride 107 101 - 111 mmol/L   CO2 25 22 - 32 mmol/L   Glucose, Bld 124 (H) 65 - 99 mg/dL   BUN 17 6 - 20 mg/dL   Creatinine, Ser 1.57 (H) 0.61 - 1.24 mg/dL   Calcium 8.2 (L) 8.9 - 10.3 mg/dL   GFR calc non Af Amer 47 (L) >60 mL/min   GFR calc Af Amer 55 (L) >60 mL/min    Comment: (NOTE) The eGFR has been calculated using the CKD  EPI equation. This calculation has not been validated in all clinical situations. eGFR's persistently <60 mL/min signify possible Chronic Kidney Disease.    Anion gap 8 5 - 15  CBC     Status: Abnormal   Collection Time: 12/26/16  7:11 AM  Result Value Ref Range   WBC 9.5 4.0 - 10.5 K/uL  RBC 3.99 (L) 4.22 - 5.81 MIL/uL   Hemoglobin 13.7 13.0 - 17.0 g/dL   HCT 40.1 39.0 - 52.0 %   MCV 100.5 (H) 78.0 - 100.0 fL   MCH 34.3 (H) 26.0 - 34.0 pg   MCHC 34.2 30.0 - 36.0 g/dL   RDW 13.8 11.5 - 15.5 %   Platelets 246 150 - 400 K/uL    Ct Abdomen Pelvis Wo Contrast  Result Date: 12/25/2016 CLINICAL DATA:  Nausea, vomiting, chest pain, sweating this morning EXAM: CT ABDOMEN AND PELVIS WITHOUT CONTRAST TECHNIQUE: Multidetector CT imaging of the abdomen and pelvis was performed following the standard protocol without IV contrast. COMPARISON:  05/20/2016 FINDINGS: Lower chest: No acute abnormality. Hepatobiliary: No focal liver abnormality is seen. No gallstones, gallbladder wall thickening, or biliary dilatation. Pancreas: Unremarkable. No pancreatic ductal dilatation or surrounding inflammatory changes. Spleen: Normal in size without focal abnormality. Adrenals/Urinary Tract: Normal adrenal glands. Severe bilateral hydronephrosis. No renal mass. No urolithiasis. Bladder wall thickening likely chronic. Suprapubic catheter is present in satisfactory position. Right posterolateral bladder diverticulum. Stomach/Bowel: Small bowel dilatation measuring up to 3.5 cm. Dilated loops of small bowel are fluid-filled. Ventral abdominal hernia with the small bowel distal to the hernia decompressed. No pneumatosis, pneumoperitoneum or portal venous gas. Vascular/Lymphatic: No significant vascular findings are present. No enlarged abdominal or pelvic lymph nodes. Reproductive: Prostate is unremarkable. Other: Small left inguinal hernia. Musculoskeletal: No acute osseous abnormality. No lytic or sclerotic osseous  lesion. Lower thoracic spine spondylosis. IMPRESSION: 1. Small bowel obstruction with a transition point at a ventral abdominal hernia. 2. Severe bilateral hydronephrosis. Electronically Signed   By: Kathreen Devoid   On: 12/25/2016 16:39   Dg Chest 2 View  Result Date: 12/25/2016 CLINICAL DATA:  Chest pain since last night EXAM: CHEST  2 VIEW COMPARISON:  08/25/2015 FINDINGS: There is mild bilateral interstitial prominence similar in appearance to 08/25/2015. There is no focal parenchymal opacity. There is no pleural effusion or pneumothorax. The heart mediastinum are stable. The osseous structures are unremarkable. IMPRESSION: No active cardiopulmonary disease. Electronically Signed   By: Kathreen Devoid   On: 12/25/2016 12:34    ROS:  Pertinent items are noted in HPI.  Blood pressure 112/73, pulse (!) 105, temperature 99.1 F (37.3 C), resp. rate 17, height _0  (1.6 m), weight 170 lb 13.7 oz (77.5 kg), SpO2 93 %. Physical Exam: pleasant well-developed well-nourished white male no acute distress Head is normocephalic, atraumatic Lungs clear auscultation with equal breath sounds bilaterally Heart examination reveals a regular rate and rhythm without S3, S4, murmurs Abdomen is soft. An easily reducible supraumbilical midline ventral hernias present. A suprapubic catheter is in place. No rigidity is noted.  CT scan images personally reviewed  Assessment/Plan: Impression: Small bowel obstruction secondary to ventral hernia, now reduced Plan: Patient will be taken to the operating room today for ventral herniorrhaphy with mesh. The risks and benefits of the procedure including bleeding, infection, mesh use were fully explained to the patient, who gave informed consent. Patient's caregiver is with patient.  Aviva Signs 12/26/2016, 8:48 AM

## 2016-12-26 NOTE — Anesthesia Postprocedure Evaluation (Signed)
Anesthesia Post Note  Patient: Frank Levine  Procedure(s) Performed: HERNIA REPAIR VENTRAL ADULT (N/A Abdomen)  Patient location during evaluation: PACU Anesthesia Type: General Level of consciousness: awake and patient cooperative Pain management: pain level controlled Vital Signs Assessment: post-procedure vital signs reviewed and stable Respiratory status: spontaneous breathing, nonlabored ventilation and respiratory function stable Cardiovascular status: blood pressure returned to baseline Postop Assessment: no apparent nausea or vomiting Anesthetic complications: no     Last Vitals:  Vitals:   12/26/16 1147 12/26/16 1343  BP:    Pulse: (!) 107   Resp: 18   Temp: 36.8 C (P) 37.3 C  SpO2: 94%     Last Pain:  Vitals:   12/26/16 1343  TempSrc:   PainSc: (P) 0-No pain                 Yaritzi Craun J

## 2016-12-26 NOTE — Anesthesia Postprocedure Evaluation (Signed)
Anesthesia Post Note  Patient: Frank Levine  Procedure(s) Performed: HERNIA REPAIR VENTRAL ADULT (N/A Abdomen)  Patient location during evaluation: PACU Anesthesia Type: General Level of consciousness: awake and alert Pain management: satisfactory to patient Vital Signs Assessment: post-procedure vital signs reviewed and stable Respiratory status: spontaneous breathing Cardiovascular status: stable Postop Assessment: no apparent nausea or vomiting Anesthetic complications: no     Last Vitals:  Vitals:   12/26/16 1343 12/26/16 1345  BP: 112/79 108/74  Pulse: 96 98  Resp: 13 20  Temp: 37.3 C   SpO2: 100% 99%    Last Pain:  Vitals:   12/26/16 1343  TempSrc:   PainSc: 0-No pain                 Ebba Goll

## 2016-12-26 NOTE — Anesthesia Preprocedure Evaluation (Signed)
Anesthesia Evaluation  Patient identified by MRN, date of birth, ID band Patient awake    Airway        Dental   Pulmonary           Cardiovascular + Peripheral Vascular Disease       Neuro/Psych Anxiety    GI/Hepatic GERD  Medicated,  Endo/Other    Renal/GU Renal disease     Musculoskeletal  (+) Arthritis ,   Abdominal   Peds  Hematology   Anesthesia Other Findings   Reproductive/Obstetrics                             Anesthesia Physical Anesthesia Plan Anesthesia Quick Evaluation

## 2016-12-26 NOTE — Addendum Note (Signed)
Addendum  created 12/26/16 1415 by Vista Deck, CRNA   Sign clinical note

## 2016-12-26 NOTE — Care Management Obs Status (Signed)
[place] NOTIFICATION   Patient Details  Name: Frank Levine MRN: [id number] Date of Birth: 1960/05/06   Medicare Observation Status Notification Given:  Yes    Kmya Placide, Chauncey Reading, RN 12/26/2016, 10:09 AM

## 2016-12-26 NOTE — Anesthesia Preprocedure Evaluation (Signed)
Anesthesia Evaluation  Patient identified by MRN, date of birth, ID band Patient awake  General Assessment Comment:Down's syndrome  Airway Mallampati: I  TM Distance: >3 FB Neck ROM: Full    Dental  (+) Teeth Intact   Pulmonary    Pulmonary exam normal        Cardiovascular + Peripheral Vascular Disease   Rhythm:Regular Rate:Normal  Sinus rhythm Low voltage, precordial leads Borderline prolonged QT interval Artifact T wave abnormality Abnormal ekg   Neuro/Psych    GI/Hepatic GERD  ,  Endo/Other    Renal/GU Renal disease     Musculoskeletal  (+) Arthritis ,   Abdominal Normal abdominal exam  (+)   Peds  Hematology   Anesthesia Other Findings   Reproductive/Obstetrics                             Anesthesia Physical Anesthesia Plan  ASA: III and emergent  Anesthesia Plan: General   Post-op Pain Management:    Induction: Rapid sequence and Cricoid pressure planned  PONV Risk Score and Plan:   Airway Management Planned: Oral ETT  Additional Equipment:   Intra-op Plan:   Post-operative Plan: Extubation in OR  Informed Consent: I have reviewed the patients History and Physical, chart, labs and discussed the procedure including the risks, benefits and alternatives for the proposed anesthesia with the patient or authorized representative who has indicated his/her understanding and acceptance.   Dental advisory given  Plan Discussed with: Surgeon and CRNA  Anesthesia Plan Comments:         Anesthesia Quick Evaluation

## 2016-12-27 ENCOUNTER — Encounter (HOSPITAL_COMMUNITY): Payer: Self-pay | Admitting: General Surgery

## 2016-12-27 LAB — BASIC METABOLIC PANEL
ANION GAP: 6 (ref 5–15)
BUN: 20 mg/dL (ref 6–20)
CALCIUM: 8 mg/dL — AB (ref 8.9–10.3)
CO2: 27 mmol/L (ref 22–32)
Chloride: 107 mmol/L (ref 101–111)
Creatinine, Ser: 1.76 mg/dL — ABNORMAL HIGH (ref 0.61–1.24)
GFR calc Af Amer: 48 mL/min — ABNORMAL LOW (ref >60)
GFR calc non Af Amer: 41 mL/min — ABNORMAL LOW (ref >60)
GLUCOSE: 115 mg/dL — AB (ref 65–99)
Potassium: 3.8 mmol/L (ref 3.5–5.1)
Sodium: 140 mmol/L (ref 135–145)

## 2016-12-27 LAB — CBC
HCT: 39.5 % (ref 39.0–52.0)
HEMOGLOBIN: 13.1 g/dL (ref 13.0–17.0)
MCH: 33.9 pg (ref 26.0–34.0)
MCHC: 33.2 g/dL (ref 30.0–36.0)
MCV: 102.3 fL — ABNORMAL HIGH (ref 78.0–100.0)
Platelets: 202 10*3/uL (ref 150–400)
RBC: 3.86 MIL/uL — ABNORMAL LOW (ref 4.22–5.81)
RDW: 13.9 % (ref 11.5–15.5)
WBC: 9.5 10*3/uL (ref 4.0–10.5)

## 2016-12-27 MED ORDER — HYDROCODONE-ACETAMINOPHEN 5-325 MG PO TABS: 1.0000 | ORAL_TABLET | ORAL | 0 refills | Status: DC | PRN

## 2016-12-27 NOTE — Discharge Summary (Signed)
Physician Discharge Summary  Patient ID: Frank Levine MRN: [id number] DOB/AGE: June 28, 1960 56 y.o.  Admit date: 12/25/2016 Discharge date: 12/27/2016  Admission Diagnoses:small bowel obstruction, ventral hernia  Discharge Diagnoses: same Principal Problem:   Ventral hernia with bowel obstruction Active Problems:   Down's syndrome   CKD (chronic kidney disease) stage 3, GFR 30-59 ml/min (HCC)   SBO (small bowel obstruction) (HCC)   Hyperglycemia   Bilateral hydronephrosis   Discharged Condition: good  Hospital Course: patient is a 56 year old white male with Down's syndrome who presented to the emergency room with nausea and vomiting. He had known ventral hernia and had developed a small bowel obstruction. The hernia was easily reduced. He stopped going underwent a ventral herniorrhaphy with mesh on 12/26/2016. Tolerated procedure well. His postoperative course has been unremarkable. His diet was advanced without difficulty. The patient is being discharged home on postoperative day 1 in good and improving condition.  Treatments: surgery: ventral herniorrhaphy with mesh on 12/26/2016  Discharge Exam: Blood pressure 104/74, pulse (!) 103, temperature 98.8 F (37.1 C), temperature source Oral, resp. rate 18, height 5\' 3"  (1.6 m), weight 178 lb 9.6 oz (81 kg), SpO2 99 %. General appearance: alert, cooperative and no distress Resp: clear to auscultation bilaterally Cardio: regular rate and rhythm, S1, S2 normal, no murmur, click, rub or gallop GI: soft, bowel sounds present. Dressing dry and intact.  Disposition: 06-Home-Health Care Svc  Discharge Instructions    Diet - low sodium heart healthy    Complete by:  As directed    Increase activity slowly    Complete by:  As directed      Allergies as of 12/27/2016   No Known Allergies     Medication List    TAKE these medications   aspirin EC 81 MG tablet Take 81 mg by mouth daily.   docusate sodium 100 MG capsule Commonly  known as:  COLACE Take 100 mg by mouth at bedtime.   HYDROcodone-acetaminophen 5-325 MG tablet Commonly known as:  NORCO Take 1 tablet by mouth every 4 (four) hours as needed for moderate pain.   pantoprazole 40 MG tablet Commonly known as:  PROTONIX Take 1 Tablet by mouth every morning   simvastatin 40 MG tablet Commonly known as:  ZOCOR Take 1 tablet by mouth once daily at 6 PM   tamsulosin 0.4 MG Caps capsule Commonly known as:  FLOMAX Take 1 Capsule by mouth every morning   Vitamin D (Ergocalciferol) 50000 units Caps capsule Commonly known as:  DRISDOL Take 50,000 Units by mouth every 7 (seven) days.      Follow-up Information    Frank Signs, MD. Schedule an appointment as soon as possible for a visit on 01/05/2017.   Specialty:  General Surgery Contact information: [id number] [address] [phone]           Signed: [name] 12/27/2016, 8:43 AM

## 2016-12-27 NOTE — Discharge Instructions (Signed)
Open Hernia Repair, Adult, Care After °These instructions give you information about caring for yourself after your procedure. Your doctor may also give you more specific instructions. If you have problems or questions, contact your doctor. °Follow these instructions at home: °Surgical cut (incision) care  ° °· Follow instructions from your doctor about how to take care of your surgical cut area. Make sure you: °¨ Wash your hands with soap and water before you change your bandage (dressing). If you cannot use soap and water, use hand sanitizer. °¨ Change your bandage as told by your doctor. °¨ Leave stitches (sutures), skin glue, or skin tape (adhesive) strips in place. They may need to stay in place for 2 weeks or longer. If tape strips get loose and curl up, you may trim the loose edges. Do not remove tape strips completely unless your doctor says it is okay. °· Check your surgical cut every day for signs of infection. Check for: °¨ More redness, swelling, or pain. °¨ More fluid or blood. °¨ Warmth. °¨ Pus or a bad smell. °Activity  °· Do not drive or use heavy machinery while taking prescription pain medicine. Do not drive until your doctor says it is okay. °· Until your doctor says it is okay: °¨ Do not lift anything that is heavier than 10 lb (4.5 kg). °¨ Do not play contact sports. °· Return to your normal activities as told by your doctor. Ask your doctor what activities are safe. °General instructions  °· To prevent or treat having a hard time pooping (constipation) while you are taking prescription pain medicine, your doctor may recommend that you: °¨ Drink enough fluid to keep your pee (urine) clear or pale yellow. °¨ Take over-the-counter or prescription medicines. °¨ Eat foods that are high in fiber, such as fresh fruits and vegetables, whole grains, and beans. °¨ Limit foods that are high in fat and processed sugars, such as fried and sweet foods. °· Take over-the-counter and prescription medicines only  as told by your doctor. °· Do not take baths, swim, or use a hot tub until your doctor says it is okay. °· Keep all follow-up visits as told by your doctor. This is important. °Contact a doctor if: °· You develop a rash. °· You have more redness, swelling, or pain around your surgical cut. °· You have more fluid or blood coming from your surgical cut. °· Your surgical cut feels warm to the touch. °· You have pus or a bad smell coming from your surgical cut. °· You have a fever or chills. °· You have blood in your poop (stool). °· You have not pooped in 2-3 days. °· Medicine does not help your pain. °Get help right away if: °· You have chest pain or you are short of breath. °· You feel light-headed. °· You feel weak and dizzy (feel faint). °· You have very bad pain. °· You throw up (vomit) and your pain is worse. °This information is not intended to replace advice given to you by your health care provider. Make sure you discuss any questions you have with your health care provider. °Document Released: 03/14/2014 Document Revised: 09/11/2015 Document Reviewed: 08/05/2015 °Elsevier Interactive Patient Education © 2017 [employer] ° °

## 2016-12-27 NOTE — Care Management Important Message (Signed)
Important Message  Patient Details  Name: Frank Levine MRN: [id number] Date of Birth: [birth year]   Medicare Important Message Given:  Yes    Dusten Ellinwood, Chauncey Reading, RN 12/27/2016, 9:04 AM

## 2016-12-27 NOTE — Progress Notes (Signed)
Discharge instructions read to patient and his mother.  Both verbalized understanding of instructions.  Discharged to home with mother.

## 2016-12-27 NOTE — Care Management (Signed)
Patient discharging home in care of family. No CM needs.

## 2016-12-28 ENCOUNTER — Encounter (HOSPITAL_COMMUNITY): Payer: Self-pay | Admitting: General Surgery

## 2016-12-28 MED ORDER — POVIDONE-IODINE 10 % OINT PACKET
TOPICAL_OINTMENT | CUTANEOUS | Status: DC | PRN
  Administered 2016-12-26: 1 via TOPICAL

## 2017-01-05 ENCOUNTER — Ambulatory Visit (INDEPENDENT_AMBULATORY_CARE_PROVIDER_SITE_OTHER): Payer: Self-pay | Admitting: General Surgery

## 2017-01-05 ENCOUNTER — Encounter: Payer: Self-pay | Admitting: General Surgery

## 2017-01-05 VITALS — BP 88/58 | HR 85 | Temp 97.8°F | Resp 18 | Ht 60.0 in | Wt 168.0 lb

## 2017-01-05 DIAGNOSIS — Z09 Encounter for follow-up examination after completed treatment for conditions other than malignant neoplasm: Secondary | ICD-10-CM

## 2017-01-05 NOTE — Progress Notes (Signed)
Subjective:     Frank Levine  Status post ventral herniorrhaphy with mesh.  Doing well.  Has no complaints. Objective:    BP (!) 88/58   Pulse 85   Temp 97.8 F (36.6 C)   Resp 18   Ht 5' (1.524 m)   Wt 168 lb (76.2 kg)   BMI 32.81 kg/m   General:  alert, cooperative and no distress  Abdomen soft, incision healing well.  Staples removed, Steri-Strips applied.     Assessment:    Doing well postoperatively.    Plan:   Increase activity as able.  May return to work without restrictions on 01/16/2017.  Follow-up here as needed.

## 2017-01-17 DIAGNOSIS — B351 Tinea unguium: Secondary | ICD-10-CM | POA: Diagnosis not present

## 2017-01-17 DIAGNOSIS — M79676 Pain in unspecified toe(s): Secondary | ICD-10-CM | POA: Diagnosis not present

## 2017-01-20 DIAGNOSIS — R339 Retention of urine, unspecified: Secondary | ICD-10-CM | POA: Diagnosis not present

## 2017-01-20 DIAGNOSIS — N401 Enlarged prostate with lower urinary tract symptoms: Secondary | ICD-10-CM | POA: Diagnosis not present

## 2017-01-20 DIAGNOSIS — N1330 Unspecified hydronephrosis: Secondary | ICD-10-CM | POA: Diagnosis not present

## 2017-01-20 DIAGNOSIS — N3020 Other chronic cystitis without hematuria: Secondary | ICD-10-CM | POA: Diagnosis not present

## 2017-02-21 DIAGNOSIS — N1339 Other hydronephrosis: Secondary | ICD-10-CM | POA: Diagnosis not present

## 2017-03-09 DIAGNOSIS — N183 Chronic kidney disease, stage 3 (moderate): Secondary | ICD-10-CM | POA: Diagnosis not present

## 2017-03-09 DIAGNOSIS — I10 Essential (primary) hypertension: Secondary | ICD-10-CM | POA: Diagnosis not present

## 2017-03-09 DIAGNOSIS — E559 Vitamin D deficiency, unspecified: Secondary | ICD-10-CM | POA: Diagnosis not present

## 2017-03-09 DIAGNOSIS — R809 Proteinuria, unspecified: Secondary | ICD-10-CM | POA: Diagnosis not present

## 2017-03-09 DIAGNOSIS — Z79899 Other long term (current) drug therapy: Secondary | ICD-10-CM | POA: Diagnosis not present

## 2017-03-09 DIAGNOSIS — D509 Iron deficiency anemia, unspecified: Secondary | ICD-10-CM | POA: Diagnosis not present

## 2017-03-14 DIAGNOSIS — N183 Chronic kidney disease, stage 3 (moderate): Secondary | ICD-10-CM | POA: Diagnosis not present

## 2017-03-14 DIAGNOSIS — E870 Hyperosmolality and hypernatremia: Secondary | ICD-10-CM | POA: Diagnosis not present

## 2017-03-14 DIAGNOSIS — R809 Proteinuria, unspecified: Secondary | ICD-10-CM | POA: Diagnosis not present

## 2017-03-14 DIAGNOSIS — Z8279 Family history of other congenital malformations, deformations and chromosomal abnormalities: Secondary | ICD-10-CM | POA: Insufficient documentation

## 2017-03-14 DIAGNOSIS — N1330 Unspecified hydronephrosis: Secondary | ICD-10-CM | POA: Diagnosis not present

## 2017-03-14 DIAGNOSIS — M1990 Unspecified osteoarthritis, unspecified site: Secondary | ICD-10-CM | POA: Insufficient documentation

## 2017-03-21 ENCOUNTER — Encounter: Payer: Self-pay | Admitting: *Deleted

## 2017-03-21 ENCOUNTER — Ambulatory Visit (INDEPENDENT_AMBULATORY_CARE_PROVIDER_SITE_OTHER): Payer: Medicare Other | Admitting: *Deleted

## 2017-03-21 VITALS — BP 78/50 | HR 87 | Temp 98.6°F | Ht 60.5 in | Wt 174.0 lb

## 2017-03-21 DIAGNOSIS — Z0000 Encounter for general adult medical examination without abnormal findings: Secondary | ICD-10-CM | POA: Diagnosis not present

## 2017-03-21 NOTE — Patient Instructions (Signed)
  Frank Levine , Thank you for taking time to come for your Medicare Wellness Visit. I appreciate your ongoing commitment to your health goals. Please review the following plan we discussed and let me know if I can assist you in the future.   These are the goals we discussed: Continue to stay active.  This is a list of the screening recommended for you and due dates:  Health Maintenance  Topic Date Due  . Colon Cancer Screening  07/13/2009  . Tetanus Vaccine  04/01/2024  . Flu Shot  Completed  .  Hepatitis C: One time screening is recommended by Center for Disease Control  (CDC) for  adults born from [birth year] through 1966.   Completed  . HIV Screening  Completed   Consider Cologuard for colon cancer screening. Give me a call if you decide you would like a kit sent to your home.  [name]  [phone]

## 2017-03-24 NOTE — Progress Notes (Signed)
Subjective:   Frank Levine is a 57 y.o. male who presents for a subsequent Medicare Annual Wellness Visit. Mr Frank Levine has Down's Syndrome and lives at home with his elderly mother. He works part time at [employer] and really enjoys going to work.   Review of Systems  Patient's health is about the same as last year.   Cardiac Risk Factors include: advanced age (>43men, >39 women);dyslipidemia;male gender;obesity (BMI >30kg/m2)  Hypotensive today but in line with his normal readings. He is asymptomatic.   Other systems negative today.    Objective:    Today's Vitals   03/21/17 1526  BP: (!) 78/50  Pulse: 87  Temp: 98.6 F (37 C)  TempSrc: Oral  Weight: 174 lb (78.9 kg)  Height: 5' 0.5" (1.537 m)   Body mass index is 33.42 kg/m.  Advanced Directives 12/25/2016 12/25/2016 05/20/2016 05/20/2016 08/25/2015 06/03/2015 07/02/2014  Does Patient Have a Medical Advance Directive? No No No No No No No  Type of Advance Directive - - - - - - -  Copy of Healthcare Power of Attorney in Chart? - - - - - - -  Would patient like information on creating a medical advance directive? No - Patient declined - No - Patient declined - - No - patient declined information Yes - Educational materials given    Current Medications (verified) Outpatient Encounter Medications as of 03/21/2017  Medication Sig  . aspirin EC 81 MG tablet Take 81 mg by mouth daily.   Marland Kitchen docusate sodium (COLACE) 100 MG capsule Take 100 mg by mouth at bedtime.   . pantoprazole (PROTONIX) 40 MG tablet Take 1 Tablet by mouth every morning  . simvastatin (ZOCOR) 40 MG tablet Take 1 tablet by mouth once daily at 6 PM  . tamsulosin (FLOMAX) 0.4 MG CAPS capsule Take 1 Capsule by mouth every morning  . Vitamin D, Ergocalciferol, (DRISDOL) 50000 units CAPS capsule Take 50,000 Units by mouth every 7 (seven) days.  Marland Kitchen HYDROcodone-acetaminophen (NORCO) 5-325 MG tablet Take 1 tablet by mouth every 4 (four) hours as needed for moderate pain.   No  facility-administered encounter medications on file as of 03/21/2017.     Allergies (verified) Patient has no known allergies.   History: Past Medical History:  Diagnosis Date  . Anxiety   . Arthritis   . Bilateral hydronephrosis   . BPH (benign prostatic hypertrophy)   . Chronic cystitis   . Chronic urethral stricture    urologist-  dr Risa Grill; has suprapubic catheter  . CKD (chronic kidney disease), stage II   . Down's syndrome 09/27/2011  . GERD (gastroesophageal reflux disease)    OCCASIONAL  . History of DVT of lower extremity    POST GU SURGERY  2013  &  2011  . History of Meckel's diverticulum    BLADDER  . History of pulmonary embolus (PE)    DEC 2011  [place] GU SURG[place] --  [place]  FOR 6 MONTHS  . Hyperlipemia   . Mental retardation   . Neurogenic bladder   . Wears glasses    Past Surgical History:  Procedure Laterality Date  . BALLOON DILATION N/A 06/03/2015   Procedure: BALLOON DILATION;  Surgeon: Rana Snare, MD;  Location: [facility];  Service: Urology;  Laterality: N/A;  . CYSTO/  BALLOON DILATION URETHRAL STRICTURE/  REMOVAL & REPLACEMENT RIGHT URETERAL STETN/  RIGHT RETROGRADE PYELOGRAM/   LEFT URETEROSCOPY/  ATTEMPTED REMOVAL LEFT STENT  05-10-2010  . CYSTO/  BALLOON DILATION  URETHRAL STRICTURE/  URETEROSCOPY/ REMOVAL LEFT STENT  10-17-2010  . CYSTO/  BALLOON DILATTION URETHRAL STRICTURE/ BILATERAL RETROGRADE PYELOGRAM/ BILATERAL URETERAL STENTS/ BLADDER BX/  LEFT URETEROSCOPY  01-27-2010  . CYSTOGRAM N/A 07/10/2013   Procedure: CYSTOGRAM;  Surgeon: Bernestine Amass, MD;  Location: [facility];  Service: Urology;  Laterality: N/A;  . CYSTOGRAM N/A 07/02/2014   Procedure: CYSTOGRAM;  Surgeon: Rana Snare, MD;  Location: [facility];  Service: Urology;  Laterality: N/A;  . CYSTOSCOPY W/ RETROGRADES Right 06/03/2015   Procedure: CYSTOSCOPY WITH RIGHT RETROGRADE PYELOGRAM CYSTOGRAM;  Surgeon: Rana Snare, MD;  Location: [facility];  Service: Urology;  Laterality: Right;  . CYSTOSCOPY WITH URETHRAL DILATATION N/A 07/10/2013   Procedure: CYSTOSCOPY WITH URETHRAL DILATATION, BALLOON DILITATION, RIGHT  RETROGRADE;  Surgeon: Bernestine Amass, MD;  Location: [facility];  Service: Urology;  Laterality: N/A;  . CYSTOSCOPY WITH URETHRAL DILATATION N/A 07/02/2014   Procedure: CYSTOSCOPY WITH URETHRAL DILATATION;  Surgeon: Rana Snare, MD;  Location: [facility];  Service: Urology;  Laterality: N/A;  . INSERTION OF SUPRAPUBIC CATHETER N/A 06/03/2015   Procedure: INSERTION OF SUPRAPUBIC CATHETER;  Surgeon: Rana Snare, MD;  Location: [facility];  Service: Urology;  Laterality: N/A;  . VENTRAL HERNIA REPAIR N/A 12/26/2016   Procedure: HERNIA REPAIR VENTRAL ADULT;  Surgeon: Aviva Signs, MD;  Location: [facility];  Service: General;  Laterality: N/A;   Family History  Problem Relation Age of Onset  . Hypertension Mother   . Hyperlipidemia Mother   . Cancer Father    Social History   Socioeconomic History  . Marital status: Single    Spouse name: Not on file  . Number of children: Not on file  . Years of education: 44  . Highest education level: 10th grade  Social Needs  . Financial resource strain: Not very hard  . Food insecurity - worry: Never true  . Food insecurity - inability: Never true  . Transportation needs - medical: No  . Transportation needs - non-medical: No  Occupational History  . Occupation: part-time Glass blower/designer at [employer]  . Smoking status: Never Smoker  . Smokeless tobacco: Never Used  Substance and Sexual Activity  . Alcohol use: No  . Drug use: No  . Sexual activity: No  Other Topics Concern  . Not on file  Social History Narrative   Patient is disabled and lives at home with his mother in a one story home. He has a part time job as Archivist and meets with a specials needs group once a month at the local recreation department.  He has a 4 wheeler that he enjoys riding at home.    Tobacco Counseling No tobacco use  Clinical Intake:  Pre-visit preparation completed: No  Pain : No/denies pain     Nutritional Status: BMI > 30  Obese Diabetes: No  Activities of Daily Living: (!) Needs Assist Feeding: Independent Dressing/Grooming: Independent Bathing: Independent Toileting: Independent Transfer: Independent Ambulation: Independent Medication Administration: Needs assistance (comment)(Mother keeps up with his medications and lays them out for him daily. He takes them on his own. ) Home Management: Needs assistance (comment)  Barriers to Care Management & Learning: Literacy level, Cognitive impairments  Do you feel unsafe in your current relationship?: Yes Do you feel physically threatened by others?: No Anyone hurting you at home, work, or school?: No Unable to ask?: No Information provided on Community resources: No  How often  do you need to have someone help you when you read instructions, pamphlets, or other written materials from your doctor or pharmacy?: 5 - Always What is the last grade level you completed in school?: Special Education Classes through [employer]?: No  Information entered by :: Chong Sicilian, RN    Activities of Daily Living In your present state of health, do you have any difficulty performing the following activities: 03/21/2017 12/25/2016  Hearing? N N  Vision? Y N  Comment Last exam was in summer 2018. Anthony Sar, OD -  Difficulty concentrating or making decisions? N N  Walking or climbing stairs? N N  Dressing or bathing? N N  Doing errands, shopping? N N  Preparing Food and eating ? N -  Comment Mom fixes vegetables for supper and he normally fixes his own lunch. He doesn't cook on the stove.  -  Using the Toilet? N -  In the past six months, have you accidently leaked urine? N -  Do you have problems with loss of bowel control? N -  Managing  your Medications? Y -  Comment His mother lays his medications out for him but he knows if he is missing one -  Managing your Finances? Y -  Housekeeping or managing your Housekeeping? N -  Comment Helps his mother around the house -  Some recent data might be hidden     Immunizations and Health Maintenance Immunization History  Administered Date(s) Administered  . Influenza, Seasonal, Injecte, Preservative Fre 12/11/2013  . Influenza,inj,Quad PF,6+ Mos 01/03/2013, 12/17/2015, 12/27/2016  . Influenza-Unspecified 12/17/2014  . Tdap 04/01/2014   Health Maintenance Due  Topic Date Due  . COLONOSCOPY  07/13/2009    Patient Care Team: Dettinger, Fransisca Kaufmann, MD as PCP - General (Family Medicine)  Indicate any recent Medical Services you may have received from other than [facility] providers in the past year (date may be approximate).    Assessment:   This is a routine wellness examination for [place].  Hearing/Vision screen No exam data present  Dietary issues and exercise activities discussed: Current Exercise Habits: Home exercise routine, Type of exercise: walking, Time (Minutes): 30, Frequency (Times/Week): 4, Weekly Exercise (Minutes/Week): 120, Intensity: Mild, Exercise limited by: Other - see comments  Goals Continue to stay active daily. Aim for 150 minutes of moderate activity a week  Depression Screen PHQ 2/9 Scores 03/21/2017 11/02/2016 08/17/2016 06/01/2016  PHQ - 2 Score 0 0 0 0    Fall Risk Fall Risk  03/21/2017 11/02/2016 12/03/2015 12/01/2015 10/02/2014  Falls in the past year? No No No No No    Is the patient's home free of loose throw rugs in walkways, pet beds, electrical cords, etc?   yes      Grab bars in the bathroom? no      Handrails on the stairs?   yes      Adequate lighting?   yes   Cognitive Function:  Unable to complete due to Down's Syndrome.  Mini Cog was attempted but is most likely innacurate as well. Patient was able to repeat the three words but  didn't recall them later. He is able to read a manual clock but isn't able to draw one.     Screening Tests Health Maintenance  Topic Date Due  . COLONOSCOPY  07/13/2009  . TETANUS/TDAP  04/01/2024  . INFLUENZA VACCINE  Completed  . Hepatitis C Screening  Completed  . HIV Screening  Completed  Plan:  Continue to stay socially and physically active.  Keep f/u with PCP and specialists   I have personally reviewed and noted the following in the patient's chart:   . Medical and social history . Use of alcohol, tobacco or illicit drugs  . Current medications and supplements . Functional ability and status . Nutritional status . Physical activity . Advanced directives . List of other physicians . Hospitalizations, surgeries, and ER visits in previous 12 months . Vitals . Screenings to include cognitive, depression, and falls . Referrals and appointments  In addition, I have reviewed and discussed with patient certain preventive protocols, quality metrics, and best practice recommendations. A written personalized care plan for preventive services as well as general preventive health recommendations were provided to patient.     Chong Sicilian, RN   03/21/17

## 2017-03-28 DIAGNOSIS — B351 Tinea unguium: Secondary | ICD-10-CM | POA: Diagnosis not present

## 2017-03-28 DIAGNOSIS — M79676 Pain in unspecified toe(s): Secondary | ICD-10-CM | POA: Diagnosis not present

## 2017-03-29 DIAGNOSIS — N35812 Other urethral bulbous stricture, male: Secondary | ICD-10-CM | POA: Diagnosis not present

## 2017-04-13 DIAGNOSIS — M7581 Other shoulder lesions, right shoulder: Secondary | ICD-10-CM | POA: Diagnosis not present

## 2017-04-25 ENCOUNTER — Other Ambulatory Visit: Payer: Self-pay | Admitting: Family Medicine

## 2017-04-25 DIAGNOSIS — K219 Gastro-esophageal reflux disease without esophagitis: Secondary | ICD-10-CM

## 2017-04-27 DIAGNOSIS — N35812 Other urethral bulbous stricture, male: Secondary | ICD-10-CM | POA: Diagnosis not present

## 2017-04-27 DIAGNOSIS — N3020 Other chronic cystitis without hematuria: Secondary | ICD-10-CM | POA: Diagnosis not present

## 2017-04-27 DIAGNOSIS — N1339 Other hydronephrosis: Secondary | ICD-10-CM | POA: Diagnosis not present

## 2017-05-04 ENCOUNTER — Ambulatory Visit (INDEPENDENT_AMBULATORY_CARE_PROVIDER_SITE_OTHER): Payer: Medicare Other | Admitting: Family Medicine

## 2017-05-04 ENCOUNTER — Encounter: Payer: Self-pay | Admitting: Family Medicine

## 2017-05-04 VITALS — BP 97/71 | HR 99 | Temp 98.0°F | Ht 60.5 in | Wt 171.4 lb

## 2017-05-04 DIAGNOSIS — K219 Gastro-esophageal reflux disease without esophagitis: Secondary | ICD-10-CM

## 2017-05-04 DIAGNOSIS — N183 Chronic kidney disease, stage 3 unspecified: Secondary | ICD-10-CM

## 2017-05-04 DIAGNOSIS — E785 Hyperlipidemia, unspecified: Secondary | ICD-10-CM | POA: Diagnosis not present

## 2017-05-04 NOTE — Progress Notes (Signed)
BP 97/71   Pulse 99   Temp 98 F (36.7 C) (Oral)   Ht 5' 0.5" (1.537 m)   Wt 171 lb 6 oz (77.7 kg)   BMI 32.92 kg/m    Subjective:    Patient ID: Frank Levine, male    DOB: 1961/02/04, 57 y.o.   MRN: [id number]  HPI: Frank Levine is a 57 y.o. male presenting on 05/04/2017 for Hyperlipidemia (6 mo) and Gastroesophageal Reflux   HPI Hyperlipidemia Patient is coming in for recheck of his hyperlipidemia. The patient is currently taking simvastatin. They deny any issues with myalgias or history of liver damage from it. They deny any focal numbness or weakness or chest pain.   GERD Patient is currently on Protonix.  She denies any major symptoms or abdominal pain or belching or burping. She denies any blood in her stool or lightheadedness or dizziness.   CKD Patient has chronic kidney disease and a suprapubic catheter and sees a urologist regularly and a nephrologist now.  He is CKD has been slightly worsening over the past few blood draws, we will recheck today and send results to [facility]  Relevant past medical, surgical, family and social history reviewed and updated as indicated. Interim medical history since our last visit reviewed. Allergies and medications reviewed and updated.  Review of Systems  Constitutional: Negative for chills and fever.  Respiratory: Negative for shortness of breath and wheezing.   Cardiovascular: Negative for chest pain and leg swelling.  Gastrointestinal: Negative for abdominal pain.  Genitourinary: Negative for dysuria.  Musculoskeletal: Negative for back pain and gait problem.  Skin: Negative for rash.  Neurological: Negative for dizziness, weakness and light-headedness.  All other systems reviewed and are negative.   Per HPI unless specifically indicated above   Allergies as of 05/04/2017   No Known Allergies     Medication List        Accurate as of 05/04/17  9:02 AM. Always use your most recent med list.          aspirin  EC 81 MG tablet Take 81 mg by mouth daily.   docusate sodium 100 MG capsule Commonly known as:  COLACE Take 100 mg by mouth at bedtime.   pantoprazole 40 MG tablet Commonly known as:  PROTONIX TAKE 1 TABLET BY MOUTH EVERY MORNING   simvastatin 40 MG tablet Commonly known as:  ZOCOR Take 1 tablet by mouth once daily at 6 PM   tamsulosin 0.4 MG Caps capsule Commonly known as:  FLOMAX Take 1 Capsule by mouth every morning   Vitamin D (Ergocalciferol) 50000 units Caps capsule Commonly known as:  DRISDOL Take 50,000 Units by mouth every 7 (seven) days.          Objective:    BP 97/71   Pulse 99   Temp 98 F (36.7 C) (Oral)   Ht 5' 0.5" (1.537 m)   Wt 171 lb 6 oz (77.7 kg)   BMI 32.92 kg/m   Wt Readings from Last 3 Encounters:  05/04/17 171 lb 6 oz (77.7 kg)  03/21/17 174 lb (78.9 kg)  01/05/17 168 lb (76.2 kg)    Physical Exam  Constitutional: He is oriented to person, place, and time. He appears well-developed and well-nourished. No distress.  Eyes: Conjunctivae are normal. No scleral icterus.  Neck: Neck supple. No thyromegaly present.  Cardiovascular: Normal rate, regular rhythm, normal heart sounds and intact distal pulses.  No murmur heard. Pulmonary/Chest: Effort normal and breath  sounds normal. No respiratory distress. He has no wheezes. He has no rales.  Abdominal: Soft. Bowel sounds are normal. He exhibits no distension. There is no tenderness. There is no rebound.  Musculoskeletal: Normal range of motion. He exhibits no edema.  Lymphadenopathy:    He has no cervical adenopathy.  Neurological: He is alert and oriented to person, place, and time. Coordination normal.  Skin: Skin is warm and dry. No rash noted. He is not diaphoretic.  Psychiatric: He has a normal mood and affect. His behavior is normal.  Nursing note and vitals reviewed.       Assessment & Plan:   Problem List Items Addressed This Visit      Digestive   GERD (gastroesophageal reflux  disease)   Relevant Orders   CBC with Differential/Platelet   TSH     Genitourinary   CKD (chronic kidney disease) stage 3, GFR 30-59 ml/min (HCC) (Chronic)   Relevant Orders   CMP14+EGFR   TSH     Other   Hyperlipidemia - Primary   Relevant Orders   Lipid panel   TSH       Follow up plan: Return in about 6 months (around 11/01/2017), or if symptoms worsen or fail to improve, for Cholesterol and GERD.  Counseling provided for all of the vaccine components Orders Placed This Encounter  Procedures  . CMP14+EGFR  . CBC with Differential/Platelet  . Lipid panel  . TSH    Caryl Pina, MD [facility] 05/04/2017, 9:02 AM

## 2017-05-05 LAB — CBC WITH DIFFERENTIAL/PLATELET
BASOS: 1 %
Basophils Absolute: 0.1 10*3/uL (ref 0.0–0.2)
EOS (ABSOLUTE): 0.2 10*3/uL (ref 0.0–0.4)
Eos: 3 %
Hematocrit: 41.6 % (ref 37.5–51.0)
Hemoglobin: 13.9 g/dL (ref 13.0–17.7)
IMMATURE GRANS (ABS): 0 10*3/uL (ref 0.0–0.1)
Immature Granulocytes: 0 %
LYMPHS: 15 %
Lymphocytes Absolute: 0.7 10*3/uL (ref 0.7–3.1)
MCH: 33.1 pg — AB (ref 26.6–33.0)
MCHC: 33.4 g/dL (ref 31.5–35.7)
MCV: 99 fL — AB (ref 79–97)
Monocytes Absolute: 0.5 10*3/uL (ref 0.1–0.9)
Monocytes: 11 %
Neutrophils Absolute: 3.3 10*3/uL (ref 1.4–7.0)
Neutrophils: 70 %
PLATELETS: 242 10*3/uL (ref 150–379)
RBC: 4.2 x10E6/uL (ref 4.14–5.80)
RDW: 14.5 % (ref 12.3–15.4)
WBC: 4.8 10*3/uL (ref 3.4–10.8)

## 2017-05-05 LAB — CMP14+EGFR
A/G RATIO: 0.9 — AB (ref 1.2–2.2)
ALT: 8 IU/L (ref 0–44)
AST: 17 IU/L (ref 0–40)
Albumin: 3.5 g/dL (ref 3.5–5.5)
Alkaline Phosphatase: 89 IU/L (ref 39–117)
BUN/Creatinine Ratio: 10 (ref 9–20)
BUN: 18 mg/dL (ref 6–24)
Bilirubin Total: 0.6 mg/dL (ref 0.0–1.2)
CALCIUM: 9.1 mg/dL (ref 8.7–10.2)
CO2: 25 mmol/L (ref 20–29)
Chloride: 106 mmol/L (ref 96–106)
Creatinine, Ser: 1.89 mg/dL — ABNORMAL HIGH (ref 0.76–1.27)
GFR calc Af Amer: 45 mL/min/{1.73_m2} — ABNORMAL LOW (ref >59)
GFR, EST NON AFRICAN AMERICAN: 39 mL/min/{1.73_m2} — AB (ref >59)
GLOBULIN, TOTAL: 3.8 g/dL (ref 1.5–4.5)
Glucose: 93 mg/dL (ref 65–99)
POTASSIUM: 5.4 mmol/L — AB (ref 3.5–5.2)
SODIUM: 146 mmol/L — AB (ref 134–144)
TOTAL PROTEIN: 7.3 g/dL (ref 6.0–8.5)

## 2017-05-05 LAB — LIPID PANEL
CHOL/HDL RATIO: 2.5 ratio (ref 0.0–5.0)
Cholesterol, Total: 155 mg/dL (ref 100–199)
HDL: 61 mg/dL (ref >39)
LDL CALC: 83 mg/dL (ref 0–99)
Triglycerides: 56 mg/dL (ref 0–149)
VLDL CHOLESTEROL CAL: 11 mg/dL (ref 5–40)

## 2017-05-05 LAB — TSH: TSH: 2.91 u[IU]/mL (ref 0.450–4.500)

## 2017-05-25 DIAGNOSIS — R339 Retention of urine, unspecified: Secondary | ICD-10-CM | POA: Diagnosis not present

## 2017-05-31 ENCOUNTER — Other Ambulatory Visit: Payer: Self-pay | Admitting: Physician Assistant

## 2017-06-06 DIAGNOSIS — B351 Tinea unguium: Secondary | ICD-10-CM | POA: Diagnosis not present

## 2017-06-06 DIAGNOSIS — M79676 Pain in unspecified toe(s): Secondary | ICD-10-CM | POA: Diagnosis not present

## 2017-06-22 DIAGNOSIS — R339 Retention of urine, unspecified: Secondary | ICD-10-CM | POA: Diagnosis not present

## 2017-07-11 DIAGNOSIS — N183 Chronic kidney disease, stage 3 (moderate): Secondary | ICD-10-CM | POA: Diagnosis not present

## 2017-07-11 DIAGNOSIS — Z79899 Other long term (current) drug therapy: Secondary | ICD-10-CM | POA: Diagnosis not present

## 2017-07-11 DIAGNOSIS — R809 Proteinuria, unspecified: Secondary | ICD-10-CM | POA: Diagnosis not present

## 2017-07-11 DIAGNOSIS — D509 Iron deficiency anemia, unspecified: Secondary | ICD-10-CM | POA: Diagnosis not present

## 2017-07-11 DIAGNOSIS — I10 Essential (primary) hypertension: Secondary | ICD-10-CM | POA: Diagnosis not present

## 2017-07-11 DIAGNOSIS — E559 Vitamin D deficiency, unspecified: Secondary | ICD-10-CM | POA: Diagnosis not present

## 2017-07-18 DIAGNOSIS — R809 Proteinuria, unspecified: Secondary | ICD-10-CM | POA: Diagnosis not present

## 2017-07-18 DIAGNOSIS — E875 Hyperkalemia: Secondary | ICD-10-CM | POA: Diagnosis not present

## 2017-07-18 DIAGNOSIS — D509 Iron deficiency anemia, unspecified: Secondary | ICD-10-CM | POA: Diagnosis not present

## 2017-07-18 DIAGNOSIS — N183 Chronic kidney disease, stage 3 (moderate): Secondary | ICD-10-CM | POA: Diagnosis not present

## 2017-07-18 DIAGNOSIS — E559 Vitamin D deficiency, unspecified: Secondary | ICD-10-CM | POA: Diagnosis not present

## 2017-07-18 DIAGNOSIS — E870 Hyperosmolality and hypernatremia: Secondary | ICD-10-CM | POA: Diagnosis not present

## 2017-07-18 DIAGNOSIS — N1330 Unspecified hydronephrosis: Secondary | ICD-10-CM | POA: Diagnosis not present

## 2017-07-27 DIAGNOSIS — R339 Retention of urine, unspecified: Secondary | ICD-10-CM | POA: Diagnosis not present

## 2017-08-15 DIAGNOSIS — M79676 Pain in unspecified toe(s): Secondary | ICD-10-CM | POA: Diagnosis not present

## 2017-08-15 DIAGNOSIS — B351 Tinea unguium: Secondary | ICD-10-CM | POA: Diagnosis not present

## 2017-08-31 DIAGNOSIS — R339 Retention of urine, unspecified: Secondary | ICD-10-CM | POA: Diagnosis not present

## 2017-09-28 DIAGNOSIS — R339 Retention of urine, unspecified: Secondary | ICD-10-CM | POA: Diagnosis not present

## 2017-10-26 DIAGNOSIS — N1330 Unspecified hydronephrosis: Secondary | ICD-10-CM | POA: Diagnosis not present

## 2017-10-26 DIAGNOSIS — R3914 Feeling of incomplete bladder emptying: Secondary | ICD-10-CM | POA: Diagnosis not present

## 2017-10-31 DIAGNOSIS — M79676 Pain in unspecified toe(s): Secondary | ICD-10-CM | POA: Diagnosis not present

## 2017-10-31 DIAGNOSIS — B351 Tinea unguium: Secondary | ICD-10-CM | POA: Diagnosis not present

## 2017-11-02 ENCOUNTER — Ambulatory Visit: Payer: Medicare Other | Admitting: Family Medicine

## 2017-11-16 ENCOUNTER — Ambulatory Visit (INDEPENDENT_AMBULATORY_CARE_PROVIDER_SITE_OTHER): Payer: Medicare HMO | Admitting: Family Medicine

## 2017-11-16 ENCOUNTER — Encounter: Payer: Self-pay | Admitting: Family Medicine

## 2017-11-16 VITALS — BP 95/59 | HR 66 | Temp 97.7°F | Ht 60.5 in | Wt 168.6 lb

## 2017-11-16 DIAGNOSIS — Q909 Down syndrome, unspecified: Secondary | ICD-10-CM

## 2017-11-16 DIAGNOSIS — N183 Chronic kidney disease, stage 3 unspecified: Secondary | ICD-10-CM

## 2017-11-16 DIAGNOSIS — E782 Mixed hyperlipidemia: Secondary | ICD-10-CM

## 2017-11-16 DIAGNOSIS — K219 Gastro-esophageal reflux disease without esophagitis: Secondary | ICD-10-CM | POA: Diagnosis not present

## 2017-11-16 MED ORDER — TAMSULOSIN HCL 0.4 MG PO CAPS: 0.4000 mg | ORAL_CAPSULE | Freq: Every morning | ORAL | 3 refills | Status: DC

## 2017-11-16 MED ORDER — SIMVASTATIN 40 MG PO TABS: ORAL_TABLET | ORAL | 3 refills | Status: DC

## 2017-11-16 NOTE — Progress Notes (Signed)
BP (!) 95/59   Pulse 66   Temp 97.7 F (36.5 C) (Oral)   Ht 5' 0.5" (1.537 m)   Wt 168 lb 9.6 oz (76.5 kg)   BMI 32.39 kg/m    Subjective:    Patient ID: Frank Levine, male    DOB: May 21, 1960, 57 y.o.   MRN: [id number]  HPI: Frank Levine is a 57 y.o. male presenting on 11/16/2017 for Hyperlipidemia (6 month follow up)   HPI Hyperlipidemia Patient is coming in for recheck of his hyperlipidemia. The patient is currently taking simvastatin. They deny any issues with myalgias or history of liver damage from it. They deny any focal numbness or weakness or chest pain.   GERD Patient is currently on Protonix.  She denies any major symptoms or abdominal pain or belching or burping. She denies any blood in her stool or lightheadedness or dizziness.   Patient has CKD and Down syndrome, the CKD has been been managed by a nephrologist in the Down syndrome with something that they know of and we have monitored some of the risk factors.  It does seem like he is starting to develop some memory issues but not too significantly at this point.  Relevant past medical, surgical, family and social history reviewed and updated as indicated. Interim medical history since our last visit reviewed. Allergies and medications reviewed and updated.  Review of Systems  Constitutional: Negative for chills and fever.  Eyes: Negative for visual disturbance.  Respiratory: Negative for shortness of breath and wheezing.   Cardiovascular: Negative for chest pain and leg swelling.  Musculoskeletal: Negative for back pain and gait problem.  Skin: Negative for rash.  Neurological: Negative for dizziness, weakness and light-headedness.  All other systems reviewed and are negative.   Per HPI unless specifically indicated above   Allergies as of 11/16/2017   No Known Allergies     Medication List        Accurate as of 11/16/17 12:18 PM. Always use your most recent med list.          aspirin EC 81 MG  tablet Take 81 mg by mouth daily.   docusate sodium 100 MG capsule Commonly known as:  COLACE Take 100 mg by mouth at bedtime.   pantoprazole 40 MG tablet Commonly known as:  PROTONIX TAKE 1 TABLET BY MOUTH EVERY MORNING   simvastatin 40 MG tablet Commonly known as:  ZOCOR TAKE 1 TABLET BY MOUTH ONCE DAILY AT 6PM   tamsulosin 0.4 MG Caps capsule Commonly known as:  FLOMAX Take 1 capsule (0.4 mg total) by mouth every morning.   Vitamin D (Ergocalciferol) 50000 units Caps capsule Commonly known as:  DRISDOL Take 50,000 Units by mouth every 7 (seven) days.          Objective:    BP (!) 95/59   Pulse 66   Temp 97.7 F (36.5 C) (Oral)   Ht 5' 0.5" (1.537 m)   Wt 168 lb 9.6 oz (76.5 kg)   BMI 32.39 kg/m   Wt Readings from Last 3 Encounters:  11/16/17 168 lb 9.6 oz (76.5 kg)  05/04/17 171 lb 6 oz (77.7 kg)  03/21/17 174 lb (78.9 kg)    Physical Exam  Constitutional: He is oriented to person, place, and time. He appears well-developed and well-nourished. No distress.  Eyes: Conjunctivae are normal. No scleral icterus.  Neck: Neck supple. No thyromegaly present.  Cardiovascular: Normal rate, regular rhythm, normal heart sounds and intact distal  pulses.  No murmur heard. Pulmonary/Chest: Effort normal and breath sounds normal. No respiratory distress. He has no wheezes.  Musculoskeletal: Normal range of motion. He exhibits no edema.  Lymphadenopathy:    He has no cervical adenopathy.  Neurological: He is alert and oriented to person, place, and time. Coordination normal.  Skin: Skin is warm and dry. No rash noted. He is not diaphoretic.  Psychiatric: He has a normal mood and affect. His behavior is normal.  Nursing note and vitals reviewed.     Assessment & Plan:   Problem List Items Addressed This Visit      Digestive   GERD (gastroesophageal reflux disease)   Relevant Orders   CBC with Differential/Platelet     Genitourinary   CKD (chronic kidney disease)  stage 3, GFR 30-59 ml/min (HCC) (Chronic)   Relevant Orders   Renal Function Panel     Other   Hyperlipidemia - Primary   Relevant Medications   simvastatin (ZOCOR) 40 MG tablet   Other Relevant Orders   Lipid panel    Other Visit Diagnoses    Down syndrome       Relevant Orders   TSH      Follow up plan: Return in about 6 months (around 05/17/2018), or if symptoms worsen or fail to improve, for Recheck cholesterol and kidney and GERD.  Counseling provided for all of the vaccine components Orders Placed This Encounter  Procedures  . CBC with Differential/Platelet  . Lipid panel  . Renal Function Panel  . TSH    Caryl Pina, MD [facility] 11/16/2017, 12:18 PM

## 2017-11-22 ENCOUNTER — Other Ambulatory Visit: Payer: Self-pay | Admitting: Family Medicine

## 2017-11-22 DIAGNOSIS — Q909 Down syndrome, unspecified: Secondary | ICD-10-CM | POA: Diagnosis not present

## 2017-11-22 DIAGNOSIS — E559 Vitamin D deficiency, unspecified: Secondary | ICD-10-CM | POA: Diagnosis not present

## 2017-11-22 DIAGNOSIS — Z79899 Other long term (current) drug therapy: Secondary | ICD-10-CM | POA: Diagnosis not present

## 2017-11-22 DIAGNOSIS — K219 Gastro-esophageal reflux disease without esophagitis: Secondary | ICD-10-CM | POA: Diagnosis not present

## 2017-11-22 DIAGNOSIS — E782 Mixed hyperlipidemia: Secondary | ICD-10-CM | POA: Diagnosis not present

## 2017-11-22 DIAGNOSIS — I10 Essential (primary) hypertension: Secondary | ICD-10-CM | POA: Diagnosis not present

## 2017-11-22 DIAGNOSIS — N183 Chronic kidney disease, stage 3 (moderate): Secondary | ICD-10-CM | POA: Diagnosis not present

## 2017-11-22 DIAGNOSIS — D509 Iron deficiency anemia, unspecified: Secondary | ICD-10-CM | POA: Diagnosis not present

## 2017-11-22 DIAGNOSIS — R809 Proteinuria, unspecified: Secondary | ICD-10-CM | POA: Diagnosis not present

## 2017-11-23 LAB — CBC WITH DIFFERENTIAL/PLATELET
BASOS: 1 %
Basophils Absolute: 0.1 10*3/uL (ref 0.0–0.2)
EOS (ABSOLUTE): 0.1 10*3/uL (ref 0.0–0.4)
EOS: 1 %
HEMATOCRIT: 41 % (ref 37.5–51.0)
HEMOGLOBIN: 14.4 g/dL (ref 13.0–17.7)
IMMATURE GRANS (ABS): 0 10*3/uL (ref 0.0–0.1)
IMMATURE GRANULOCYTES: 0 %
Lymphocytes Absolute: 0.4 10*3/uL — ABNORMAL LOW (ref 0.7–3.1)
Lymphs: 5 %
MCH: 34.5 pg — ABNORMAL HIGH (ref 26.6–33.0)
MCHC: 35.1 g/dL (ref 31.5–35.7)
MCV: 98 fL — ABNORMAL HIGH (ref 79–97)
MONOCYTES: 7 %
MONOS ABS: 0.6 10*3/uL (ref 0.1–0.9)
NEUTROS PCT: 86 %
Neutrophils Absolute: 7.5 10*3/uL — ABNORMAL HIGH (ref 1.4–7.0)
Platelets: 214 10*3/uL (ref 150–450)
RBC: 4.17 x10E6/uL (ref 4.14–5.80)
RDW: 13.7 % (ref 12.3–15.4)
WBC: 8.7 10*3/uL (ref 3.4–10.8)

## 2017-11-23 LAB — LIPID PANEL
CHOL/HDL RATIO: 2.3 ratio (ref 0.0–5.0)
CHOLESTEROL TOTAL: 150 mg/dL (ref 100–199)
HDL: 64 mg/dL (ref >39)
LDL CALC: 69 mg/dL (ref 0–99)
TRIGLYCERIDES: 83 mg/dL (ref 0–149)
VLDL Cholesterol Cal: 17 mg/dL (ref 5–40)

## 2017-11-23 LAB — TSH: TSH: 1.23 u[IU]/mL (ref 0.450–4.500)

## 2017-11-28 DIAGNOSIS — N1330 Unspecified hydronephrosis: Secondary | ICD-10-CM | POA: Diagnosis not present

## 2017-11-28 DIAGNOSIS — R809 Proteinuria, unspecified: Secondary | ICD-10-CM | POA: Diagnosis not present

## 2017-11-28 DIAGNOSIS — E875 Hyperkalemia: Secondary | ICD-10-CM | POA: Diagnosis not present

## 2017-11-28 DIAGNOSIS — E559 Vitamin D deficiency, unspecified: Secondary | ICD-10-CM | POA: Diagnosis not present

## 2017-11-28 DIAGNOSIS — D509 Iron deficiency anemia, unspecified: Secondary | ICD-10-CM | POA: Diagnosis not present

## 2017-11-28 DIAGNOSIS — N183 Chronic kidney disease, stage 3 (moderate): Secondary | ICD-10-CM | POA: Diagnosis not present

## 2017-11-28 DIAGNOSIS — E870 Hyperosmolality and hypernatremia: Secondary | ICD-10-CM | POA: Diagnosis not present

## 2017-11-30 DIAGNOSIS — R339 Retention of urine, unspecified: Secondary | ICD-10-CM | POA: Diagnosis not present

## 2017-12-27 DIAGNOSIS — R3914 Feeling of incomplete bladder emptying: Secondary | ICD-10-CM | POA: Diagnosis not present

## 2017-12-27 DIAGNOSIS — R8279 Other abnormal findings on microbiological examination of urine: Secondary | ICD-10-CM | POA: Diagnosis not present

## 2017-12-27 DIAGNOSIS — N3281 Overactive bladder: Secondary | ICD-10-CM | POA: Diagnosis not present

## 2017-12-27 DIAGNOSIS — N1330 Unspecified hydronephrosis: Secondary | ICD-10-CM | POA: Diagnosis not present

## 2018-01-09 DIAGNOSIS — B351 Tinea unguium: Secondary | ICD-10-CM | POA: Diagnosis not present

## 2018-01-09 DIAGNOSIS — M79676 Pain in unspecified toe(s): Secondary | ICD-10-CM | POA: Diagnosis not present

## 2018-01-24 DIAGNOSIS — N401 Enlarged prostate with lower urinary tract symptoms: Secondary | ICD-10-CM | POA: Diagnosis not present

## 2018-01-24 DIAGNOSIS — Z435 Encounter for attention to cystostomy: Secondary | ICD-10-CM | POA: Diagnosis not present

## 2018-01-24 DIAGNOSIS — R3914 Feeling of incomplete bladder emptying: Secondary | ICD-10-CM | POA: Diagnosis not present

## 2018-01-25 DIAGNOSIS — H04123 Dry eye syndrome of bilateral lacrimal glands: Secondary | ICD-10-CM | POA: Diagnosis not present

## 2018-02-21 DIAGNOSIS — N3281 Overactive bladder: Secondary | ICD-10-CM | POA: Diagnosis not present

## 2018-02-21 DIAGNOSIS — R3914 Feeling of incomplete bladder emptying: Secondary | ICD-10-CM | POA: Diagnosis not present

## 2018-03-20 DIAGNOSIS — M79676 Pain in unspecified toe(s): Secondary | ICD-10-CM | POA: Diagnosis not present

## 2018-03-20 DIAGNOSIS — B351 Tinea unguium: Secondary | ICD-10-CM | POA: Diagnosis not present

## 2018-03-22 DIAGNOSIS — R3914 Feeling of incomplete bladder emptying: Secondary | ICD-10-CM | POA: Diagnosis not present

## 2018-04-11 ENCOUNTER — Other Ambulatory Visit: Payer: Medicare HMO

## 2018-04-11 DIAGNOSIS — D509 Iron deficiency anemia, unspecified: Secondary | ICD-10-CM | POA: Diagnosis not present

## 2018-04-11 DIAGNOSIS — I10 Essential (primary) hypertension: Secondary | ICD-10-CM | POA: Diagnosis not present

## 2018-04-11 DIAGNOSIS — R809 Proteinuria, unspecified: Secondary | ICD-10-CM | POA: Diagnosis not present

## 2018-04-11 DIAGNOSIS — E559 Vitamin D deficiency, unspecified: Secondary | ICD-10-CM | POA: Diagnosis not present

## 2018-04-11 DIAGNOSIS — Z79899 Other long term (current) drug therapy: Secondary | ICD-10-CM | POA: Diagnosis not present

## 2018-04-11 DIAGNOSIS — N183 Chronic kidney disease, stage 3 (moderate): Secondary | ICD-10-CM | POA: Diagnosis not present

## 2018-04-17 DIAGNOSIS — N183 Chronic kidney disease, stage 3 (moderate): Secondary | ICD-10-CM | POA: Diagnosis not present

## 2018-04-17 DIAGNOSIS — I10 Essential (primary) hypertension: Secondary | ICD-10-CM | POA: Diagnosis not present

## 2018-04-17 DIAGNOSIS — E559 Vitamin D deficiency, unspecified: Secondary | ICD-10-CM | POA: Diagnosis not present

## 2018-04-17 DIAGNOSIS — R809 Proteinuria, unspecified: Secondary | ICD-10-CM | POA: Diagnosis not present

## 2018-04-24 ENCOUNTER — Other Ambulatory Visit: Payer: Self-pay | Admitting: Family Medicine

## 2018-04-24 DIAGNOSIS — K219 Gastro-esophageal reflux disease without esophagitis: Secondary | ICD-10-CM

## 2018-05-01 DIAGNOSIS — R3914 Feeling of incomplete bladder emptying: Secondary | ICD-10-CM | POA: Diagnosis not present

## 2018-05-17 ENCOUNTER — Other Ambulatory Visit: Payer: Self-pay

## 2018-05-17 ENCOUNTER — Encounter: Payer: Self-pay | Admitting: Family Medicine

## 2018-05-17 ENCOUNTER — Ambulatory Visit (INDEPENDENT_AMBULATORY_CARE_PROVIDER_SITE_OTHER): Payer: Medicare HMO | Admitting: Family Medicine

## 2018-05-17 VITALS — BP 98/68 | HR 87 | Temp 97.9°F | Ht 60.5 in | Wt 169.8 lb

## 2018-05-17 DIAGNOSIS — K219 Gastro-esophageal reflux disease without esophagitis: Secondary | ICD-10-CM | POA: Diagnosis not present

## 2018-05-17 DIAGNOSIS — Z86718 Personal history of other venous thrombosis and embolism: Secondary | ICD-10-CM | POA: Insufficient documentation

## 2018-05-17 DIAGNOSIS — R413 Other amnesia: Secondary | ICD-10-CM

## 2018-05-17 DIAGNOSIS — E782 Mixed hyperlipidemia: Secondary | ICD-10-CM | POA: Diagnosis not present

## 2018-05-17 DIAGNOSIS — Q909 Down syndrome, unspecified: Secondary | ICD-10-CM | POA: Diagnosis not present

## 2018-05-17 DIAGNOSIS — N183 Chronic kidney disease, stage 3 unspecified: Secondary | ICD-10-CM

## 2018-05-17 DIAGNOSIS — Z86711 Personal history of pulmonary embolism: Secondary | ICD-10-CM | POA: Insufficient documentation

## 2018-05-17 MED ORDER — PANTOPRAZOLE SODIUM 40 MG PO TBEC
DELAYED_RELEASE_TABLET | ORAL | 3 refills | Status: DC
Start: 2018-05-17 — End: 2019-08-12

## 2018-05-17 NOTE — Progress Notes (Signed)
BP 98/68   Pulse 87   Temp 97.9 F (36.6 C) (Oral)   Ht 5' 0.5" (1.537 m)   Wt 169 lb 12.8 oz (77 kg)   BMI 32.62 kg/m    Subjective:    Patient ID: Frank Levine, male    DOB: May 11, 1960, 58 y.o.   MRN: [id number]  HPI: Frank Levine is a 58 y.o. male presenting on 05/17/2018 for Hyperlipidemia (5 month follow up)   HPI Hyperlipidemia Patient is coming in for recheck of his hyperlipidemia. The patient is currently taking simvastatin. They deny any issues with myalgias or history of liver damage from it. They deny any focal numbness or weakness or chest pain.   GERD Patient is currently on Protonix.  She denies any major symptoms or abdominal pain or belching or burping. She denies any blood in her stool or lightheadedness or dizziness.   Patient has CKD and does see a nephrologist for this we will recheck labs today.  He denies any urinary issues.  Relevant past medical, surgical, family and social history reviewed and updated as indicated. Interim medical history since our last visit reviewed. Allergies and medications reviewed and updated.  Review of Systems  Constitutional: Negative for chills and fever.  Respiratory: Negative for shortness of breath and wheezing.   Cardiovascular: Negative for chest pain and leg swelling.  Musculoskeletal: Negative for back pain and gait problem.  Skin: Negative for rash.  Neurological: Negative for dizziness, weakness and light-headedness.  All other systems reviewed and are negative.   Per HPI unless specifically indicated above   Allergies as of 05/17/2018   No Known Allergies     Medication List       Accurate as of May 17, 2018 11:21 AM. Always use your most recent med list.        aspirin EC 81 MG tablet Take 81 mg by mouth daily.   docusate sodium 100 MG capsule Commonly known as:  COLACE Take 100 mg by mouth at bedtime.   pantoprazole 40 MG tablet Commonly known as:  PROTONIX TAKE 1 TABLET BY MOUTH EVERY DAY  IN THE MORNING   simvastatin 40 MG tablet Commonly known as:  ZOCOR TAKE 1 TABLET BY MOUTH ONCE DAILY AT 6PM   Vitamin D (Ergocalciferol) 1.25 MG (50000 UT) Caps capsule Commonly known as:  DRISDOL Take 50,000 Units by mouth every 7 (seven) days.          Objective:    BP 98/68   Pulse 87   Temp 97.9 F (36.6 C) (Oral)   Ht 5' 0.5" (1.537 m)   Wt 169 lb 12.8 oz (77 kg)   BMI 32.62 kg/m   Wt Readings from Last 3 Encounters:  05/17/18 169 lb 12.8 oz (77 kg)  11/16/17 168 lb 9.6 oz (76.5 kg)  05/04/17 171 lb 6 oz (77.7 kg)    Physical Exam Vitals signs and nursing note reviewed.  Constitutional:      General: He is not in acute distress.    Appearance: He is well-developed. He is not diaphoretic.  Eyes:     General: No scleral icterus.    Conjunctiva/sclera: Conjunctivae normal.  Neck:     Musculoskeletal: Neck supple.     Thyroid: No thyromegaly.  Cardiovascular:     Rate and Rhythm: Normal rate and regular rhythm.     Heart sounds: Normal heart sounds. No murmur.  Pulmonary:     Effort: Pulmonary effort is normal. No respiratory  distress.     Breath sounds: Normal breath sounds. No wheezing.  Musculoskeletal: Normal range of motion.  Lymphadenopathy:     Cervical: No cervical adenopathy.  Skin:    General: Skin is warm and dry.     Findings: No rash.  Neurological:     Mental Status: He is alert and oriented to person, place, and time.     Coordination: Coordination normal.  Psychiatric:        Behavior: Behavior normal.         Assessment & Plan:   Problem List Items Addressed This Visit      Digestive   GERD (gastroesophageal reflux disease)   Relevant Medications   pantoprazole (PROTONIX) 40 MG tablet   Other Relevant Orders   CBC with Differential/Platelet     Genitourinary   CKD (chronic kidney disease) stage 3, GFR 30-59 ml/min (HCC) - Primary (Chronic)   Relevant Orders   CBC with Differential/Platelet   CMP14+EGFR     Other    Down's syndrome   Hyperlipidemia   Relevant Orders   Lipid panel    Other Visit Diagnoses    Memory difficulties       Will schedule for annual wellness visit to get Alzheimer and dementia and MMSE testing      Patient will schedule Medicare annual wellness visit to get testing for possible dementia, continue Protonix Follow up plan: Return in about 6 months (around 11/17/2018), or if symptoms worsen or fail to improve, for GERD and cholesterol recheck, needs awv.  Counseling provided for all of the vaccine components Orders Placed This Encounter  Procedures  . CBC with Differential/Platelet  . CMP14+EGFR  . Lipid panel    Caryl Pina, MD [facility] 05/17/2018, 11:21 AM

## 2018-05-18 LAB — CBC WITH DIFFERENTIAL/PLATELET
BASOS ABS: 0.1 10*3/uL (ref 0.0–0.2)
Basos: 1 %
EOS (ABSOLUTE): 0.2 10*3/uL (ref 0.0–0.4)
EOS: 3 %
HEMATOCRIT: 42.4 % (ref 37.5–51.0)
HEMOGLOBIN: 14.7 g/dL (ref 13.0–17.7)
IMMATURE GRANULOCYTES: 1 %
Immature Grans (Abs): 0 10*3/uL (ref 0.0–0.1)
Lymphocytes Absolute: 1 10*3/uL (ref 0.7–3.1)
Lymphs: 17 %
MCH: 34.2 pg — ABNORMAL HIGH (ref 26.6–33.0)
MCHC: 34.7 g/dL (ref 31.5–35.7)
MCV: 99 fL — ABNORMAL HIGH (ref 79–97)
MONOCYTES: 8 %
Monocytes Absolute: 0.5 10*3/uL (ref 0.1–0.9)
NEUTROS PCT: 70 %
Neutrophils Absolute: 4.3 10*3/uL (ref 1.4–7.0)
Platelets: 289 10*3/uL (ref 150–450)
RBC: 4.3 x10E6/uL (ref 4.14–5.80)
RDW: 13.5 % (ref 11.6–15.4)
WBC: 6.1 10*3/uL (ref 3.4–10.8)

## 2018-05-18 LAB — CMP14+EGFR
ALK PHOS: 89 IU/L (ref 39–117)
ALT: 15 IU/L (ref 0–44)
AST: 21 IU/L (ref 0–40)
Albumin/Globulin Ratio: 1.2 (ref 1.2–2.2)
Albumin: 3.8 g/dL (ref 3.8–4.9)
BILIRUBIN TOTAL: 0.4 mg/dL (ref 0.0–1.2)
BUN/Creatinine Ratio: 7 — ABNORMAL LOW (ref 9–20)
BUN: 15 mg/dL (ref 6–24)
CHLORIDE: 105 mmol/L (ref 96–106)
CO2: 26 mmol/L (ref 20–29)
CREATININE: 2.04 mg/dL — AB (ref 0.76–1.27)
Calcium: 9 mg/dL (ref 8.7–10.2)
GFR calc Af Amer: 40 mL/min/{1.73_m2} — ABNORMAL LOW (ref >59)
GFR calc non Af Amer: 35 mL/min/{1.73_m2} — ABNORMAL LOW (ref >59)
GLUCOSE: 90 mg/dL (ref 65–99)
Globulin, Total: 3.2 g/dL (ref 1.5–4.5)
Potassium: 5.6 mmol/L — ABNORMAL HIGH (ref 3.5–5.2)
Sodium: 144 mmol/L (ref 134–144)
Total Protein: 7 g/dL (ref 6.0–8.5)

## 2018-05-18 LAB — LIPID PANEL
CHOLESTEROL TOTAL: 154 mg/dL (ref 100–199)
Chol/HDL Ratio: 2.7 ratio (ref 0.0–5.0)
HDL: 57 mg/dL (ref >39)
LDL CALC: 82 mg/dL (ref 0–99)
TRIGLYCERIDES: 74 mg/dL (ref 0–149)
VLDL CHOLESTEROL CAL: 15 mg/dL (ref 5–40)

## 2018-06-07 DIAGNOSIS — Z435 Encounter for attention to cystostomy: Secondary | ICD-10-CM | POA: Diagnosis not present

## 2018-06-07 DIAGNOSIS — N130 Hydronephrosis with ureteropelvic junction obstruction: Secondary | ICD-10-CM | POA: Diagnosis not present

## 2018-06-07 DIAGNOSIS — R3914 Feeling of incomplete bladder emptying: Secondary | ICD-10-CM | POA: Diagnosis not present

## 2018-07-10 DIAGNOSIS — R3914 Feeling of incomplete bladder emptying: Secondary | ICD-10-CM | POA: Diagnosis not present

## 2018-07-18 ENCOUNTER — Ambulatory Visit (INDEPENDENT_AMBULATORY_CARE_PROVIDER_SITE_OTHER): Payer: 59

## 2018-07-18 ENCOUNTER — Ambulatory Visit (INDEPENDENT_AMBULATORY_CARE_PROVIDER_SITE_OTHER): Payer: 59 | Admitting: Family Medicine

## 2018-07-18 ENCOUNTER — Other Ambulatory Visit: Payer: Self-pay

## 2018-07-18 ENCOUNTER — Encounter: Payer: Self-pay | Admitting: Family Medicine

## 2018-07-18 VITALS — BP 104/65 | HR 99 | Temp 97.7°F | Ht 60.5 in | Wt 167.0 lb

## 2018-07-18 DIAGNOSIS — M25572 Pain in left ankle and joints of left foot: Secondary | ICD-10-CM

## 2018-07-18 DIAGNOSIS — S99912A Unspecified injury of left ankle, initial encounter: Secondary | ICD-10-CM | POA: Diagnosis not present

## 2018-07-18 MED ORDER — DICLOFENAC SODIUM 75 MG PO TBEC: 75.0000 mg | DELAYED_RELEASE_TABLET | Freq: Two times a day (BID) | ORAL | 0 refills | Status: DC

## 2018-07-18 NOTE — Progress Notes (Signed)
Chief Complaint  Patient presents with  . left ankle pain    fall this morning - DUE to the pain    HPI  Patient presents today for pain at the lateral aspect of the left ankle.  Onset yesterday evening.  No known injury.  However, this morning because of the pain he stepped off of step twisting turning his ankle and has had increased pain and swelling ever since.  PMH: Smoking status noted ROS: Per HPI  Objective: BP 104/65 (BP Location: Left Arm)   Pulse 99   Temp 97.7 F (36.5 C) (Oral)   Ht 5' 0.5" (1.537 m)   Wt 167 lb (75.8 kg)   BMI 32.08 kg/m  Gen: NAD, alert, cooperative with exam HEENT: NCAT, EOMI, PERRL CV: RRR, good S1/S2, no murmur Resp: CTABL, no wheezes, non-labored Ext: Mild edema to the left midfoot dorsally.  Full range of motion at the ankle somewhat tender.  It is stable for collateral stress maneuvers/inversion and eversion.  The left lower extremity is neurovascularly intact. Neuro: Alert and oriented, No gross deficits  Assessment and plan:  1. Acute left ankle pain   2. Left ankle pain, unspecified chronicity    ASO brace dispensed.  Patient to wear that when he is on his feet at all times for the next 2 weeks. Meds ordered this encounter  Medications  . diclofenac (VOLTAREN) 75 MG EC tablet    Sig: Take 1 tablet (75 mg total) by mouth 2 (two) times daily. For muscle and  Joint pain    Dispense:  30 tablet    Refill:  0    Orders Placed This Encounter  Procedures  . DG Ankle Complete Left    Standing Status:   Future    Number of Occurrences:   1    Standing Expiration Date:   09/17/2019    Order Specific Question:   Reason for Exam (SYMPTOM  OR DIAGNOSIS REQUIRED)    Answer:   left ankle pain    Order Specific Question:   Preferred imaging location?    Answer:   Internal    Follow up as needed.  Claretta Fraise, MD

## 2018-08-07 DIAGNOSIS — N3000 Acute cystitis without hematuria: Secondary | ICD-10-CM | POA: Diagnosis not present

## 2018-08-23 ENCOUNTER — Other Ambulatory Visit: Payer: Medicare HMO

## 2018-08-23 ENCOUNTER — Other Ambulatory Visit: Payer: Self-pay

## 2018-08-23 DIAGNOSIS — R809 Proteinuria, unspecified: Secondary | ICD-10-CM | POA: Diagnosis not present

## 2018-08-23 DIAGNOSIS — N183 Chronic kidney disease, stage 3 (moderate): Secondary | ICD-10-CM | POA: Diagnosis not present

## 2018-08-23 DIAGNOSIS — D509 Iron deficiency anemia, unspecified: Secondary | ICD-10-CM | POA: Diagnosis not present

## 2018-08-23 DIAGNOSIS — E559 Vitamin D deficiency, unspecified: Secondary | ICD-10-CM | POA: Diagnosis not present

## 2018-08-23 DIAGNOSIS — Z79899 Other long term (current) drug therapy: Secondary | ICD-10-CM | POA: Diagnosis not present

## 2018-08-23 DIAGNOSIS — I10 Essential (primary) hypertension: Secondary | ICD-10-CM | POA: Diagnosis not present

## 2018-08-30 DIAGNOSIS — R809 Proteinuria, unspecified: Secondary | ICD-10-CM | POA: Diagnosis not present

## 2018-08-30 DIAGNOSIS — E8889 Other specified metabolic disorders: Secondary | ICD-10-CM | POA: Diagnosis not present

## 2018-08-30 DIAGNOSIS — N183 Chronic kidney disease, stage 3 (moderate): Secondary | ICD-10-CM | POA: Diagnosis not present

## 2018-08-30 DIAGNOSIS — N400 Enlarged prostate without lower urinary tract symptoms: Secondary | ICD-10-CM | POA: Diagnosis not present

## 2018-08-30 DIAGNOSIS — M9080 Osteopathy in diseases classified elsewhere, unspecified site: Secondary | ICD-10-CM | POA: Diagnosis not present

## 2018-09-06 DIAGNOSIS — M79676 Pain in unspecified toe(s): Secondary | ICD-10-CM | POA: Diagnosis not present

## 2018-09-06 DIAGNOSIS — B351 Tinea unguium: Secondary | ICD-10-CM | POA: Diagnosis not present

## 2018-09-11 DIAGNOSIS — N3000 Acute cystitis without hematuria: Secondary | ICD-10-CM | POA: Diagnosis not present

## 2018-10-09 DIAGNOSIS — R3914 Feeling of incomplete bladder emptying: Secondary | ICD-10-CM | POA: Diagnosis not present

## 2018-11-01 DIAGNOSIS — R3914 Feeling of incomplete bladder emptying: Secondary | ICD-10-CM | POA: Diagnosis not present

## 2018-11-01 DIAGNOSIS — N130 Hydronephrosis with ureteropelvic junction obstruction: Secondary | ICD-10-CM | POA: Diagnosis not present

## 2018-11-15 DIAGNOSIS — M79676 Pain in unspecified toe(s): Secondary | ICD-10-CM | POA: Diagnosis not present

## 2018-11-15 DIAGNOSIS — B351 Tinea unguium: Secondary | ICD-10-CM | POA: Diagnosis not present

## 2018-11-19 ENCOUNTER — Other Ambulatory Visit: Payer: Self-pay

## 2018-11-20 ENCOUNTER — Ambulatory Visit (INDEPENDENT_AMBULATORY_CARE_PROVIDER_SITE_OTHER): Payer: Medicare HMO | Admitting: Family Medicine

## 2018-11-20 ENCOUNTER — Encounter: Payer: Self-pay | Admitting: Family Medicine

## 2018-11-20 VITALS — BP 102/70 | HR 85 | Temp 98.9°F | Resp 18 | Ht 60.5 in | Wt 168.4 lb

## 2018-11-20 DIAGNOSIS — Q909 Down syndrome, unspecified: Secondary | ICD-10-CM

## 2018-11-20 DIAGNOSIS — E782 Mixed hyperlipidemia: Secondary | ICD-10-CM

## 2018-11-20 DIAGNOSIS — Z23 Encounter for immunization: Secondary | ICD-10-CM

## 2018-11-20 DIAGNOSIS — N183 Chronic kidney disease, stage 3 unspecified: Secondary | ICD-10-CM

## 2018-11-20 DIAGNOSIS — F0390 Unspecified dementia without behavioral disturbance: Secondary | ICD-10-CM

## 2018-11-20 MED ORDER — SIMVASTATIN 40 MG PO TABS: ORAL_TABLET | ORAL | 3 refills | Status: DC

## 2018-11-20 NOTE — Progress Notes (Signed)
BP 102/70   Pulse 85   Temp 98.9 F (37.2 C) (Temporal)   Resp 18   Ht 5' 0.5" (1.537 m)   Wt 168 lb 6.4 oz (76.4 kg)   SpO2 99%   BMI 32.35 kg/m    Subjective:   Patient ID: Frank Levine, male    DOB: [birth year], 58 y.o.   MRN: [id number]  HPI: Frank Levine is a 58 y.o. male presenting on 11/20/2018 for Hyperlipidemia (6 month follow up)   HPI Hyperlipidemia Patient is coming in for recheck of his hyperlipidemia. The patient is currently taking simvastatin. They deny any issues with myalgias or history of liver damage from it. They deny any focal numbness or weakness or chest pain.   ckd 3 Patient is coming in for recheck with labs, he does have a nephrologist for his chronic kidney disease and is coming in for the recheck today.  Patient's kidneys have been stable and will do some blood work to recheck it.  He denies any urinary issues currently.  He does currently have a suprapubic catheter gets changed every month  Relevant past medical, surgical, family and social history reviewed and updated as indicated. Interim medical history since our last visit reviewed. Allergies and medications reviewed and updated.  Review of Systems  Constitutional: Negative for chills and fever.  Eyes: Negative for visual disturbance.  Respiratory: Negative for shortness of breath and wheezing.   Cardiovascular: Negative for chest pain and leg swelling.  Musculoskeletal: Negative for back pain and gait problem.  Skin: Negative for rash.  Neurological: Negative for dizziness, weakness and light-headedness.  All other systems reviewed and are negative.   Per HPI unless specifically indicated above   Allergies as of 11/20/2018   No Known Allergies     Medication List       Accurate as of November 20, 2018 11:59 PM. If you have any questions, ask your nurse or doctor.        STOP taking these medications   Vitamin D (Ergocalciferol) 1.25 MG (50000 UT) Caps capsule Commonly known  as: DRISDOL Stopped by: Fransisca Kaufmann , MD     TAKE these medications   aspirin EC 81 MG tablet Take 81 mg by mouth daily.   diclofenac 75 MG EC tablet Commonly known as: VOLTAREN Take 1 tablet (75 mg total) by mouth 2 (two) times daily. For muscle and  Joint pain   docusate sodium 100 MG capsule Commonly known as: COLACE Take 100 mg by mouth at bedtime.   ketoconazole 2 % cream Commonly known as: NIZORAL   pantoprazole 40 MG tablet Commonly known as: PROTONIX TAKE 1 TABLET BY MOUTH EVERY DAY IN THE MORNING   simvastatin 40 MG tablet Commonly known as: ZOCOR TAKE 1 TABLET BY MOUTH ONCE DAILY AT 6PM   Vitamin D 50 MCG (2000 UT) tablet Take 1 tablet by mouth daily.        Objective:   BP 102/70   Pulse 85   Temp 98.9 F (37.2 C) (Temporal)   Resp 18   Ht 5' 0.5" (1.537 m)   Wt 168 lb 6.4 oz (76.4 kg)   SpO2 99%   BMI 32.35 kg/m   Wt Readings from Last 3 Encounters:  11/20/18 168 lb 6.4 oz (76.4 kg)  07/18/18 167 lb (75.8 kg)  05/17/18 169 lb 12.8 oz (77 kg)    Physical Exam Vitals signs and nursing note reviewed.  Constitutional:  General: He is not in acute distress.    Appearance: He is well-developed. He is not diaphoretic.  Eyes:     General: No scleral icterus.    Conjunctiva/sclera: Conjunctivae normal.  Neck:     Musculoskeletal: Neck supple.     Thyroid: No thyromegaly.  Cardiovascular:     Rate and Rhythm: Normal rate and regular rhythm.     Heart sounds: Normal heart sounds. No murmur.  Pulmonary:     Effort: Pulmonary effort is normal. No respiratory distress.     Breath sounds: Normal breath sounds. No wheezing.  Musculoskeletal: Normal range of motion.  Lymphadenopathy:     Cervical: No cervical adenopathy.  Skin:    General: Skin is warm and dry.     Findings: No rash.  Neurological:     Mental Status: He is alert and oriented to person, place, and time.     Coordination: Coordination normal.  Psychiatric:         Behavior: Behavior normal.       Assessment & Plan:   Problem List Items Addressed This Visit      Genitourinary   CKD (chronic kidney disease) stage 3, GFR 30-59 ml/min (HCC) - Primary (Chronic)   Relevant Orders   CBC with Differential/Platelet (Completed)   CMP14+EGFR (Completed)     Other   Down's syndrome   Hyperlipidemia   Relevant Medications   simvastatin (ZOCOR) 40 MG tablet   Other Relevant Orders   Lipid panel (Completed)    Other Visit Diagnoses    Need for immunization against influenza       Relevant Orders   Flu Vaccine QUAD 36+ mos IM (Completed)   Dementia without behavioral disturbance, unspecified dementia type ([place])          Continue simvastatin, no other medications Follow up plan: Return in about 6 months (around 05/20/2019), or if symptoms worsen or fail to improve, for Hyperlipidemia.  Counseling provided for all of the vaccine components Orders Placed This Encounter  Procedures  . Flu Vaccine QUAD 36+ mos IM  . CBC with Differential/Platelet  . CMP14+EGFR  . Lipid panel    Caryl Pina, MD [facility] 11/21/2018, 9:17 PM

## 2018-11-21 LAB — CBC WITH DIFFERENTIAL/PLATELET
Basophils Absolute: 0.1 x10E3/uL (ref 0.0–0.2)
Basos: 1 %
EOS (ABSOLUTE): 0.1 x10E3/uL (ref 0.0–0.4)
Eos: 3 %
Hematocrit: 47 % (ref 37.5–51.0)
Hemoglobin: 15.3 g/dL (ref 13.0–17.7)
Immature Grans (Abs): 0 x10E3/uL (ref 0.0–0.1)
Immature Granulocytes: 0 %
Lymphocytes Absolute: 1 x10E3/uL (ref 0.7–3.1)
Lymphs: 20 %
MCH: 33.4 pg — ABNORMAL HIGH (ref 26.6–33.0)
MCHC: 32.6 g/dL (ref 31.5–35.7)
MCV: 103 fL — ABNORMAL HIGH (ref 79–97)
Monocytes Absolute: 0.4 x10E3/uL (ref 0.1–0.9)
Monocytes: 9 %
Neutrophils Absolute: 3.5 x10E3/uL (ref 1.4–7.0)
Neutrophils: 67 %
Platelets: 263 x10E3/uL (ref 150–450)
RBC: 4.58 x10E6/uL (ref 4.14–5.80)
RDW: 13.8 % (ref 11.6–15.4)
WBC: 5.1 x10E3/uL (ref 3.4–10.8)

## 2018-11-21 LAB — CMP14+EGFR
ALT: 11 IU/L (ref 0–44)
AST: 18 IU/L (ref 0–40)
Albumin/Globulin Ratio: 1.3 (ref 1.2–2.2)
Albumin: 3.9 g/dL (ref 3.8–4.9)
Alkaline Phosphatase: 72 IU/L (ref 39–117)
BUN/Creatinine Ratio: 12 (ref 9–20)
BUN: 20 mg/dL (ref 6–24)
Bilirubin Total: 0.6 mg/dL (ref 0.0–1.2)
CO2: 24 mmol/L (ref 20–29)
Calcium: 9.2 mg/dL (ref 8.7–10.2)
Chloride: 103 mmol/L (ref 96–106)
Creatinine, Ser: 1.62 mg/dL — ABNORMAL HIGH (ref 0.76–1.27)
GFR calc Af Amer: 53 mL/min/{1.73_m2} — ABNORMAL LOW (ref >59)
GFR calc non Af Amer: 46 mL/min/{1.73_m2} — ABNORMAL LOW (ref >59)
Globulin, Total: 3.1 g/dL (ref 1.5–4.5)
Glucose: 89 mg/dL (ref 65–99)
Potassium: 5.4 mmol/L — ABNORMAL HIGH (ref 3.5–5.2)
Sodium: 141 mmol/L (ref 134–144)
Total Protein: 7 g/dL (ref 6.0–8.5)

## 2018-11-21 LAB — LIPID PANEL
Chol/HDL Ratio: 2.8 ratio (ref 0.0–5.0)
Cholesterol, Total: 164 mg/dL (ref 100–199)
HDL: 58 mg/dL (ref >39)
LDL Chol Calc (NIH): 93 mg/dL (ref 0–99)
Triglycerides: 64 mg/dL (ref 0–149)
VLDL Cholesterol Cal: 13 mg/dL (ref 5–40)

## 2018-11-23 ENCOUNTER — Telehealth: Payer: Self-pay | Admitting: Family Medicine

## 2018-11-23 NOTE — Telephone Encounter (Signed)
Mother has already been called and is aware home health referral was sent to [email or web address] and is pending weith them.

## 2018-11-23 NOTE — Telephone Encounter (Signed)
Juliann Pulse was looking into this and I thought she said it had gotten resolved, the home health was originally ordered by the urology.

## 2018-11-26 NOTE — Telephone Encounter (Signed)
TC to mom she is aware that I contacted [facility], they are planning to start care on Saturday 12/01/18

## 2018-12-01 DIAGNOSIS — Z466 Encounter for fitting and adjustment of urinary device: Secondary | ICD-10-CM | POA: Diagnosis not present

## 2018-12-05 DIAGNOSIS — Z466 Encounter for fitting and adjustment of urinary device: Secondary | ICD-10-CM | POA: Diagnosis not present

## 2019-01-09 DIAGNOSIS — Z466 Encounter for fitting and adjustment of urinary device: Secondary | ICD-10-CM | POA: Diagnosis not present

## 2019-01-24 DIAGNOSIS — B351 Tinea unguium: Secondary | ICD-10-CM | POA: Diagnosis not present

## 2019-01-24 DIAGNOSIS — M79676 Pain in unspecified toe(s): Secondary | ICD-10-CM | POA: Diagnosis not present

## 2019-01-25 DIAGNOSIS — Z466 Encounter for fitting and adjustment of urinary device: Secondary | ICD-10-CM | POA: Diagnosis not present

## 2019-02-22 DIAGNOSIS — N401 Enlarged prostate with lower urinary tract symptoms: Secondary | ICD-10-CM | POA: Diagnosis not present

## 2019-02-22 DIAGNOSIS — R338 Other retention of urine: Secondary | ICD-10-CM | POA: Diagnosis not present

## 2019-02-22 DIAGNOSIS — Q909 Down syndrome, unspecified: Secondary | ICD-10-CM | POA: Diagnosis not present

## 2019-02-22 DIAGNOSIS — Z466 Encounter for fitting and adjustment of urinary device: Secondary | ICD-10-CM | POA: Diagnosis not present

## 2019-03-27 DIAGNOSIS — Q909 Down syndrome, unspecified: Secondary | ICD-10-CM | POA: Diagnosis not present

## 2019-03-27 DIAGNOSIS — R338 Other retention of urine: Secondary | ICD-10-CM | POA: Diagnosis not present

## 2019-03-27 DIAGNOSIS — Z466 Encounter for fitting and adjustment of urinary device: Secondary | ICD-10-CM | POA: Diagnosis not present

## 2019-03-27 DIAGNOSIS — N401 Enlarged prostate with lower urinary tract symptoms: Secondary | ICD-10-CM | POA: Diagnosis not present

## 2019-04-04 DIAGNOSIS — M79676 Pain in unspecified toe(s): Secondary | ICD-10-CM | POA: Diagnosis not present

## 2019-04-04 DIAGNOSIS — M79674 Pain in right toe(s): Secondary | ICD-10-CM | POA: Diagnosis not present

## 2019-04-04 DIAGNOSIS — B351 Tinea unguium: Secondary | ICD-10-CM | POA: Diagnosis not present

## 2019-04-04 DIAGNOSIS — M79675 Pain in left toe(s): Secondary | ICD-10-CM | POA: Diagnosis not present

## 2019-04-29 DIAGNOSIS — R338 Other retention of urine: Secondary | ICD-10-CM | POA: Diagnosis not present

## 2019-04-29 DIAGNOSIS — N401 Enlarged prostate with lower urinary tract symptoms: Secondary | ICD-10-CM | POA: Diagnosis not present

## 2019-04-29 DIAGNOSIS — Z466 Encounter for fitting and adjustment of urinary device: Secondary | ICD-10-CM | POA: Diagnosis not present

## 2019-04-29 DIAGNOSIS — Q909 Down syndrome, unspecified: Secondary | ICD-10-CM | POA: Diagnosis not present

## 2019-05-20 ENCOUNTER — Encounter: Payer: Self-pay | Admitting: Family Medicine

## 2019-05-20 ENCOUNTER — Other Ambulatory Visit: Payer: Self-pay

## 2019-05-20 ENCOUNTER — Ambulatory Visit (INDEPENDENT_AMBULATORY_CARE_PROVIDER_SITE_OTHER): Payer: Medicare Other | Admitting: Family Medicine

## 2019-05-20 VITALS — BP 97/64 | HR 94 | Temp 100.2°F | Ht 60.0 in | Wt 172.5 lb

## 2019-05-20 DIAGNOSIS — N1832 Chronic kidney disease, stage 3b: Secondary | ICD-10-CM | POA: Diagnosis not present

## 2019-05-20 DIAGNOSIS — E782 Mixed hyperlipidemia: Secondary | ICD-10-CM

## 2019-05-20 DIAGNOSIS — K219 Gastro-esophageal reflux disease without esophagitis: Secondary | ICD-10-CM | POA: Diagnosis not present

## 2019-05-20 LAB — CBC WITH DIFFERENTIAL/PLATELET
Basophils Absolute: 0.1 10*3/uL (ref 0.0–0.2)
Basos: 1 %
EOS (ABSOLUTE): 0.2 10*3/uL (ref 0.0–0.4)
Eos: 3 %
Hematocrit: 45 % (ref 37.5–51.0)
Hemoglobin: 15 g/dL (ref 13.0–17.7)
Immature Grans (Abs): 0 10*3/uL (ref 0.0–0.1)
Immature Granulocytes: 0 %
Lymphocytes Absolute: 0.7 10*3/uL (ref 0.7–3.1)
Lymphs: 13 %
MCH: 34.3 pg — ABNORMAL HIGH (ref 26.6–33.0)
MCHC: 33.3 g/dL (ref 31.5–35.7)
MCV: 103 fL — ABNORMAL HIGH (ref 79–97)
Monocytes Absolute: 0.5 10*3/uL (ref 0.1–0.9)
Monocytes: 9 %
Neutrophils Absolute: 4.4 10*3/uL (ref 1.4–7.0)
Neutrophils: 74 %
Platelets: 225 10*3/uL (ref 150–450)
RBC: 4.37 x10E6/uL (ref 4.14–5.80)
RDW: 13.4 % (ref 11.6–15.4)
WBC: 5.9 10*3/uL (ref 3.4–10.8)

## 2019-05-20 LAB — CMP14+EGFR
ALT: 19 IU/L (ref 0–44)
AST: 21 IU/L (ref 0–40)
Albumin/Globulin Ratio: 1.3 (ref 1.2–2.2)
Albumin: 3.8 g/dL (ref 3.8–4.9)
Alkaline Phosphatase: 72 IU/L (ref 39–117)
BUN/Creatinine Ratio: 11 (ref 9–20)
BUN: 20 mg/dL (ref 6–24)
Bilirubin Total: 0.6 mg/dL (ref 0.0–1.2)
CO2: 26 mmol/L (ref 20–29)
Calcium: 8.9 mg/dL (ref 8.7–10.2)
Chloride: 104 mmol/L (ref 96–106)
Creatinine, Ser: 1.88 mg/dL — ABNORMAL HIGH (ref 0.76–1.27)
GFR calc Af Amer: 44 mL/min/{1.73_m2} — ABNORMAL LOW (ref >59)
GFR calc non Af Amer: 38 mL/min/{1.73_m2} — ABNORMAL LOW (ref >59)
Globulin, Total: 2.9 g/dL (ref 1.5–4.5)
Glucose: 97 mg/dL (ref 65–99)
Potassium: 4.7 mmol/L (ref 3.5–5.2)
Sodium: 142 mmol/L (ref 134–144)
Total Protein: 6.7 g/dL (ref 6.0–8.5)

## 2019-05-20 LAB — LIPID PANEL
Chol/HDL Ratio: 2.7 ratio (ref 0.0–5.0)
Cholesterol, Total: 157 mg/dL (ref 100–199)
HDL: 58 mg/dL (ref >39)
LDL Chol Calc (NIH): 85 mg/dL (ref 0–99)
Triglycerides: 74 mg/dL (ref 0–149)
VLDL Cholesterol Cal: 14 mg/dL (ref 5–40)

## 2019-05-20 NOTE — Progress Notes (Signed)
BP 97/64   Pulse 94   Temp 100.2 F (37.9 C)   Ht 5' (1.524 m)   Wt 172 lb 8 oz (78.2 kg)   SpO2 98%   BMI 33.69 kg/m    Subjective:   Patient ID: Frank Levine, male    DOB: Jan 08, 1961, 59 y.o.   MRN: [id number]  HPI: Frank Levine is a 59 y.o. male presenting on 05/20/2019 for Medical Management of Chronic Issues (53mfollow up) and Chronic Kidney Disease   HPI Patient has CKD stage III and is being managed by nephrology and seems to be stable.  Hyperlipidemia Patient is coming in for recheck of his hyperlipidemia. The patient is currently taking simvastatin. They deny any issues with myalgias or history of liver damage from it. They deny any focal numbness or weakness or chest pain.   GERD Patient is currently on pantoprazole.  She denies any major symptoms or abdominal pain or belching or burping. She denies any blood in her stool or lightheadedness or dizziness.   Relevant past medical, surgical, family and social history reviewed and updated as indicated. Interim medical history since our last visit reviewed. Allergies and medications reviewed and updated.  Review of Systems  Constitutional: Negative for chills and fever.  Eyes: Negative for visual disturbance.  Respiratory: Negative for shortness of breath and wheezing.   Cardiovascular: Negative for chest pain and leg swelling.  Musculoskeletal: Negative for back pain and gait problem.  Skin: Negative for rash.  Neurological: Negative for dizziness, weakness and light-headedness.  All other systems reviewed and are negative.   Per HPI unless specifically indicated above   Allergies as of 05/20/2019   No Known Allergies     Medication List       Accurate as of May 20, 2019 10:55 AM. If you have any questions, ask your nurse or doctor.        aspirin EC 81 MG tablet Take 81 mg by mouth daily.   diclofenac 75 MG EC tablet Commonly known as: VOLTAREN Take 1 tablet (75 mg total) by mouth 2 (two) times  daily. For muscle and  Joint pain   docusate sodium 100 MG capsule Commonly known as: COLACE Take 100 mg by mouth at bedtime.   ketoconazole 2 % cream Commonly known as: NIZORAL   pantoprazole 40 MG tablet Commonly known as: PROTONIX TAKE 1 TABLET BY MOUTH EVERY DAY IN THE MORNING   simvastatin 40 MG tablet Commonly known as: ZOCOR TAKE 1 TABLET BY MOUTH ONCE DAILY AT 6PM   Vitamin D 50 MCG (2000 UT) tablet Take 1 tablet by mouth daily.        Objective:   BP 97/64   Pulse 94   Temp 100.2 F (37.9 C)   Ht 5' (1.524 m)   Wt 172 lb 8 oz (78.2 kg)   SpO2 98%   BMI 33.69 kg/m   Wt Readings from Last 3 Encounters:  05/20/19 172 lb 8 oz (78.2 kg)  11/20/18 168 lb 6.4 oz (76.4 kg)  07/18/18 167 lb (75.8 kg)    Physical Exam Vitals and nursing note reviewed.  Constitutional:      General: He is not in acute distress.    Appearance: He is well-developed. He is not diaphoretic.  Eyes:     General: No scleral icterus.    Conjunctiva/sclera: Conjunctivae normal.  Neck:     Thyroid: No thyromegaly.  Cardiovascular:     Rate and Rhythm: Normal rate  and regular rhythm.     Heart sounds: Normal heart sounds. No murmur.  Pulmonary:     Effort: Pulmonary effort is normal. No respiratory distress.     Breath sounds: Normal breath sounds. No wheezing.  Abdominal:     General: Abdomen is flat. Bowel sounds are normal. There is no distension.     Tenderness: There is no abdominal tenderness. There is no right CVA tenderness, left CVA tenderness, guarding or rebound.  Musculoskeletal:        General: Normal range of motion.     Cervical back: Neck supple.  Lymphadenopathy:     Cervical: No cervical adenopathy.  Skin:    General: Skin is warm and dry.     Findings: No rash.  Neurological:     Mental Status: He is alert and oriented to person, place, and time.     Coordination: Coordination normal.  Psychiatric:        Behavior: Behavior normal.       Assessment &  Plan:   Problem List Items Addressed This Visit      Digestive   GERD (gastroesophageal reflux disease)   Relevant Orders   CBC with Differential/Platelet     Genitourinary   CKD (chronic kidney disease) stage 3, GFR 30-59 ml/min (Chronic)   Relevant Orders   CMP14+EGFR     Other   Hyperlipidemia - Primary   Relevant Orders   Lipid panel      No change in current medication, continue see nephrology and manage things.  He does have a catheter suprapubic that he uses. Follow up plan: Return in about 6 months (around 11/20/2019), or if symptoms worsen or fail to improve, for GERD and hyperlipidemia and CKD.  Counseling provided for all of the vaccine components Orders Placed This Encounter  Procedures  . CBC with Differential/Platelet  . CMP14+EGFR  . Lipid panel    Frank Pina, MD [facility] 05/20/2019, 10:55 AM

## 2019-05-22 NOTE — Progress Notes (Signed)
lmtcb

## 2019-05-30 ENCOUNTER — Other Ambulatory Visit: Payer: Self-pay

## 2019-05-30 ENCOUNTER — Other Ambulatory Visit: Payer: Medicare Other

## 2019-05-30 DIAGNOSIS — R809 Proteinuria, unspecified: Secondary | ICD-10-CM | POA: Diagnosis not present

## 2019-05-30 DIAGNOSIS — N184 Chronic kidney disease, stage 4 (severe): Secondary | ICD-10-CM | POA: Diagnosis not present

## 2019-05-30 DIAGNOSIS — Z79899 Other long term (current) drug therapy: Secondary | ICD-10-CM | POA: Diagnosis not present

## 2019-05-30 DIAGNOSIS — D631 Anemia in chronic kidney disease: Secondary | ICD-10-CM | POA: Diagnosis not present

## 2019-05-30 DIAGNOSIS — E559 Vitamin D deficiency, unspecified: Secondary | ICD-10-CM | POA: Diagnosis not present

## 2019-06-05 DIAGNOSIS — E875 Hyperkalemia: Secondary | ICD-10-CM | POA: Diagnosis not present

## 2019-06-05 DIAGNOSIS — Z79899 Other long term (current) drug therapy: Secondary | ICD-10-CM | POA: Diagnosis not present

## 2019-06-05 DIAGNOSIS — N1832 Chronic kidney disease, stage 3b: Secondary | ICD-10-CM | POA: Diagnosis not present

## 2019-06-05 DIAGNOSIS — E559 Vitamin D deficiency, unspecified: Secondary | ICD-10-CM | POA: Diagnosis not present

## 2019-06-05 DIAGNOSIS — R809 Proteinuria, unspecified: Secondary | ICD-10-CM | POA: Diagnosis not present

## 2019-06-13 DIAGNOSIS — M79675 Pain in left toe(s): Secondary | ICD-10-CM | POA: Diagnosis not present

## 2019-06-13 DIAGNOSIS — B351 Tinea unguium: Secondary | ICD-10-CM | POA: Diagnosis not present

## 2019-06-13 DIAGNOSIS — M79674 Pain in right toe(s): Secondary | ICD-10-CM | POA: Diagnosis not present

## 2019-06-20 DIAGNOSIS — R3914 Feeling of incomplete bladder emptying: Secondary | ICD-10-CM | POA: Diagnosis not present

## 2019-06-20 DIAGNOSIS — N130 Hydronephrosis with ureteropelvic junction obstruction: Secondary | ICD-10-CM | POA: Diagnosis not present

## 2019-06-26 DIAGNOSIS — Z466 Encounter for fitting and adjustment of urinary device: Secondary | ICD-10-CM | POA: Diagnosis not present

## 2019-06-26 DIAGNOSIS — Z435 Encounter for attention to cystostomy: Secondary | ICD-10-CM | POA: Diagnosis not present

## 2019-06-26 DIAGNOSIS — N131 Hydronephrosis with ureteral stricture, not elsewhere classified: Secondary | ICD-10-CM | POA: Diagnosis not present

## 2019-06-26 DIAGNOSIS — R338 Other retention of urine: Secondary | ICD-10-CM | POA: Diagnosis not present

## 2019-07-05 ENCOUNTER — Emergency Department (HOSPITAL_COMMUNITY): Payer: Medicare Other

## 2019-07-05 ENCOUNTER — Encounter (HOSPITAL_COMMUNITY): Payer: Self-pay | Admitting: *Deleted

## 2019-07-05 ENCOUNTER — Emergency Department (HOSPITAL_COMMUNITY)
Admission: EM | Admit: 2019-07-05 | Discharge: 2019-07-06 | Disposition: A | Discharge status: Home / Self Care | Payer: Medicare Other | Attending: Emergency Medicine | Admitting: Emergency Medicine

## 2019-07-05 DIAGNOSIS — S3991XA Unspecified injury of abdomen, initial encounter: Secondary | ICD-10-CM | POA: Diagnosis not present

## 2019-07-05 DIAGNOSIS — Y999 Unspecified external cause status: Secondary | ICD-10-CM | POA: Diagnosis not present

## 2019-07-05 DIAGNOSIS — S199XXA Unspecified injury of neck, initial encounter: Secondary | ICD-10-CM | POA: Diagnosis not present

## 2019-07-05 DIAGNOSIS — N182 Chronic kidney disease, stage 2 (mild): Secondary | ICD-10-CM | POA: Insufficient documentation

## 2019-07-05 DIAGNOSIS — S0990XA Unspecified injury of head, initial encounter: Secondary | ICD-10-CM | POA: Insufficient documentation

## 2019-07-05 DIAGNOSIS — R58 Hemorrhage, not elsewhere classified: Secondary | ICD-10-CM | POA: Diagnosis not present

## 2019-07-05 DIAGNOSIS — S3993XA Unspecified injury of pelvis, initial encounter: Secondary | ICD-10-CM | POA: Diagnosis not present

## 2019-07-05 DIAGNOSIS — R319 Hematuria, unspecified: Secondary | ICD-10-CM | POA: Diagnosis not present

## 2019-07-05 DIAGNOSIS — Y9389 Activity, other specified: Secondary | ICD-10-CM | POA: Diagnosis not present

## 2019-07-05 DIAGNOSIS — Z9350 Unspecified cystostomy status: Secondary | ICD-10-CM | POA: Insufficient documentation

## 2019-07-05 DIAGNOSIS — S299XXA Unspecified injury of thorax, initial encounter: Secondary | ICD-10-CM | POA: Diagnosis present

## 2019-07-05 DIAGNOSIS — Q909 Down syndrome, unspecified: Secondary | ICD-10-CM | POA: Diagnosis not present

## 2019-07-05 DIAGNOSIS — Y92410 Unspecified street and highway as the place of occurrence of the external cause: Secondary | ICD-10-CM | POA: Diagnosis not present

## 2019-07-05 DIAGNOSIS — Z86718 Personal history of other venous thrombosis and embolism: Secondary | ICD-10-CM | POA: Diagnosis not present

## 2019-07-05 DIAGNOSIS — S00412A Abrasion of left ear, initial encounter: Secondary | ICD-10-CM | POA: Diagnosis not present

## 2019-07-05 DIAGNOSIS — R079 Chest pain, unspecified: Secondary | ICD-10-CM | POA: Diagnosis not present

## 2019-07-05 DIAGNOSIS — S2231XA Fracture of one rib, right side, initial encounter for closed fracture: Secondary | ICD-10-CM | POA: Insufficient documentation

## 2019-07-05 DIAGNOSIS — R1011 Right upper quadrant pain: Secondary | ICD-10-CM | POA: Diagnosis not present

## 2019-07-05 LAB — CBC
HCT: 43.3 % (ref 39.0–52.0)
Hemoglobin: 14.6 g/dL (ref 13.0–17.0)
MCH: 34.2 pg — ABNORMAL HIGH (ref 26.0–34.0)
MCHC: 33.7 g/dL (ref 30.0–36.0)
MCV: 101.4 fL — ABNORMAL HIGH (ref 80.0–100.0)
Platelets: 231 10*3/uL (ref 150–400)
RBC: 4.27 MIL/uL (ref 4.22–5.81)
RDW: 13.2 % (ref 11.5–15.5)
WBC: 13 10*3/uL — ABNORMAL HIGH (ref 4.0–10.5)
nRBC: 0 % (ref 0.0–0.2)

## 2019-07-05 LAB — I-STAT CHEM 8, ED
BUN: 21 mg/dL — ABNORMAL HIGH (ref 6–20)
Calcium, Ion: 1.21 mmol/L (ref 1.15–1.40)
Chloride: 102 mmol/L (ref 98–111)
Creatinine, Ser: 2.2 mg/dL — ABNORMAL HIGH (ref 0.61–1.24)
Glucose, Bld: 117 mg/dL — ABNORMAL HIGH (ref 70–99)
HCT: 45 % (ref 39.0–52.0)
Hemoglobin: 15.3 g/dL (ref 13.0–17.0)
Potassium: 4 mmol/L (ref 3.5–5.1)
Sodium: 139 mmol/L (ref 135–145)
TCO2: 31 mmol/L (ref 22–32)

## 2019-07-05 LAB — COMPREHENSIVE METABOLIC PANEL
ALT: 22 U/L (ref 0–44)
AST: 34 U/L (ref 15–41)
Albumin: 3.8 g/dL (ref 3.5–5.0)
Alkaline Phosphatase: 64 U/L (ref 38–126)
Anion gap: 10 (ref 5–15)
BUN: 21 mg/dL — ABNORMAL HIGH (ref 6–20)
CO2: 26 mmol/L (ref 22–32)
Calcium: 8.8 mg/dL — ABNORMAL LOW (ref 8.9–10.3)
Chloride: 102 mmol/L (ref 98–111)
Creatinine, Ser: 1.85 mg/dL — ABNORMAL HIGH (ref 0.61–1.24)
GFR calc Af Amer: 45 mL/min — ABNORMAL LOW (ref >60)
GFR calc non Af Amer: 39 mL/min — ABNORMAL LOW (ref >60)
Glucose, Bld: 121 mg/dL — ABNORMAL HIGH (ref 70–99)
Potassium: 4.1 mmol/L (ref 3.5–5.1)
Sodium: 138 mmol/L (ref 135–145)
Total Bilirubin: 0.7 mg/dL (ref 0.3–1.2)
Total Protein: 7.8 g/dL (ref 6.5–8.1)

## 2019-07-05 LAB — URINALYSIS, ROUTINE W REFLEX MICROSCOPIC
Bacteria, UA: NONE SEEN
Bilirubin Urine: NEGATIVE
Glucose, UA: NEGATIVE mg/dL
Ketones, ur: NEGATIVE mg/dL
Leukocytes,Ua: NEGATIVE
Nitrite: NEGATIVE
Protein, ur: NEGATIVE mg/dL
RBC / HPF: >50 RBC/hpf — ABNORMAL HIGH (ref 0–5)
Specific Gravity, Urine: 1 — ABNORMAL LOW (ref 1.005–1.030)
pH: 7 (ref 5.0–8.0)

## 2019-07-05 LAB — PROTIME-INR
INR: 1 (ref 0.8–1.2)
Prothrombin Time: 12.3 seconds (ref 11.4–15.2)

## 2019-07-05 LAB — LACTIC ACID, PLASMA: Lactic Acid, Venous: 1.1 mmol/L (ref 0.5–1.9)

## 2019-07-05 LAB — SAMPLE TO BLOOD BANK

## 2019-07-05 LAB — ETHANOL: Alcohol, Ethyl (B): <10 mg/dL (ref <10)

## 2019-07-05 MED ORDER — SODIUM CHLORIDE 0.9 % IV BOLUS
500.0000 mL | Freq: Once | INTRAVENOUS | Status: AC
  Administered 2019-07-05: 20:00:00 500 mL via INTRAVENOUS

## 2019-07-05 MED ORDER — IOHEXOL 300 MG/ML  SOLN
100.0000 mL | Freq: Once | INTRAMUSCULAR | Status: AC | PRN
  Administered 2019-07-05: 21:00:00 100 mL via INTRAVENOUS

## 2019-07-05 MED ORDER — CEPHALEXIN 500 MG PO CAPS
500.0000 mg | ORAL_CAPSULE | Freq: Once | ORAL | Status: AC
  Administered 2019-07-05: 500 mg via ORAL
  Filled 2019-07-05: qty 1

## 2019-07-05 MED ORDER — MORPHINE SULFATE (PF) 2 MG/ML IV SOLN
2.0000 mg | Freq: Once | INTRAVENOUS | Status: AC
  Administered 2019-07-05: 22:00:00 2 mg via INTRAVENOUS
  Filled 2019-07-05: qty 1
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
## 2019-07-06 MED ORDER — CEPHALEXIN 500 MG PO CAPS
500.0000 mg | ORAL_CAPSULE | Freq: Four times a day (QID) | ORAL | 0 refills | Status: DC
Start: 2019-07-06 — End: 2019-07-25
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
## 2019-07-08 ENCOUNTER — Ambulatory Visit: Payer: Medicare Other | Admitting: Family Medicine

## 2019-07-09 ENCOUNTER — Encounter: Payer: Self-pay | Admitting: Family Medicine

## 2019-07-09 ENCOUNTER — Other Ambulatory Visit: Payer: Self-pay

## 2019-07-09 ENCOUNTER — Ambulatory Visit (INDEPENDENT_AMBULATORY_CARE_PROVIDER_SITE_OTHER): Payer: Medicare Other | Admitting: Family Medicine

## 2019-07-09 VITALS — BP 93/68 | HR 84 | Temp 98.3°F | Ht 60.0 in | Wt 171.8 lb

## 2019-07-09 DIAGNOSIS — R310 Gross hematuria: Secondary | ICD-10-CM | POA: Diagnosis not present

## 2019-07-09 DIAGNOSIS — N35819 Other urethral stricture, male, unspecified site: Secondary | ICD-10-CM

## 2019-07-09 DIAGNOSIS — R399 Unspecified symptoms and signs involving the genitourinary system: Secondary | ICD-10-CM | POA: Diagnosis not present

## 2019-07-09 DIAGNOSIS — S2231XA Fracture of one rib, right side, initial encounter for closed fracture: Secondary | ICD-10-CM | POA: Diagnosis not present

## 2019-07-09 LAB — URINALYSIS, COMPLETE
Bilirubin, UA: NEGATIVE
Glucose, UA: NEGATIVE
Ketones, UA: NEGATIVE
Nitrite, UA: NEGATIVE
Specific Gravity, UA: 1.015 (ref 1.005–1.030)
Urobilinogen, Ur: 0.2 mg/dL (ref 0.2–1.0)
pH, UA: 6.5 (ref 5.0–7.5)

## 2019-07-09 LAB — URINE CULTURE: Culture: >=100000 — AB

## 2019-07-09 LAB — MICROSCOPIC EXAMINATION
Epithelial Cells (non renal): NONE SEEN /hpf (ref 0–10)
Renal Epithel, UA: NONE SEEN /hpf
WBC, UA: >30 /hpf — AB (ref 0–5)

## 2019-07-09 MED ORDER — SULFAMETHOXAZOLE-TRIMETHOPRIM 800-160 MG PO TABS: 1.0000 | ORAL_TABLET | Freq: Two times a day (BID) | ORAL | 0 refills | Status: DC
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
## 2019-07-10 ENCOUNTER — Telehealth: Payer: Self-pay | Admitting: *Deleted

## 2019-07-10 DIAGNOSIS — R310 Gross hematuria: Secondary | ICD-10-CM | POA: Diagnosis not present

## 2019-07-10 DIAGNOSIS — N3020 Other chronic cystitis without hematuria: Secondary | ICD-10-CM | POA: Diagnosis not present

## 2019-07-10 DIAGNOSIS — N130 Hydronephrosis with ureteropelvic junction obstruction: Secondary | ICD-10-CM | POA: Diagnosis not present

## 2019-07-10 NOTE — Progress Notes (Signed)
ED Antimicrobial Stewardship Positive Culture Follow Up   Frank Levine is an 59 y.o. male who presented to [facility] on 07/05/2019 with a chief complaint of  Chief Complaint  Patient presents with  . Marine scientist    Recent Results (from the past 720 hour(s))  Urine culture     Status: Abnormal   Collection Time: 07/05/19 11:27 PM   Specimen: Urine, Clean Catch  Result Value Ref Range Status   Specimen Description   Final    URINE, CLEAN CATCH Performed at [facility], [address] [place], [place] [address]    Special Requests   Final    NONE Performed at [facility], [address] [place], [place] [address]    Culture (A)  Final    >=100,000 COLONIES/mL SERRATIA MARCESCENS 50,000 COLONIES/mL PROTEUS VULGARIS    Report Status 07/09/2019 FINAL  Final   Organism ID, Bacteria SERRATIA MARCESCENS (A)  Final   Organism ID, Bacteria PROTEUS VULGARIS (A)  Final      Susceptibility   Proteus vulgaris - MIC*    AMPICILLIN >=32 RESISTANT Resistant     CEFAZOLIN >=64 RESISTANT Resistant     CEFTRIAXONE <=1 SENSITIVE Sensitive     CIPROFLOXACIN <=0.25 SENSITIVE Sensitive     GENTAMICIN <=1 SENSITIVE Sensitive     IMIPENEM 4 SENSITIVE Sensitive     NITROFURANTOIN 128 RESISTANT Resistant     TRIMETH/SULFA <=20 SENSITIVE Sensitive     AMPICILLIN/SULBACTAM 16 INTERMEDIATE Intermediate     PIP/TAZO <=4 SENSITIVE Sensitive     * 50,000 COLONIES/mL PROTEUS VULGARIS   Serratia marcescens - MIC*    CEFAZOLIN >=64 RESISTANT Resistant     CEFTRIAXONE <=1 SENSITIVE Sensitive     CIPROFLOXACIN <=0.25 SENSITIVE Sensitive     GENTAMICIN <=1 SENSITIVE Sensitive     NITROFURANTOIN 256 RESISTANT Resistant     TRIMETH/SULFA <=20 SENSITIVE Sensitive     * >=100,000 COLONIES/mL SERRATIA MARCESCENS  Microscopic Examination     Status: Abnormal   Collection Time: 07/09/19  9:30 AM   URINE  Result Value Ref Range Status   WBC, UA >30 (A) 0 - 5 /hpf Final   RBC 3-10 (A) 0 - 2 /hpf  Final   Epithelial Cells (non renal) None seen 0 - 10 /hpf Final   Renal Epithel, UA None seen None seen /hpf Final   Bacteria, UA Few None seen/Few Final    [x]  Treated with cephalexin, organism resistant to prescribed antimicrobial []  Patient discharged originally without antimicrobial agent and treatment is now indicated  New antibiotic prescription: 1 Bactrim DS BID x 10 days (prescription sent and picked up at outside pharmacy)  ED Provider: Recardo Evangelist, PharmD 07/10/2019, 10:05 AM Clinical Pharmacist Monday - Friday phone -  [phone] Saturday - Sunday phone - [phone]

## 2019-07-10 NOTE — Telephone Encounter (Signed)
Post ED Visit - Positive Culture Follow-up: Successful Patient Follow-Up  Culture assessed and recommendations reviewed by:  []  Elenor Quinones, Pharm.D. []  Heide Guile, Pharm.D., BCPS AQ-ID []  Parks Neptune, Pharm.D., BCPS []  Alycia Rossetti, Pharm.D., BCPS []  [place], Pharm.D., BCPS, AAHIVP []  Legrand Como, Pharm.D., BCPS, AAHIVP []  Salome Arnt, PharmD, BCPS []  Johnnette Gourd, PharmD, BCPS []  Hughes Better, PharmD, BCPS []  Leeroy Cha, PharmD  Positive urine culture  []  Patient discharged without antimicrobial prescription and treatment is now indicated [x]  Organism is resistant to prescribed ED discharge antimicrobial []  Patient with positive blood cultures  Changes discussed with ED provider Arlean Hopping, PA Stop Keflex and continue Bactrim as prescribed by PCP   Contacted caregiver of patient, date 07/10/2019, time [place], [place] 07/10/2019, 10:26 AM

## 2019-07-11 LAB — URINE CULTURE

## 2019-07-22 ENCOUNTER — Telehealth: Payer: Self-pay | Admitting: Family Medicine

## 2019-07-23 DIAGNOSIS — N131 Hydronephrosis with ureteral stricture, not elsewhere classified: Secondary | ICD-10-CM | POA: Diagnosis not present

## 2019-07-23 DIAGNOSIS — R338 Other retention of urine: Secondary | ICD-10-CM | POA: Diagnosis not present

## 2019-07-23 DIAGNOSIS — Z466 Encounter for fitting and adjustment of urinary device: Secondary | ICD-10-CM | POA: Diagnosis not present

## 2019-07-23 DIAGNOSIS — Z435 Encounter for attention to cystostomy: Secondary | ICD-10-CM | POA: Diagnosis not present

## 2019-07-25 ENCOUNTER — Other Ambulatory Visit: Payer: Self-pay

## 2019-07-25 ENCOUNTER — Ambulatory Visit (INDEPENDENT_AMBULATORY_CARE_PROVIDER_SITE_OTHER): Payer: Medicare Other | Admitting: Physician Assistant

## 2019-07-25 ENCOUNTER — Encounter: Payer: Self-pay | Admitting: Physician Assistant

## 2019-07-25 VITALS — BP 117/70 | HR 80 | Temp 97.6°F | Resp 20 | Ht 60.0 in | Wt 172.4 lb

## 2019-07-25 DIAGNOSIS — R809 Proteinuria, unspecified: Secondary | ICD-10-CM | POA: Diagnosis not present

## 2019-07-25 DIAGNOSIS — Q909 Down syndrome, unspecified: Secondary | ICD-10-CM

## 2019-07-25 DIAGNOSIS — K219 Gastro-esophageal reflux disease without esophagitis: Secondary | ICD-10-CM | POA: Diagnosis not present

## 2019-07-25 DIAGNOSIS — E559 Vitamin D deficiency, unspecified: Secondary | ICD-10-CM | POA: Diagnosis not present

## 2019-07-25 DIAGNOSIS — N183 Chronic kidney disease, stage 3 unspecified: Secondary | ICD-10-CM | POA: Diagnosis not present

## 2019-07-25 DIAGNOSIS — Z79899 Other long term (current) drug therapy: Secondary | ICD-10-CM | POA: Diagnosis not present

## 2019-07-25 DIAGNOSIS — N1832 Chronic kidney disease, stage 3b: Secondary | ICD-10-CM | POA: Diagnosis not present
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
## 2019-07-29 ENCOUNTER — Other Ambulatory Visit: Payer: Self-pay

## 2019-07-29 ENCOUNTER — Ambulatory Visit (INDEPENDENT_AMBULATORY_CARE_PROVIDER_SITE_OTHER): Payer: Medicare Other | Admitting: *Deleted

## 2019-07-29 DIAGNOSIS — Z111 Encounter for screening for respiratory tuberculosis: Secondary | ICD-10-CM | POA: Diagnosis not present

## 2019-07-29 NOTE — Progress Notes (Signed)
Patient in for PPD placement. Place intradermally in left forearm. Patient tolerated well. Appointment to read test scheduled for Thursday 08/01/19.

## 2019-08-01 ENCOUNTER — Other Ambulatory Visit: Payer: Self-pay

## 2019-08-01 ENCOUNTER — Ambulatory Visit: Payer: Medicare Other | Admitting: *Deleted

## 2019-08-01 DIAGNOSIS — Z111 Encounter for screening for respiratory tuberculosis: Secondary | ICD-10-CM

## 2019-08-01 LAB — TB SKIN TEST
Induration: 0 mm
TB Skin Test: NEGATIVE

## 2019-08-01 NOTE — Progress Notes (Signed)
TB read negative

## 2019-08-07 DIAGNOSIS — N1832 Chronic kidney disease, stage 3b: Secondary | ICD-10-CM | POA: Diagnosis not present

## 2019-08-07 DIAGNOSIS — R809 Proteinuria, unspecified: Secondary | ICD-10-CM | POA: Diagnosis not present

## 2019-08-07 DIAGNOSIS — E875 Hyperkalemia: Secondary | ICD-10-CM | POA: Diagnosis not present

## 2019-08-07 DIAGNOSIS — E559 Vitamin D deficiency, unspecified: Secondary | ICD-10-CM | POA: Diagnosis not present

## 2019-08-10 ENCOUNTER — Other Ambulatory Visit: Payer: Self-pay | Admitting: Family Medicine

## 2019-08-10 DIAGNOSIS — K219 Gastro-esophageal reflux disease without esophagitis: Secondary | ICD-10-CM

## 2019-08-22 DIAGNOSIS — R338 Other retention of urine: Secondary | ICD-10-CM | POA: Diagnosis not present

## 2019-08-22 DIAGNOSIS — B351 Tinea unguium: Secondary | ICD-10-CM | POA: Diagnosis not present

## 2019-08-22 DIAGNOSIS — M79676 Pain in unspecified toe(s): Secondary | ICD-10-CM | POA: Diagnosis not present

## 2019-08-22 DIAGNOSIS — N131 Hydronephrosis with ureteral stricture, not elsewhere classified: Secondary | ICD-10-CM | POA: Diagnosis not present

## 2019-08-22 DIAGNOSIS — Z466 Encounter for fitting and adjustment of urinary device: Secondary | ICD-10-CM | POA: Diagnosis not present

## 2019-08-22 DIAGNOSIS — Z435 Encounter for attention to cystostomy: Secondary | ICD-10-CM | POA: Diagnosis not present

## 2019-09-02 ENCOUNTER — Encounter: Payer: Self-pay | Admitting: *Deleted

## 2019-09-02 ENCOUNTER — Telehealth: Payer: Self-pay | Admitting: Family Medicine

## 2019-09-02 NOTE — Telephone Encounter (Signed)
Aware note is ready.

## 2019-09-02 NOTE — Telephone Encounter (Signed)
I agree, go ahead and write the letter saying that he has early dementia and Down syndrome and is unable to manage his own finances or take care of himself. Caryl Pina, MD [facility] 09/02/2019, 1:53 PM

## 2019-09-02 NOTE — Telephone Encounter (Signed)
Frank Levine needs a letter to talk to guardian hearing--the letter needs to state his medical condition and that he is not able to talk to care of himself. He cannot manage his health care and finances.  The hearing is Wednesday. Please call when ready.

## 2019-09-02 NOTE — Telephone Encounter (Signed)
Please write statement containing all requesting information.  This will be used and probably filed when taken to court. Thanks.

## 2019-09-02 NOTE — Telephone Encounter (Signed)
Letter awaiting physician's signature.

## 2019-09-12 ENCOUNTER — Ambulatory Visit: Payer: Medicare Other | Admitting: Family Medicine

## 2019-09-17 ENCOUNTER — Telehealth: Payer: Self-pay | Admitting: Family Medicine

## 2019-09-20 DIAGNOSIS — N131 Hydronephrosis with ureteral stricture, not elsewhere classified: Secondary | ICD-10-CM | POA: Diagnosis not present

## 2019-09-20 DIAGNOSIS — R338 Other retention of urine: Secondary | ICD-10-CM | POA: Diagnosis not present

## 2019-09-20 DIAGNOSIS — Z466 Encounter for fitting and adjustment of urinary device: Secondary | ICD-10-CM | POA: Diagnosis not present

## 2019-09-20 DIAGNOSIS — Z435 Encounter for attention to cystostomy: Secondary | ICD-10-CM | POA: Diagnosis not present

## 2019-10-03 ENCOUNTER — Other Ambulatory Visit: Payer: Self-pay

## 2019-10-03 ENCOUNTER — Ambulatory Visit (INDEPENDENT_AMBULATORY_CARE_PROVIDER_SITE_OTHER): Payer: Medicare Other | Admitting: Family

## 2019-10-03 ENCOUNTER — Encounter: Payer: Self-pay | Admitting: Family

## 2019-10-03 VITALS — BP 96/69 | HR 92 | Temp 97.2°F | Ht 60.0 in | Wt 174.4 lb

## 2019-10-03 DIAGNOSIS — W19XXXA Unspecified fall, initial encounter: Secondary | ICD-10-CM | POA: Diagnosis not present

## 2019-10-03 DIAGNOSIS — R109 Unspecified abdominal pain: Secondary | ICD-10-CM | POA: Diagnosis not present

## 2019-10-03 DIAGNOSIS — Q909 Down syndrome, unspecified: Secondary | ICD-10-CM | POA: Diagnosis not present

## 2019-10-03 DIAGNOSIS — Y92009 Unspecified place in unspecified non-institutional (private) residence as the place of occurrence of the external cause: Secondary | ICD-10-CM | POA: Diagnosis not present

## 2019-10-03 LAB — URINALYSIS, COMPLETE
Bilirubin, UA: NEGATIVE
Glucose, UA: NEGATIVE
Ketones, UA: NEGATIVE
Nitrite, UA: NEGATIVE
Protein,UA: NEGATIVE
Specific Gravity, UA: 1.01 (ref 1.005–1.030)
Urobilinogen, Ur: 0.2 mg/dL (ref 0.2–1.0)
pH, UA: 7 (ref 5.0–7.5)

## 2019-10-03 LAB — MICROSCOPIC EXAMINATION
Epithelial Cells (non renal): NONE SEEN /hpf (ref 0–10)
RBC, Urine: NONE SEEN /hpf (ref 0–2)

## 2019-10-03 NOTE — Progress Notes (Signed)
Subjective:    Patient ID: Frank Levine, male    DOB: 1960/09/22, 59 y.o.   MRN: [id number]  Chief Complaint  Patient presents with  . Back Pain    had recent fall   Pt presents to the office today with left thoracici pain that started yesterday. He fell about a week and half ago that landed on his buttocks. No pain at that time. States yesterday he fell forward after he got his leg caught in the table. He then started complaining of back pain. He has a subpubic catheter and is followed by Urologists.    He has Down Syndrome and goes to a Day Program Monday through Friday.  Back Pain This is a new problem. The current episode started yesterday. The problem occurs intermittently. The problem has been waxing and waning since onset. The pain is present in the thoracic spine. The quality of the pain is described as aching.      Review of Systems  Musculoskeletal: Positive for back pain.  All other systems reviewed and are negative.      Objective:   Physical Exam Vitals reviewed.  Constitutional:      General: He is not in acute distress.    Appearance: He is well-developed. He is obese.  HENT:     Head: Microcephalic.  Eyes:     General:        Right eye: No discharge.        Left eye: No discharge.     Pupils: Pupils are equal, round, and reactive to light.  Neck:     Thyroid: No thyromegaly.  Cardiovascular:     Rate and Rhythm: Normal rate and regular rhythm.     Heart sounds: Normal heart sounds. No murmur heard.   Pulmonary:     Effort: Pulmonary effort is normal. No respiratory distress.     Breath sounds: Normal breath sounds. No wheezing.  Abdominal:     General: Bowel sounds are normal. There is no distension.     Palpations: Abdomen is soft.     Tenderness: There is no abdominal tenderness.  Musculoskeletal:        General: No tenderness. Normal range of motion.     Cervical back: Normal range of motion and neck supple.  Skin:    General: Skin is warm  and dry.     Findings: No erythema or rash.  Neurological:     Mental Status: He is alert and oriented to person, place, and time.     Cranial Nerves: No cranial nerve deficit.     Deep Tendon Reflexes: Reflexes are normal and symmetric.  Psychiatric:        Behavior: Behavior normal.        Thought Content: Thought content normal.        Judgment: Judgment normal.     BP 96/69   Pulse 92   Temp (!) 97.2 F (36.2 C) (Temporal)   Ht 5' (1.524 m)   Wt 174 lb 6.4 oz (79.1 kg)   SpO2 99%   BMI 34.06 kg/m        Assessment & Plan:  ALIJAH AKRAM comes in today with chief complaint of Back Pain (had recent fall)   Diagnosis and orders addressed:  1. Flank pain - Urinalysis, Complete - Urine Culture  2. Down's syndrome  3. Fall in home, initial encounter   Urine not bad today, will send off for culture as I think it is  contamination. Will treat more like MSK Rest Tylenol as needed Fall preventions discussed   Evelina Dun, FNP

## 2019-10-03 NOTE — Patient Instructions (Signed)
Acute Back Pain, Adult Acute back pain is sudden and usually short-lived. It is often caused by an injury to the muscles and tissues in the back. The injury may result from:  A muscle or ligament getting overstretched or torn (strained). Ligaments are tissues that connect bones to each other. Lifting something improperly can cause a back strain.  Wear and tear (degeneration) of the spinal disks. Spinal disks are circular tissue that provides cushioning between the bones of the spine (vertebrae).  Twisting motions, such as while playing sports or doing yard work.  A hit to the back.  Arthritis. You may have a physical exam, lab tests, and imaging tests to find the cause of your pain. Acute back pain usually goes away with rest and home care. Follow these instructions at home: Managing pain, stiffness, and swelling  Take over-the-counter and prescription medicines only as told by your health care provider.  Your health care provider may recommend applying ice during the first 24-48 hours after your pain starts. To do this: ? Put ice in a plastic bag. ? Place a towel between your skin and the bag. ? Leave the ice on for 20 minutes, 2-3 times a day.  If directed, apply heat to the affected area as often as told by your health care provider. Use the heat source that your health care provider recommends, such as a moist heat pack or a heating pad. ? Place a towel between your skin and the heat source. ? Leave the heat on for 20-30 minutes. ? Remove the heat if your skin turns bright red. This is especially important if you are unable to feel pain, heat, or cold. You have a greater risk of getting burned. Activity   Do not stay in bed. Staying in bed for more than 1-2 days can delay your recovery.  Sit up and stand up straight. Avoid leaning forward when you sit, or hunching over when you stand. ? If you work at a desk, sit close to it so you do not need to lean over. Keep your chin tucked  in. Keep your neck drawn back, and keep your elbows bent at a right angle. Your arms should look like the letter "L." ? Sit high and close to the steering wheel when you drive. Add lower back (lumbar) support to your car seat, if needed.  Take short walks on even surfaces as soon as you are able. Try to increase the length of time you walk each day.  Do not sit, drive, or stand in one place for more than 30 minutes at a time. Sitting or standing for long periods of time can put stress on your back.  Do not drive or use heavy machinery while taking prescription pain medicine.  Use proper lifting techniques. When you bend and lift, use positions that put less stress on your back: ? Bend your knees. ? Keep the load close to your body. ? Avoid twisting.  Exercise regularly as told by your health care provider. Exercising helps your back heal faster and helps prevent back injuries by keeping muscles strong and flexible.  Work with a physical therapist to make a safe exercise program, as recommended by your health care provider. Do any exercises as told by your physical therapist. Lifestyle  Maintain a healthy weight. Extra weight puts stress on your back and makes it difficult to have good posture.  Avoid activities or situations that make you feel anxious or stressed. Stress and anxiety increase muscle   tension and can make back pain worse. Learn ways to manage anxiety and stress, such as through exercise. General instructions  Sleep on a firm mattress in a comfortable position. Try lying on your side with your knees slightly bent. If you lie on your back, put a pillow under your knees.  Follow your treatment plan as told by your health care provider. This may include: ? Cognitive or behavioral therapy. ? Acupuncture or massage therapy. ? Meditation or yoga. Contact a health care provider if:  You have pain that is not relieved with rest or medicine.  You have increasing pain going down  into your legs or buttocks.  Your pain does not improve after 2 weeks.  You have pain at night.  You lose weight without trying.  You have a fever or chills. Get help right away if:  You develop new bowel or bladder control problems.  You have unusual weakness or numbness in your arms or legs.  You develop nausea or vomiting.  You develop abdominal pain.  You feel faint. Summary  Acute back pain is sudden and usually short-lived.  Use proper lifting techniques. When you bend and lift, use positions that put less stress on your back.  Take over-the-counter and prescription medicines and apply heat or ice as directed by your health care provider. This information is not intended to replace advice given to you by your health care provider. Make sure you discuss any questions you have with your health care provider. Document Revised: 06/12/2018 Document Reviewed: 10/05/2016 Elsevier Patient Education  2020 Elsevier Inc.  

## 2019-10-05 LAB — URINE CULTURE

## 2019-10-07 ENCOUNTER — Other Ambulatory Visit: Payer: Self-pay | Admitting: Family

## 2019-10-07 MED ORDER — CIPROFLOXACIN HCL 500 MG PO TABS
500.0000 mg | ORAL_TABLET | Freq: Two times a day (BID) | ORAL | 0 refills | Status: AC
Start: 2019-10-07 — End: 2019-10-17

## 2019-10-09 ENCOUNTER — Telehealth: Payer: Self-pay | Admitting: Family Medicine

## 2019-10-16 NOTE — Telephone Encounter (Signed)
Butch Penny aware of results and rx for cipro needs to go to [facility]. Elta Gu[place]oupe this as default. He is no longer using [facility]

## 2019-10-21 DIAGNOSIS — Z466 Encounter for fitting and adjustment of urinary device: Secondary | ICD-10-CM | POA: Diagnosis not present

## 2019-10-21 DIAGNOSIS — R338 Other retention of urine: Secondary | ICD-10-CM | POA: Diagnosis not present

## 2019-10-21 DIAGNOSIS — Z435 Encounter for attention to cystostomy: Secondary | ICD-10-CM | POA: Diagnosis not present

## 2019-10-21 DIAGNOSIS — N131 Hydronephrosis with ureteral stricture, not elsewhere classified: Secondary | ICD-10-CM | POA: Diagnosis not present

## 2019-10-31 DIAGNOSIS — B351 Tinea unguium: Secondary | ICD-10-CM | POA: Diagnosis not present

## 2019-10-31 DIAGNOSIS — M79676 Pain in unspecified toe(s): Secondary | ICD-10-CM | POA: Diagnosis not present

## 2019-11-08 ENCOUNTER — Other Ambulatory Visit: Payer: Medicare Other

## 2019-11-08 ENCOUNTER — Other Ambulatory Visit: Payer: Self-pay

## 2019-11-08 DIAGNOSIS — R809 Proteinuria, unspecified: Secondary | ICD-10-CM | POA: Diagnosis not present

## 2019-11-08 DIAGNOSIS — N1832 Chronic kidney disease, stage 3b: Secondary | ICD-10-CM | POA: Diagnosis not present

## 2019-11-08 DIAGNOSIS — E875 Hyperkalemia: Secondary | ICD-10-CM | POA: Diagnosis not present

## 2019-11-08 DIAGNOSIS — E559 Vitamin D deficiency, unspecified: Secondary | ICD-10-CM | POA: Diagnosis not present

## 2019-11-14 DIAGNOSIS — M79673 Pain in unspecified foot: Secondary | ICD-10-CM | POA: Diagnosis not present

## 2019-11-14 DIAGNOSIS — N1832 Chronic kidney disease, stage 3b: Secondary | ICD-10-CM | POA: Diagnosis not present

## 2019-11-14 DIAGNOSIS — M19071 Primary osteoarthritis, right ankle and foot: Secondary | ICD-10-CM | POA: Diagnosis not present

## 2019-11-14 DIAGNOSIS — I129 Hypertensive chronic kidney disease with stage 1 through stage 4 chronic kidney disease, or unspecified chronic kidney disease: Secondary | ICD-10-CM | POA: Diagnosis not present

## 2019-11-14 DIAGNOSIS — M19072 Primary osteoarthritis, left ankle and foot: Secondary | ICD-10-CM | POA: Diagnosis not present

## 2019-11-14 DIAGNOSIS — R809 Proteinuria, unspecified: Secondary | ICD-10-CM | POA: Diagnosis not present

## 2019-11-14 DIAGNOSIS — E875 Hyperkalemia: Secondary | ICD-10-CM | POA: Diagnosis not present

## 2019-11-20 ENCOUNTER — Other Ambulatory Visit: Payer: Self-pay

## 2019-11-20 ENCOUNTER — Encounter: Payer: Self-pay | Admitting: Family Medicine

## 2019-11-20 ENCOUNTER — Ambulatory Visit (INDEPENDENT_AMBULATORY_CARE_PROVIDER_SITE_OTHER): Payer: Medicare Other | Admitting: Family Medicine

## 2019-11-20 VITALS — BP 103/76 | HR 97 | Temp 98.0°F | Ht 60.0 in | Wt 176.0 lb

## 2019-11-20 DIAGNOSIS — Z23 Encounter for immunization: Secondary | ICD-10-CM | POA: Diagnosis not present

## 2019-11-20 DIAGNOSIS — F0280 Dementia in other diseases classified elsewhere without behavioral disturbance: Secondary | ICD-10-CM

## 2019-11-20 DIAGNOSIS — N131 Hydronephrosis with ureteral stricture, not elsewhere classified: Secondary | ICD-10-CM | POA: Diagnosis not present

## 2019-11-20 DIAGNOSIS — Z435 Encounter for attention to cystostomy: Secondary | ICD-10-CM | POA: Diagnosis not present

## 2019-11-20 DIAGNOSIS — E782 Mixed hyperlipidemia: Secondary | ICD-10-CM

## 2019-11-20 DIAGNOSIS — K219 Gastro-esophageal reflux disease without esophagitis: Secondary | ICD-10-CM | POA: Diagnosis not present

## 2019-11-20 DIAGNOSIS — N183 Chronic kidney disease, stage 3 unspecified: Secondary | ICD-10-CM | POA: Diagnosis not present

## 2019-11-20 DIAGNOSIS — Z466 Encounter for fitting and adjustment of urinary device: Secondary | ICD-10-CM | POA: Diagnosis not present

## 2019-11-20 DIAGNOSIS — R338 Other retention of urine: Secondary | ICD-10-CM | POA: Diagnosis not present

## 2019-11-20 DIAGNOSIS — Q909 Down syndrome, unspecified: Secondary | ICD-10-CM

## 2019-11-20 DIAGNOSIS — G300 Alzheimer's disease with early onset: Secondary | ICD-10-CM

## 2019-11-20 MED ORDER — DONEPEZIL HCL 10 MG PO TABS: 10.0000 mg | ORAL_TABLET | Freq: Every day | ORAL | 3 refills | Status: DC

## 2019-11-20 NOTE — Progress Notes (Signed)
BP 103/76   Pulse 97   Temp 98 F (36.7 C)   Ht 5' (1.524 m)   Wt 176 lb (79.8 kg)   SpO2 98%   BMI 34.37 kg/m    Subjective:   Patient ID: Frank Levine, male    DOB: [birth year], 59 y.o.   MRN: [id number]  HPI: Frank Levine is a 59 y.o. male presenting on 11/20/2019 for Medical Management of Chronic Issues, Chronic Kidney Disease, and Hyperlipidemia   HPI Patient has been having more memory issues and feels like this is worsening.  He has been increasing on this over the past few years gradually and they said find a point where they like to try something to see if it helps.  They deny any major behavioral disturbances but just that he is forgetting more and more and they are having to take more care of him.  CKD recheck Patient sees a nephrologist but will go ahead and recheck blood work and send it to them for his CKD just to see if it stable.  Hyperlipidemia Patient is coming in for recheck of his hyperlipidemia. The patient is currently taking simvastatin. They deny any issues with myalgias or history of liver damage from it. They deny any focal numbness or weakness or chest pain.   GERD Patient is currently on pantoprazole.  She denies any major symptoms or abdominal pain or belching or burping. She denies any blood in her stool or lightheadedness or dizziness.   Relevant past medical, surgical, family and social history reviewed and updated as indicated. Interim medical history since our last visit reviewed. Allergies and medications reviewed and updated.  Review of Systems  Constitutional: Negative for chills and fever.  Eyes: Negative for visual disturbance.  Respiratory: Negative for shortness of breath and wheezing.   Cardiovascular: Negative for chest pain and leg swelling.  Musculoskeletal: Negative for back pain and gait problem.  Skin: Negative for rash.  Neurological: Negative for dizziness, weakness and light-headedness.  Psychiatric/Behavioral: Positive for  confusion and decreased concentration. Negative for sleep disturbance.  All other systems reviewed and are negative.   Per HPI unless specifically indicated above   Allergies as of 11/20/2019   No Known Allergies     Medication List       Accurate as of November 20, 2019 11:45 AM. If you have any questions, ask your nurse or doctor.        acetaminophen 325 MG tablet Commonly known as: TYLENOL Take 650 mg by mouth every 6 (six) hours as needed.   aspirin EC 81 MG tablet Take 81 mg by mouth daily.   cholecalciferol 25 MCG (1000 UNIT) tablet Commonly known as: VITAMIN D3 Take 1,000 Units by mouth daily.   clotrimazole 1 % cream Commonly known as: LOTRIMIN APPLY ONCE TO TWICE DAILY TO BOTTOMS OF FEET AND BETWEEN TOES   clotrimazole-betamethasone cream Commonly known as: LOTRISONE APPLY TO AFFECTED SKIN ONCE DAILY FOR NO LONGER THAN 7 CONSECUTIVE DAYS   docusate sodium 100 MG capsule Commonly known as: COLACE Take 100 mg by mouth daily as needed.   donepezil 10 MG tablet Commonly known as: Aricept Take 1 tablet (10 mg total) by mouth at bedtime. Started by: Fransisca Kaufmann Zahniya Zellars, MD   pantoprazole 40 MG tablet Commonly known as: PROTONIX TAKE 1 TABLET BY MOUTH EVERY DAY IN THE MORNING   simvastatin 40 MG tablet Commonly known as: ZOCOR TAKE 1 TABLET BY MOUTH ONCE DAILY AT [facility]  Objective:   BP 103/76   Pulse 97   Temp 98 F (36.7 C)   Ht 5' (1.524 m)   Wt 176 lb (79.8 kg)   SpO2 98%   BMI 34.37 kg/m   Wt Readings from Last 3 Encounters:  11/20/19 176 lb (79.8 kg)  10/03/19 174 lb 6.4 oz (79.1 kg)  07/25/19 172 lb 6 oz (78.2 kg)    Physical Exam Vitals and nursing note reviewed.  Constitutional:      General: He is not in acute distress.    Appearance: He is well-developed. He is not diaphoretic.  Eyes:     General: No scleral icterus.       Right eye: No discharge.     Conjunctiva/sclera: Conjunctivae normal.     Pupils: Pupils are  equal, round, and reactive to light.  Neck:     Thyroid: No thyromegaly.  Cardiovascular:     Rate and Rhythm: Normal rate and regular rhythm.     Heart sounds: Normal heart sounds. No murmur heard.   Pulmonary:     Effort: Pulmonary effort is normal. No respiratory distress.     Breath sounds: Normal breath sounds. No wheezing.  Musculoskeletal:        General: Normal range of motion.     Cervical back: Neck supple.  Lymphadenopathy:     Cervical: No cervical adenopathy.  Skin:    General: Skin is warm and dry.     Findings: No rash.  Neurological:     Mental Status: He is alert and oriented to person, place, and time.     Coordination: Coordination normal.  Psychiatric:        Behavior: Behavior normal.        Cognition and Memory: Memory is impaired. He exhibits impaired recent memory.       Assessment & Plan:   Problem List Items Addressed This Visit      Digestive   GERD (gastroesophageal reflux disease)   Relevant Orders   CBC with Differential/Platelet     Genitourinary   CKD (chronic kidney disease) stage 3, GFR 30-59 ml/min (Chronic)   Relevant Orders   CMP14+EGFR     Other   Down's syndrome   Hyperlipidemia   Relevant Orders   Lipid panel    Other Visit Diagnoses    Flu vaccine need    -  Primary   Relevant Orders   Flu Vaccine QUAD 6+ mos PF IM (Fluarix Quad PF) (Completed)   Early onset Alzheimer's dementia without behavioral disturbance (HCC)       Relevant Medications   donepezil (ARICEPT) 10 MG tablet      Will start donepezil and see if it helps with his memory.  We will check blood work.  No other change in medication.  Patient is already seen podiatry for his foot so he will continue to see them for it and they recommended some new shoes. Follow up plan: Return in about 6 months (around 05/19/2020), or if symptoms worsen or fail to improve, for Follow-up GERD and hyperlipidemia and Alzheimer's.  Counseling provided for all of the vaccine  components Orders Placed This Encounter  Procedures  . Flu Vaccine QUAD 6+ mos PF IM (Fluarix Quad PF)  . CBC with Differential/Platelet  . CMP14+EGFR  . Lipid panel    Caryl Pina, MD [facility] 11/20/2019, 11:45 AM

## 2019-11-21 LAB — CBC WITH DIFFERENTIAL/PLATELET
Basophils Absolute: 0.1 10*3/uL (ref 0.0–0.2)
Basos: 1 %
EOS (ABSOLUTE): 0.1 10*3/uL (ref 0.0–0.4)
Eos: 2 %
Hematocrit: 46.1 % (ref 37.5–51.0)
Hemoglobin: 15.5 g/dL (ref 13.0–17.7)
Immature Grans (Abs): 0 10*3/uL (ref 0.0–0.1)
Immature Granulocytes: 0 %
Lymphocytes Absolute: 1 10*3/uL (ref 0.7–3.1)
Lymphs: 15 %
MCH: 33.9 pg — ABNORMAL HIGH (ref 26.6–33.0)
MCHC: 33.6 g/dL (ref 31.5–35.7)
MCV: 101 fL — ABNORMAL HIGH (ref 79–97)
Monocytes Absolute: 0.7 10*3/uL (ref 0.1–0.9)
Monocytes: 10 %
Neutrophils Absolute: 5 10*3/uL (ref 1.4–7.0)
Neutrophils: 72 %
Platelets: 245 10*3/uL (ref 150–450)
RBC: 4.57 x10E6/uL (ref 4.14–5.80)
RDW: 13.3 % (ref 11.6–15.4)
WBC: 6.9 10*3/uL (ref 3.4–10.8)

## 2019-11-21 LAB — CMP14+EGFR
ALT: 12 IU/L (ref 0–44)
AST: 15 IU/L (ref 0–40)
Albumin/Globulin Ratio: 1.1 — ABNORMAL LOW (ref 1.2–2.2)
Albumin: 3.9 g/dL (ref 3.8–4.9)
Alkaline Phosphatase: 81 IU/L (ref 44–121)
BUN/Creatinine Ratio: 17 (ref 10–24)
BUN: 27 mg/dL (ref 8–27)
Bilirubin Total: 0.4 mg/dL (ref 0.0–1.2)
CO2: 26 mmol/L (ref 20–29)
Calcium: 9 mg/dL (ref 8.6–10.2)
Chloride: 101 mmol/L (ref 96–106)
Creatinine, Ser: 1.62 mg/dL — ABNORMAL HIGH (ref 0.76–1.27)
GFR calc Af Amer: 53 mL/min/{1.73_m2} — ABNORMAL LOW (ref >59)
GFR calc non Af Amer: 45 mL/min/{1.73_m2} — ABNORMAL LOW (ref >59)
Globulin, Total: 3.4 g/dL (ref 1.5–4.5)
Glucose: 82 mg/dL (ref 65–99)
Potassium: 5.6 mmol/L — ABNORMAL HIGH (ref 3.5–5.2)
Sodium: 140 mmol/L (ref 134–144)
Total Protein: 7.3 g/dL (ref 6.0–8.5)

## 2019-11-21 LAB — LIPID PANEL
Chol/HDL Ratio: 2.8 ratio (ref 0.0–5.0)
Cholesterol, Total: 151 mg/dL (ref 100–199)
HDL: 53 mg/dL (ref >39)
LDL Chol Calc (NIH): 84 mg/dL (ref 0–99)
Triglycerides: 69 mg/dL (ref 0–149)
VLDL Cholesterol Cal: 14 mg/dL (ref 5–40)

## 2019-11-25 ENCOUNTER — Other Ambulatory Visit: Payer: Self-pay | Admitting: *Deleted

## 2019-11-25 DIAGNOSIS — E782 Mixed hyperlipidemia: Secondary | ICD-10-CM

## 2019-11-25 MED ORDER — SIMVASTATIN 40 MG PO TABS: ORAL_TABLET | ORAL | 1 refills | Status: DC

## 2019-11-28 ENCOUNTER — Telehealth: Payer: Self-pay | Admitting: *Deleted

## 2019-11-28 ENCOUNTER — Telehealth: Payer: Self-pay | Admitting: Family Medicine

## 2019-11-28 DIAGNOSIS — R0789 Other chest pain: Secondary | ICD-10-CM | POA: Diagnosis not present

## 2019-11-28 DIAGNOSIS — R079 Chest pain, unspecified: Secondary | ICD-10-CM | POA: Diagnosis not present

## 2019-11-28 NOTE — Telephone Encounter (Signed)
Frank Levine states that Frank Levine has had some trouble sleeping and crying spells since put on Aricept a week ago. Last night he started complaining of chest pain and woke up vomiting this morning. Advised ER for eval of chest pain and vomiting. She is agreeable.

## 2019-11-28 NOTE — Telephone Encounter (Signed)
Since pt started Aricept he has been falling, crying. Seems more depressed. Pt c/o nausea as well. Medication was keeping him up at night so Butch Penny switched to giving it to patient in the am. Pt began having chest pains. EMS was called. All tests were normal per Butch Penny.    Wants to know if she can d/c medication?

## 2019-11-28 NOTE — Telephone Encounter (Signed)
Butch Penny informed and will call back when ready to discuss other treatment options.

## 2019-11-28 NOTE — Telephone Encounter (Signed)
Yes go ahead and discontinue the medication and we can talk about other options in the future.  We will wait till this gets fully out of his system before we try anything different

## 2019-11-28 NOTE — Telephone Encounter (Signed)
Butch Penny called requesting to speak with nurse regarding pts medication. Did not want to go in to detail with me about it.

## 2019-12-02 ENCOUNTER — Ambulatory Visit (INDEPENDENT_AMBULATORY_CARE_PROVIDER_SITE_OTHER): Payer: Medicare Other

## 2019-12-02 ENCOUNTER — Encounter: Payer: Self-pay | Admitting: Emergency Medicine

## 2019-12-02 ENCOUNTER — Other Ambulatory Visit: Payer: Self-pay

## 2019-12-02 ENCOUNTER — Ambulatory Visit
Admission: EM | Admit: 2019-12-02 | Discharge: 2019-12-02 | Disposition: A | Discharge status: Home / Self Care | Payer: Medicare Other | Attending: Emergency Medicine | Admitting: Emergency Medicine

## 2019-12-02 DIAGNOSIS — M25532 Pain in left wrist: Secondary | ICD-10-CM

## 2019-12-02 DIAGNOSIS — M79632 Pain in left forearm: Secondary | ICD-10-CM

## 2019-12-02 DIAGNOSIS — M7989 Other specified soft tissue disorders: Secondary | ICD-10-CM | POA: Diagnosis not present

## 2019-12-02 DIAGNOSIS — M79642 Pain in left hand: Secondary | ICD-10-CM

## 2019-12-02 DIAGNOSIS — R9389 Abnormal findings on diagnostic imaging of other specified body structures: Secondary | ICD-10-CM

## 2019-12-02 DIAGNOSIS — M25642 Stiffness of left hand, not elsewhere classified: Secondary | ICD-10-CM | POA: Diagnosis not present

## 2019-12-02 DIAGNOSIS — M19042 Primary osteoarthritis, left hand: Secondary | ICD-10-CM | POA: Diagnosis not present

## 2019-12-02 NOTE — ED Provider Notes (Signed)
[facility]   [id number] 12/02/19 Arrival Time: 5188  CC: LT wrist PAIN  SUBJECTIVE: History from: patient. Frank Levine is a 59 y.o. male complains of LT wrist and hand pain x 4 days.  Fall on outstretched hand.  Localizes the pain to the outside of wrist.  Describes the pain as hurts sometimes.  Has tried OTC medications and icing with relief.  Symptoms are made worse with ROM about the wrist.  Reports previous break, unsure if it was LT or RT hand.  Complains of swelling and erythema.  Denies fever, chills, ecchymosis, weakness, numbness and tingling.    ROS: As per HPI.  All other pertinent ROS negative.     Past Medical History:  Diagnosis Date   Anxiety    Arthritis    Bilateral hydronephrosis    BPH (benign prostatic hypertrophy)    Chronic cystitis    Chronic urethral stricture    urologist-  dr Risa Grill; has suprapubic catheter   CKD (chronic kidney disease), stage II    Down's syndrome 09/27/2011   GERD (gastroesophageal reflux disease)    OCCASIONAL   History of DVT of lower extremity    POST GU SURGERY  2013  &  2011   History of Meckel's diverticulum    BLADDER   History of pulmonary embolus (PE)    DEC 2011  [place] GU SURG[place] --  [place]  FOR 6 MONTHS   Hyperlipemia    Mental retardation    Neurogenic bladder    Wears glasses    Past Surgical History:  Procedure Laterality Date   BALLOON DILATION N/A 06/03/2015   Procedure: BALLOON DILATION;  Surgeon: Rana Snare, MD;  Location: [facility];  Service: Urology;  Laterality: N/A;   CYSTO/  BALLOON DILATION URETHRAL STRICTURE/  REMOVAL & REPLACEMENT RIGHT URETERAL STETN/  RIGHT RETROGRADE PYELOGRAM/   LEFT URETEROSCOPY/  ATTEMPTED REMOVAL LEFT STENT  05-10-2010   CYSTO/  BALLOON DILATION URETHRAL STRICTURE/  URETEROSCOPY/ REMOVAL LEFT STENT  10-17-2010   CYSTO/  BALLOON DILATTION URETHRAL STRICTURE/ BILATERAL RETROGRADE PYELOGRAM/ BILATERAL URETERAL STENTS/ BLADDER BX/  LEFT  URETEROSCOPY  01-27-2010   CYSTOGRAM N/A 07/10/2013   Procedure: CYSTOGRAM;  Surgeon: Bernestine Amass, MD;  Location: [facility];  Service: Urology;  Laterality: N/A;   CYSTOGRAM N/A 07/02/2014   Procedure: CYSTOGRAM;  Surgeon: Rana Snare, MD;  Location: [facility];  Service: Urology;  Laterality: N/A;   CYSTOSCOPY W/ RETROGRADES Right 06/03/2015   Procedure: CYSTOSCOPY WITH RIGHT RETROGRADE PYELOGRAM CYSTOGRAM;  Surgeon: Rana Snare, MD;  Location: [facility];  Service: Urology;  Laterality: Right;   CYSTOSCOPY WITH URETHRAL DILATATION N/A 07/10/2013   Procedure: CYSTOSCOPY WITH URETHRAL DILATATION, BALLOON DILITATION, RIGHT  RETROGRADE;  Surgeon: Bernestine Amass, MD;  Location: [facility];  Service: Urology;  Laterality: N/A;   CYSTOSCOPY WITH URETHRAL DILATATION N/A 07/02/2014   Procedure: CYSTOSCOPY WITH URETHRAL DILATATION;  Surgeon: Rana Snare, MD;  Location[facility];  Service: Urology;  Laterality: N/A;   INSERTION OF SUPRAPUBIC CATHETER N/A 06/03/2015   Procedure: INSERTION OF SUPRAPUBIC CATHETER;  Surgeon: Rana Snare, MD;  Location: [facility];  Service: Urology;  Laterality: N/A;   VENTRAL HERNIA REPAIR N/A 12/26/2016   Procedure: HERNIA REPAIR VENTRAL ADULT;  Surgeon: Aviva Signs, MD;  Location: [facility];  Service: General;  Laterality: N/A;   No Known Allergies No current facility-administered medications on file prior to encounter.  Current Outpatient Medications on File Prior to Encounter  Medication Sig Dispense Refill   acetaminophen (TYLENOL) 325 MG tablet Take 650 mg by mouth every 6 (six) hours as needed.     aspirin EC 81 MG tablet Take 81 mg by mouth daily.      cholecalciferol (VITAMIN D3) 25 MCG (1000 UNIT) tablet Take 1,000 Units by mouth daily.     clotrimazole (LOTRIMIN) 1 % cream APPLY ONCE TO TWICE DAILY TO BOTTOMS OF FEET AND BETWEEN TOES      clotrimazole-betamethasone (LOTRISONE) cream APPLY TO AFFECTED SKIN ONCE DAILY FOR NO LONGER THAN 7 CONSECUTIVE DAYS     docusate sodium (COLACE) 100 MG capsule Take 100 mg by mouth daily as needed.      donepezil (ARICEPT) 10 MG tablet Take 1 tablet (10 mg total) by mouth at bedtime. 90 tablet 3   pantoprazole (PROTONIX) 40 MG tablet TAKE 1 TABLET BY MOUTH EVERY DAY IN THE MORNING 90 tablet 1   simvastatin (ZOCOR) 40 MG tablet TAKE 1 TABLET BY MOUTH ONCE DAILY AT 6PM 90 tablet 1   Social History   Socioeconomic History   Marital status: Single    Spouse name: Not on file   Number of children: Not on file   Years of education: 12   Highest education level: 10th grade  Occupational History   Occupation: part-time Glass blower/designer at [employer]   Smoking status: Never Smoker   Smokeless tobacco: Never Used  Scientific laboratory technician Use: Never used  Substance and Sexual Activity   Alcohol use: No   Drug use: No   Sexual activity: Never  Other Topics Concern   Not on file  Social History Narrative   Patient is disabled and lives at home with his mother in a one story home. He has a part time job as Archivist and meets with a specials needs group once a month at the local recreation department. He has a 4 wheeler that he enjoys riding at home.    Social Determinants of Health   Financial Resource Strain:    Difficulty of Paying Living Expenses: Not on file  Food Insecurity:    Worried About Charity fundraiser in the Last Year: Not on file   [employer] of Food in the Last Year: Not on file  Transportation Needs:    Lack of Transportation (Medical): Not on file   Lack of Transportation (Non-Medical): Not on file  Physical Activity:    Days of Exercise per Week: Not on file   Minutes of Exercise per Session: Not on file  Stress:    Feeling of Stress : Not on file  Social Connections:    Frequency of Communication with Friends and Family: Not on file   Frequency  of Social Gatherings with Friends and Family: Not on file   Attends Religious Services: Not on file   Active Member of Clubs or Organizations: Not on file   Attends Archivist Meetings: Not on file   Marital Status: Not on file  Intimate Partner Violence:    Fear of Current or Ex-Partner: Not on file   Emotionally Abused: Not on file   Physically Abused: Not on file   Sexually Abused: Not on file   Family History  Problem Relation Age of Onset   Hypertension Mother    Hyperlipidemia Mother    Cancer Father     OBJECTIVE:  Vitals:   12/02/19 1203 12/02/19 1204  BP: 111/74   Pulse: (!) 106   Resp: 19   Temp: 98.2 F (36.8 C)   TempSrc: Oral   SpO2: 97%   Weight:  175 lb 14.8 oz (79.8 kg)  Height:  5' (1.524 m)    General appearance: ALERT; in no acute distress.  Head: NCAT Lungs: Normal respiratory effort CV: Radial pulse 2+; cap refill < 2 seconds Musculoskeletal: LT wrist Inspection: Swelling and erythema to LT wrist Palpation: TTP over lateral dorsal aspect of wrist ROM: LROM Strength: deferred Skin: warm and dry Neurologic: Ambulates without difficulty Psychological: alert and cooperative; normal mood and affect  DIAGNOSTIC STUDIES:  DG Forearm Left  Result Date: 12/02/2019 CLINICAL DATA:  Prior fall.  Left hand and forearm pain. EXAM: LEFT FOREARM - 2 VIEW COMPARISON:  No recent. FINDINGS: No evidence of effusion. No acute bony abnormality. Degenerative change noted about the left wrist and elbow. IMPRESSION: No acute abnormality. Degenerative change noted about the left wrist and elbow. Electronically Signed   By: Marcello Moores  Register   On: 12/02/2019 12:31   DG Hand Complete Left  Result Date: 12/02/2019 CLINICAL DATA:  [place] a few days ago. Persistent hand pain and swelling EXAM: LEFT HAND - COMPLETE 3+ VIEW COMPARISON:  None FINDINGS: Mild/moderate degenerative changes involving the wrist and hand. Suspect scattered erosions. No acute  fracture is identified. Moderate dorsal soft tissue swelling noted over the hand. IMPRESSION: 1. Degenerative changes and suspected scattered erosions but no acute fracture. 2. Dorsal soft tissue swelling. Electronically Signed   By: Marijo Sanes M.D.   On: 12/02/2019 12:36    X-rays negative for bony abnormalities including fracture, or dislocation.    I have reviewed the x-rays myself and the radiologist interpretation. I am in agreement with the radiologist interpretation.     ASSESSMENT & PLAN:  1. Left wrist pain   2. Left hand pain   3. Abnormal x-ray    No acute fracture or dislocation Continue conservative management of rest, ice, and elevation Ace wrap applied Continue to use tylenol as needed for pain Follow up with orthopedist for further evaluation and management  Return or go to the ER if you have any new or worsening symptoms (fever, chills, chest pain, redness, swelling, bruising, deformity, etc...)   Reviewed expectations re: course of current medical issues. Questions answered. Outlined signs and symptoms indicating need for more acute intervention. Patient verbalized understanding. After Visit Summary given.    Lestine Box, PA-C 12/02/19 1258

## 2019-12-02 NOTE — Discharge Instructions (Signed)
No acute fracture or dislocation Continue conservative management of rest, ice, and elevation Ace wrap applied Continue to use tylenol as needed for pain Follow up with orthopedist for further evaluation and management  Return or go to the ER if you have any new or worsening symptoms (fever, chills, chest pain, redness, swelling, bruising, deformity, etc...)

## 2019-12-02 NOTE — ED Triage Notes (Signed)
Pt tripped and fell on Thursday morning. LT forearm and fingers are swollen and painful.

## 2019-12-10 ENCOUNTER — Ambulatory Visit: Payer: Medicare Other | Admitting: Orthopaedic Surgery

## 2019-12-10 ENCOUNTER — Encounter: Payer: Self-pay | Admitting: Orthopaedic Surgery

## 2019-12-18 DIAGNOSIS — Z466 Encounter for fitting and adjustment of urinary device: Secondary | ICD-10-CM | POA: Diagnosis not present

## 2019-12-18 DIAGNOSIS — N131 Hydronephrosis with ureteral stricture, not elsewhere classified: Secondary | ICD-10-CM | POA: Diagnosis not present

## 2019-12-18 DIAGNOSIS — R338 Other retention of urine: Secondary | ICD-10-CM | POA: Diagnosis not present

## 2019-12-18 DIAGNOSIS — Z435 Encounter for attention to cystostomy: Secondary | ICD-10-CM | POA: Diagnosis not present

## 2019-12-30 DIAGNOSIS — M79642 Pain in left hand: Secondary | ICD-10-CM | POA: Diagnosis not present

## 2020-01-07 DIAGNOSIS — M79676 Pain in unspecified toe(s): Secondary | ICD-10-CM | POA: Diagnosis not present

## 2020-01-07 DIAGNOSIS — B351 Tinea unguium: Secondary | ICD-10-CM | POA: Diagnosis not present

## 2020-01-23 DIAGNOSIS — R338 Other retention of urine: Secondary | ICD-10-CM | POA: Diagnosis not present

## 2020-01-23 DIAGNOSIS — Z466 Encounter for fitting and adjustment of urinary device: Secondary | ICD-10-CM | POA: Diagnosis not present

## 2020-01-23 DIAGNOSIS — Z435 Encounter for attention to cystostomy: Secondary | ICD-10-CM | POA: Diagnosis not present

## 2020-01-23 DIAGNOSIS — N131 Hydronephrosis with ureteral stricture, not elsewhere classified: Secondary | ICD-10-CM | POA: Diagnosis not present

## 2020-02-05 ENCOUNTER — Other Ambulatory Visit: Payer: Self-pay

## 2020-02-05 ENCOUNTER — Other Ambulatory Visit: Payer: Medicare Other

## 2020-02-05 DIAGNOSIS — E875 Hyperkalemia: Secondary | ICD-10-CM | POA: Diagnosis not present

## 2020-02-05 DIAGNOSIS — N1832 Chronic kidney disease, stage 3b: Secondary | ICD-10-CM | POA: Diagnosis not present

## 2020-02-05 DIAGNOSIS — I129 Hypertensive chronic kidney disease with stage 1 through stage 4 chronic kidney disease, or unspecified chronic kidney disease: Secondary | ICD-10-CM | POA: Diagnosis not present

## 2020-02-05 DIAGNOSIS — R809 Proteinuria, unspecified: Secondary | ICD-10-CM | POA: Diagnosis not present

## 2020-02-11 DIAGNOSIS — L570 Actinic keratosis: Secondary | ICD-10-CM | POA: Diagnosis not present

## 2020-02-12 DIAGNOSIS — R809 Proteinuria, unspecified: Secondary | ICD-10-CM | POA: Diagnosis not present

## 2020-02-12 DIAGNOSIS — I950 Idiopathic hypotension: Secondary | ICD-10-CM | POA: Diagnosis not present

## 2020-02-12 DIAGNOSIS — N1832 Chronic kidney disease, stage 3b: Secondary | ICD-10-CM | POA: Diagnosis not present

## 2020-02-19 DIAGNOSIS — Z435 Encounter for attention to cystostomy: Secondary | ICD-10-CM | POA: Diagnosis not present

## 2020-02-19 DIAGNOSIS — R338 Other retention of urine: Secondary | ICD-10-CM | POA: Diagnosis not present

## 2020-02-19 DIAGNOSIS — Z466 Encounter for fitting and adjustment of urinary device: Secondary | ICD-10-CM | POA: Diagnosis not present

## 2020-02-19 DIAGNOSIS — N131 Hydronephrosis with ureteral stricture, not elsewhere classified: Secondary | ICD-10-CM | POA: Diagnosis not present

## 2020-02-20 DIAGNOSIS — R338 Other retention of urine: Secondary | ICD-10-CM | POA: Diagnosis not present

## 2020-02-27 ENCOUNTER — Other Ambulatory Visit: Payer: Medicare Other

## 2020-02-27 DIAGNOSIS — Z20822 Contact with and (suspected) exposure to covid-19: Secondary | ICD-10-CM

## 2020-03-01 LAB — NOVEL CORONAVIRUS, NAA: SARS-CoV-2, NAA: NOT DETECTED

## 2020-03-18 DIAGNOSIS — R338 Other retention of urine: Secondary | ICD-10-CM | POA: Diagnosis not present

## 2020-03-18 DIAGNOSIS — Z435 Encounter for attention to cystostomy: Secondary | ICD-10-CM | POA: Diagnosis not present

## 2020-03-18 DIAGNOSIS — N131 Hydronephrosis with ureteral stricture, not elsewhere classified: Secondary | ICD-10-CM | POA: Diagnosis not present

## 2020-03-18 DIAGNOSIS — Z466 Encounter for fitting and adjustment of urinary device: Secondary | ICD-10-CM | POA: Diagnosis not present

## 2020-03-26 DIAGNOSIS — Z466 Encounter for fitting and adjustment of urinary device: Secondary | ICD-10-CM | POA: Diagnosis not present

## 2020-03-26 DIAGNOSIS — N131 Hydronephrosis with ureteral stricture, not elsewhere classified: Secondary | ICD-10-CM | POA: Diagnosis not present

## 2020-03-26 DIAGNOSIS — R338 Other retention of urine: Secondary | ICD-10-CM | POA: Diagnosis not present

## 2020-03-26 DIAGNOSIS — Z435 Encounter for attention to cystostomy: Secondary | ICD-10-CM | POA: Diagnosis not present

## 2020-03-31 DIAGNOSIS — R338 Other retention of urine: Secondary | ICD-10-CM | POA: Diagnosis not present

## 2020-04-06 DIAGNOSIS — M11261 Other chondrocalcinosis, right knee: Secondary | ICD-10-CM | POA: Diagnosis not present

## 2020-04-06 DIAGNOSIS — M1711 Unilateral primary osteoarthritis, right knee: Secondary | ICD-10-CM | POA: Diagnosis not present

## 2020-04-09 DIAGNOSIS — Z435 Encounter for attention to cystostomy: Secondary | ICD-10-CM | POA: Diagnosis not present

## 2020-04-09 DIAGNOSIS — R338 Other retention of urine: Secondary | ICD-10-CM | POA: Diagnosis not present

## 2020-04-09 DIAGNOSIS — N131 Hydronephrosis with ureteral stricture, not elsewhere classified: Secondary | ICD-10-CM | POA: Diagnosis not present

## 2020-04-09 DIAGNOSIS — Z466 Encounter for fitting and adjustment of urinary device: Secondary | ICD-10-CM | POA: Diagnosis not present

## 2020-04-10 ENCOUNTER — Encounter: Payer: Self-pay | Admitting: Family

## 2020-04-10 ENCOUNTER — Ambulatory Visit (INDEPENDENT_AMBULATORY_CARE_PROVIDER_SITE_OTHER): Payer: Medicare Other | Admitting: Family

## 2020-04-10 DIAGNOSIS — Q909 Down syndrome, unspecified: Secondary | ICD-10-CM

## 2020-04-10 DIAGNOSIS — R8290 Unspecified abnormal findings in urine: Secondary | ICD-10-CM

## 2020-04-10 DIAGNOSIS — Z9359 Other cystostomy status: Secondary | ICD-10-CM | POA: Diagnosis not present

## 2020-04-10 DIAGNOSIS — R399 Unspecified symptoms and signs involving the genitourinary system: Secondary | ICD-10-CM

## 2020-04-10 LAB — URINALYSIS, COMPLETE
Bilirubin, UA: NEGATIVE
Glucose, UA: NEGATIVE
Ketones, UA: NEGATIVE
Nitrite, UA: NEGATIVE
Specific Gravity, UA: 1.025 (ref 1.005–1.030)
Urobilinogen, Ur: 0.2 mg/dL (ref 0.2–1.0)
pH, UA: 7 (ref 5.0–7.5)

## 2020-04-10 LAB — MICROSCOPIC EXAMINATION

## 2020-04-10 MED ORDER — AMOXICILLIN-POT CLAVULANATE 875-125 MG PO TABS: 1.0000 | ORAL_TABLET | Freq: Two times a day (BID) | ORAL | 0 refills | Status: DC

## 2020-04-10 NOTE — Progress Notes (Signed)
   Virtual Visit via telephone Note Due to COVID-19 pandemic this visit was conducted virtually. This visit type was conducted due to national recommendations for restrictions regarding the COVID-19 Pandemic (e.g. social distancing, sheltering in place) in an effort to limit this patient's exposure and mitigate transmission in our community. All issues noted in this document were discussed and addressed.  A physical exam was not performed with this format.  I connected with Frank Levine's mother  on 04/10/20 at 3:15 pm  by telephone and verified that I am speaking with the correct person using two identifiers. Frank Levine is currently located at home and mother is currently with him during visit. The provider, Evelina Dun, FNP is located in their office at time of visit.  I discussed the limitations, risks, security and privacy concerns of performing an evaluation and management service by telephone and the availability of in person appointments. I also discussed with the patient that there may be a patient responsible charge related to this service. The patient expressed understanding and agreed to proceed.   History and Present Illness:  Caregiver calls the office today with foul urine. He has a subpubic cathter. He has dementia, but has seem more confused over the last day. He has down syndrome. He is followed by Urologists and Nephrologists.  Urinary Tract Infection  This is a new problem. The current episode started in the past 7 days. The problem has been waxing and waning. The patient is experiencing no pain. Pertinent negatives include no flank pain, frequency, hematuria, nausea or vomiting.      Review of Systems  Gastrointestinal: Negative for nausea and vomiting.  Genitourinary: Negative for flank pain, frequency and hematuria.     Observations/Objective: No SOB or distress noted   Assessment and Plan: 1. Foul smelling urine - Urinalysis, Complete - Urine Culture -  amoxicillin-clavulanate (AUGMENTIN) 875-125 MG tablet; Take 1 tablet by mouth 2 (two) times daily.  Dispense: 14 tablet; Refill: 0  2. UTI symptoms - Urinalysis, Complete - Urine Culture - amoxicillin-clavulanate (AUGMENTIN) 875-125 MG tablet; Take 1 tablet by mouth 2 (two) times daily.  Dispense: 14 tablet; Refill: 0  3. Down's syndrome  4. Suprapubic catheter ([place])   Force fluids RTO if symptoms worsen or do not improve  Culture pending     I discussed the assessment and treatment plan with the patient. The patient was provided an opportunity to ask questions and all were answered. The patient agreed with the plan and demonstrated an understanding of the instructions.   The patient was advised to call back or seek an in-person evaluation if the symptoms worsen or if the condition fails to improve as anticipated.  The above assessment and management plan was discussed with the patient. The patient verbalized understanding of and has agreed to the management plan. Patient is aware to call the clinic if symptoms persist or worsen. Patient is aware when to return to the clinic for a follow-up visit. Patient educated on when it is appropriate to go to the emergency department.   Time call ended:  3:26 pm   I provided 11 minutes of non-face-to-face time during this encounter.    Evelina Dun, FNP

## 2020-04-12 LAB — URINE CULTURE

## 2020-04-17 DIAGNOSIS — Z435 Encounter for attention to cystostomy: Secondary | ICD-10-CM | POA: Diagnosis not present

## 2020-04-17 DIAGNOSIS — R338 Other retention of urine: Secondary | ICD-10-CM | POA: Diagnosis not present

## 2020-04-17 DIAGNOSIS — N131 Hydronephrosis with ureteral stricture, not elsewhere classified: Secondary | ICD-10-CM | POA: Diagnosis not present

## 2020-04-17 DIAGNOSIS — Z466 Encounter for fitting and adjustment of urinary device: Secondary | ICD-10-CM | POA: Diagnosis not present

## 2020-04-20 ENCOUNTER — Other Ambulatory Visit: Payer: Self-pay | Admitting: Family Medicine

## 2020-04-20 DIAGNOSIS — K219 Gastro-esophageal reflux disease without esophagitis: Secondary | ICD-10-CM

## 2020-04-20 DIAGNOSIS — M11261 Other chondrocalcinosis, right knee: Secondary | ICD-10-CM | POA: Diagnosis not present

## 2020-04-22 DIAGNOSIS — R338 Other retention of urine: Secondary | ICD-10-CM | POA: Diagnosis not present

## 2020-04-23 DIAGNOSIS — R338 Other retention of urine: Secondary | ICD-10-CM | POA: Diagnosis not present

## 2020-04-23 DIAGNOSIS — N131 Hydronephrosis with ureteral stricture, not elsewhere classified: Secondary | ICD-10-CM | POA: Diagnosis not present

## 2020-04-23 DIAGNOSIS — Z435 Encounter for attention to cystostomy: Secondary | ICD-10-CM | POA: Diagnosis not present

## 2020-04-23 DIAGNOSIS — Z466 Encounter for fitting and adjustment of urinary device: Secondary | ICD-10-CM | POA: Diagnosis not present

## 2020-04-27 DIAGNOSIS — R338 Other retention of urine: Secondary | ICD-10-CM | POA: Diagnosis not present

## 2020-04-27 DIAGNOSIS — N131 Hydronephrosis with ureteral stricture, not elsewhere classified: Secondary | ICD-10-CM | POA: Diagnosis not present

## 2020-04-27 DIAGNOSIS — Z435 Encounter for attention to cystostomy: Secondary | ICD-10-CM | POA: Diagnosis not present

## 2020-04-27 DIAGNOSIS — Z466 Encounter for fitting and adjustment of urinary device: Secondary | ICD-10-CM | POA: Diagnosis not present

## 2020-04-29 DIAGNOSIS — N131 Hydronephrosis with ureteral stricture, not elsewhere classified: Secondary | ICD-10-CM | POA: Diagnosis not present

## 2020-04-29 DIAGNOSIS — Z435 Encounter for attention to cystostomy: Secondary | ICD-10-CM | POA: Diagnosis not present

## 2020-04-29 DIAGNOSIS — R338 Other retention of urine: Secondary | ICD-10-CM | POA: Diagnosis not present

## 2020-04-29 DIAGNOSIS — Z466 Encounter for fitting and adjustment of urinary device: Secondary | ICD-10-CM | POA: Diagnosis not present

## 2020-05-07 ENCOUNTER — Other Ambulatory Visit: Payer: Medicare Other

## 2020-05-07 DIAGNOSIS — R809 Proteinuria, unspecified: Secondary | ICD-10-CM | POA: Diagnosis not present

## 2020-05-07 DIAGNOSIS — I950 Idiopathic hypotension: Secondary | ICD-10-CM | POA: Diagnosis not present

## 2020-05-07 DIAGNOSIS — N1832 Chronic kidney disease, stage 3b: Secondary | ICD-10-CM | POA: Diagnosis not present

## 2020-05-08 DIAGNOSIS — N131 Hydronephrosis with ureteral stricture, not elsewhere classified: Secondary | ICD-10-CM | POA: Diagnosis not present

## 2020-05-08 DIAGNOSIS — R338 Other retention of urine: Secondary | ICD-10-CM | POA: Diagnosis not present

## 2020-05-08 DIAGNOSIS — Z466 Encounter for fitting and adjustment of urinary device: Secondary | ICD-10-CM | POA: Diagnosis not present

## 2020-05-08 DIAGNOSIS — Z435 Encounter for attention to cystostomy: Secondary | ICD-10-CM | POA: Diagnosis not present

## 2020-05-12 DIAGNOSIS — R338 Other retention of urine: Secondary | ICD-10-CM | POA: Diagnosis not present

## 2020-05-12 DIAGNOSIS — Z435 Encounter for attention to cystostomy: Secondary | ICD-10-CM | POA: Diagnosis not present

## 2020-05-12 DIAGNOSIS — N131 Hydronephrosis with ureteral stricture, not elsewhere classified: Secondary | ICD-10-CM | POA: Diagnosis not present

## 2020-05-12 DIAGNOSIS — Z466 Encounter for fitting and adjustment of urinary device: Secondary | ICD-10-CM | POA: Diagnosis not present

## 2020-05-13 DIAGNOSIS — R809 Proteinuria, unspecified: Secondary | ICD-10-CM | POA: Diagnosis not present

## 2020-05-13 DIAGNOSIS — N1832 Chronic kidney disease, stage 3b: Secondary | ICD-10-CM | POA: Diagnosis not present

## 2020-05-13 DIAGNOSIS — R338 Other retention of urine: Secondary | ICD-10-CM | POA: Diagnosis not present

## 2020-05-13 DIAGNOSIS — I950 Idiopathic hypotension: Secondary | ICD-10-CM | POA: Diagnosis not present

## 2020-05-13 DIAGNOSIS — E870 Hyperosmolality and hypernatremia: Secondary | ICD-10-CM | POA: Diagnosis not present

## 2020-05-13 DIAGNOSIS — I129 Hypertensive chronic kidney disease with stage 1 through stage 4 chronic kidney disease, or unspecified chronic kidney disease: Secondary | ICD-10-CM | POA: Diagnosis not present

## 2020-05-18 DIAGNOSIS — R338 Other retention of urine: Secondary | ICD-10-CM | POA: Diagnosis not present

## 2020-05-18 DIAGNOSIS — Z466 Encounter for fitting and adjustment of urinary device: Secondary | ICD-10-CM | POA: Diagnosis not present

## 2020-05-18 DIAGNOSIS — N131 Hydronephrosis with ureteral stricture, not elsewhere classified: Secondary | ICD-10-CM | POA: Diagnosis not present

## 2020-05-18 DIAGNOSIS — Z435 Encounter for attention to cystostomy: Secondary | ICD-10-CM | POA: Diagnosis not present

## 2020-05-19 DIAGNOSIS — N131 Hydronephrosis with ureteral stricture, not elsewhere classified: Secondary | ICD-10-CM | POA: Diagnosis not present

## 2020-05-19 DIAGNOSIS — R338 Other retention of urine: Secondary | ICD-10-CM | POA: Diagnosis not present

## 2020-05-19 DIAGNOSIS — Z435 Encounter for attention to cystostomy: Secondary | ICD-10-CM | POA: Diagnosis not present

## 2020-05-19 DIAGNOSIS — Z466 Encounter for fitting and adjustment of urinary device: Secondary | ICD-10-CM | POA: Diagnosis not present

## 2020-05-20 ENCOUNTER — Ambulatory Visit (INDEPENDENT_AMBULATORY_CARE_PROVIDER_SITE_OTHER): Payer: Medicare Other | Admitting: Family Medicine

## 2020-05-20 ENCOUNTER — Encounter: Payer: Self-pay | Admitting: Family Medicine

## 2020-05-20 ENCOUNTER — Other Ambulatory Visit: Payer: Self-pay

## 2020-05-20 VITALS — BP 97/63 | HR 98 | Ht 60.0 in | Wt 163.0 lb

## 2020-05-20 DIAGNOSIS — N183 Chronic kidney disease, stage 3 unspecified: Secondary | ICD-10-CM

## 2020-05-20 DIAGNOSIS — K219 Gastro-esophageal reflux disease without esophagitis: Secondary | ICD-10-CM | POA: Diagnosis not present

## 2020-05-20 DIAGNOSIS — E782 Mixed hyperlipidemia: Secondary | ICD-10-CM

## 2020-05-20 MED ORDER — OMEPRAZOLE 40 MG PO CPDR: 40.0000 mg | DELAYED_RELEASE_CAPSULE | Freq: Every day | ORAL | 3 refills | Status: DC

## 2020-05-20 MED ORDER — SIMVASTATIN 40 MG PO TABS: ORAL_TABLET | ORAL | 3 refills | Status: DC

## 2020-05-20 NOTE — Progress Notes (Signed)
BP 97/63   Pulse 98   Ht 5' (1.524 m)   Wt 163 lb (73.9 kg)   SpO2 99%   BMI 31.83 kg/m    Subjective:   Patient ID: Frank Levine, male    DOB: [birth year], 60 y.o.   MRN: [id number]  HPI: Frank Levine is a 60 y.o. male presenting on 05/20/2020 for Medical Management of Chronic Issues, Chronic Kidney Disease, and Hyperlipidemia   HPI Hyperlipidemia Patient is coming in for recheck of his hyperlipidemia. The patient is currently taking simvastatin. They deny any issues with myalgias or history of liver damage from it. They deny any focal numbness or weakness or chest pain.   CKD stage III, sees nephrology  GERD Patient is currently on omeprazole.  She denies any major symptoms or abdominal pain or belching or burping. She denies any blood in her stool or lightheadedness or dizziness.   Relevant past medical, surgical, family and social history reviewed and updated as indicated. Interim medical history since our last visit reviewed. Allergies and medications reviewed and updated.  Review of Systems  Constitutional: Negative for chills and fever.  Eyes: Negative for visual disturbance.  Respiratory: Negative for shortness of breath and wheezing.   Cardiovascular: Negative for chest pain and leg swelling.  Musculoskeletal: Negative for back pain and gait problem.  Skin: Negative for rash.  Neurological: Negative for dizziness, weakness and light-headedness.  All other systems reviewed and are negative.   Per HPI unless specifically indicated above   Allergies as of 05/20/2020   No Known Allergies     Medication List       Accurate as of May 20, 2020 10:42 AM. If you have any questions, ask your nurse or doctor.        STOP taking these medications   donepezil 10 MG tablet Commonly known as: Aricept Stopped by: Fransisca Kaufmann Dettinger, MD     TAKE these medications   acetaminophen 325 MG tablet Commonly known as: TYLENOL Take 650 mg by mouth every 6 (six) hours  as needed.   amoxicillin-clavulanate 875-125 MG tablet Commonly known as: AUGMENTIN Take 1 tablet by mouth 2 (two) times daily.   aspirin EC 81 MG tablet Take 81 mg by mouth daily.   cholecalciferol 25 MCG (1000 UNIT) tablet Commonly known as: VITAMIN D3 Take 1,000 Units by mouth daily.   clotrimazole 1 % cream Commonly known as: LOTRIMIN APPLY ONCE TO TWICE DAILY TO BOTTOMS OF FEET AND BETWEEN TOES   clotrimazole-betamethasone cream Commonly known as: LOTRISONE APPLY TO AFFECTED SKIN ONCE DAILY FOR NO LONGER THAN 7 CONSECUTIVE DAYS   docusate sodium 100 MG capsule Commonly known as: COLACE Take 100 mg by mouth daily as needed.   pantoprazole 40 MG tablet Commonly known as: PROTONIX TAKE (1) TABLET DAILY IN THE MORNING.   polyethylene glycol 17 g packet Commonly known as: MIRALAX / GLYCOLAX Take 17 g by mouth daily.   simvastatin 40 MG tablet Commonly known as: ZOCOR TAKE 1 TABLET BY MOUTH ONCE DAILY AT 6PM        Objective:   BP 97/63   Pulse 98   Ht 5' (1.524 m)   Wt 163 lb (73.9 kg)   SpO2 99%   BMI 31.83 kg/m   Wt Readings from Last 3 Encounters:  05/20/20 163 lb (73.9 kg)  12/02/19 175 lb 14.8 oz (79.8 kg)  11/20/19 176 lb (79.8 kg)    Physical Exam Vitals and nursing note reviewed.  Constitutional:      General: He is not in acute distress.    Appearance: He is well-developed. He is not diaphoretic.  Eyes:     General: No scleral icterus.    Conjunctiva/sclera: Conjunctivae normal.  Neck:     Thyroid: No thyromegaly.  Cardiovascular:     Rate and Rhythm: Normal rate and regular rhythm.     Heart sounds: Normal heart sounds. No murmur heard.   Pulmonary:     Effort: Pulmonary effort is normal. No respiratory distress.     Breath sounds: Normal breath sounds. No wheezing.  Abdominal:     General: Abdomen is flat. Bowel sounds are normal. There is no distension.     Tenderness: There is no abdominal tenderness. There is no guarding or  rebound.  Musculoskeletal:        General: Normal range of motion.     Right knee: Normal. No swelling, effusion or bony tenderness. Normal range of motion. No tenderness.     Left knee: Normal. No swelling, effusion or bony tenderness. Normal range of motion. No tenderness.  Skin:    General: Skin is warm and dry.     Findings: No rash.  Neurological:     Mental Status: He is alert and oriented to person, place, and time.     Coordination: Coordination normal.  Psychiatric:        Behavior: Behavior normal.       Assessment & Plan:   Problem List Items Addressed This Visit      Digestive   GERD (gastroesophageal reflux disease)   Relevant Medications   polyethylene glycol (MIRALAX / GLYCOLAX) 17 g packet   omeprazole (PRILOSEC) 40 MG capsule     Genitourinary   CKD (chronic kidney disease) stage 3, GFR 30-59 ml/min (HCC) - Primary (Chronic)     Other   Hyperlipidemia   Relevant Medications   simvastatin (ZOCOR) 40 MG tablet      Had blood work done 2 weeks ago with nephrology and it looks stable.   Patient has been having a lot more belching and burping she is concerned about acid reflux, will switch to omeprazole  Patient is doing therapy and seen orthopedic for his right knee pain. Follow up plan: Return in about 6 months (around 11/20/2020), or if symptoms worsen or fail to improve, for Hyperlipidemia and CKD.  Counseling provided for all of the vaccine components No orders of the defined types were placed in this encounter.   Caryl Pina, MD [facility] 05/20/2020, 10:42 AM

## 2020-05-25 DIAGNOSIS — R338 Other retention of urine: Secondary | ICD-10-CM | POA: Diagnosis not present

## 2020-05-25 DIAGNOSIS — N131 Hydronephrosis with ureteral stricture, not elsewhere classified: Secondary | ICD-10-CM | POA: Diagnosis not present

## 2020-05-25 DIAGNOSIS — Z435 Encounter for attention to cystostomy: Secondary | ICD-10-CM | POA: Diagnosis not present

## 2020-05-25 DIAGNOSIS — Z466 Encounter for fitting and adjustment of urinary device: Secondary | ICD-10-CM | POA: Diagnosis not present

## 2020-05-29 DIAGNOSIS — M25561 Pain in right knee: Secondary | ICD-10-CM | POA: Diagnosis not present

## 2020-05-31 DIAGNOSIS — H5213 Myopia, bilateral: Secondary | ICD-10-CM | POA: Diagnosis not present

## 2020-06-02 DIAGNOSIS — R338 Other retention of urine: Secondary | ICD-10-CM | POA: Diagnosis not present

## 2020-06-02 DIAGNOSIS — Z435 Encounter for attention to cystostomy: Secondary | ICD-10-CM | POA: Diagnosis not present

## 2020-06-02 DIAGNOSIS — Z466 Encounter for fitting and adjustment of urinary device: Secondary | ICD-10-CM | POA: Diagnosis not present

## 2020-06-02 DIAGNOSIS — N131 Hydronephrosis with ureteral stricture, not elsewhere classified: Secondary | ICD-10-CM | POA: Diagnosis not present

## 2020-06-15 DIAGNOSIS — M1711 Unilateral primary osteoarthritis, right knee: Secondary | ICD-10-CM | POA: Diagnosis not present

## 2020-06-15 DIAGNOSIS — S83281A Other tear of lateral meniscus, current injury, right knee, initial encounter: Secondary | ICD-10-CM | POA: Diagnosis not present

## 2020-06-15 DIAGNOSIS — S83241A Other tear of medial meniscus, current injury, right knee, initial encounter: Secondary | ICD-10-CM | POA: Diagnosis not present

## 2020-06-19 ENCOUNTER — Other Ambulatory Visit (INDEPENDENT_AMBULATORY_CARE_PROVIDER_SITE_OTHER): Payer: Medicare Other | Admitting: Nurse Practitioner

## 2020-06-19 DIAGNOSIS — Z435 Encounter for attention to cystostomy: Secondary | ICD-10-CM | POA: Diagnosis not present

## 2020-06-19 DIAGNOSIS — R338 Other retention of urine: Secondary | ICD-10-CM | POA: Diagnosis not present

## 2020-06-19 DIAGNOSIS — N131 Hydronephrosis with ureteral stricture, not elsewhere classified: Secondary | ICD-10-CM | POA: Diagnosis not present

## 2020-06-19 DIAGNOSIS — Z466 Encounter for fitting and adjustment of urinary device: Secondary | ICD-10-CM | POA: Diagnosis not present

## 2020-06-19 DIAGNOSIS — R21 Rash and other nonspecific skin eruption: Secondary | ICD-10-CM

## 2020-06-19 MED ORDER — NYSTATIN 100000 UNIT/GM EX POWD: 1.0000 "application " | Freq: Three times a day (TID) | CUTANEOUS | 1 refills | Status: DC

## 2020-06-19 MED ORDER — NYSTATIN 100000 UNIT/GM EX POWD: 1.0000 "application " | Freq: Three times a day (TID) | CUTANEOUS | 0 refills | Status: DC

## 2020-06-19 MED ORDER — CLOTRIMAZOLE-BETAMETHASONE 1-0.05 % EX CREA: TOPICAL_CREAM | CUTANEOUS | 0 refills | Status: DC

## 2020-06-19 MED ORDER — CLOTRIMAZOLE-BETAMETHASONE 1-0.05 % EX CREA: TOPICAL_CREAM | CUTANEOUS | 1 refills | Status: DC

## 2020-06-19 NOTE — Progress Notes (Signed)
   Virtual Visit  Note Due to COVID-19 pandemic this visit was conducted virtually. This visit type was conducted due to national recommendations for restrictions regarding the COVID-19 Pandemic (e.g. social distancing, sheltering in place) in an effort to limit this patient's exposure and mitigate transmission in our community. All issues noted in this document were discussed and addressed.  A physical exam was not performed with this format.  I connected with Frank Levine Nurse [facility] on 06/19/20 at 11:20 am  by telephone and verified that I am speaking with the correct person using two identifiers. Frank Levine is currently located at home and Frank Levine is currently with patient during visit. The provider, Ivy Lynn, NP is located in their office at time of visit.  I discussed the limitations, risks, security and privacy concerns of performing an evaluation and management service by telephone and the availability of in person appointments. I also discussed with the patient that there may be a patient responsible charge related to this service. The patient expressed understanding and agreed to proceed.   History and Present Illness:  HPI  Unresolved ongoing rash, home health nurse wanted and requested topical nystatin powder and lotrisone cream  Review of Systems  Skin: Positive for itching and rash.  All other systems reviewed and are negative.    Observations/Objective: Frank Levine patients home health nurse  says patient is not in any distress  Assessment and Plan:  Lotrisone and Nystatin powder Rx sent to pharmacy  Follow Up Instructions:   Follow up with worsening or unresolved symptoms    I discussed the assessment and treatment plan with the patient. The patient was provided an opportunity to ask questions and all were answered. The patient agreed with the plan and demonstrated an understanding of the instructions.   The patient was advised to call  back or seek an in-person evaluation if the symptoms worsen or if the condition fails to improve as anticipated.  The above assessment and management plan was discussed with the patient. The patient verbalized understanding of and has agreed to the management plan. Patient is aware to call the clinic if symptoms persist or worsen. Patient is aware when to return to the clinic for a follow-up visit. Patient educated on when it is appropriate to go to the emergency department.   Time call ended:  11:26 AM  I provided 6 minutes of  non face-to-face time during this encounter.    Ivy Lynn, NP

## 2020-06-22 ENCOUNTER — Telehealth: Payer: Self-pay

## 2020-06-22 DIAGNOSIS — R338 Other retention of urine: Secondary | ICD-10-CM | POA: Diagnosis not present

## 2020-06-22 NOTE — Telephone Encounter (Signed)
I would increase it to 2000 units daily

## 2020-06-22 NOTE — Telephone Encounter (Signed)
Dr. Warrick Parisian,  Pt has vitamin D3 on his med list. 1000 units qd. Is this alright?

## 2020-06-22 NOTE — Telephone Encounter (Signed)
Left message for Butch Penny informing that pt could take 2000 units daily. Instructed to call back with any concerns.

## 2020-07-22 DIAGNOSIS — Z466 Encounter for fitting and adjustment of urinary device: Secondary | ICD-10-CM | POA: Diagnosis not present

## 2020-07-22 DIAGNOSIS — R338 Other retention of urine: Secondary | ICD-10-CM | POA: Diagnosis not present

## 2020-07-22 DIAGNOSIS — N131 Hydronephrosis with ureteral stricture, not elsewhere classified: Secondary | ICD-10-CM | POA: Diagnosis not present

## 2020-07-22 DIAGNOSIS — Z435 Encounter for attention to cystostomy: Secondary | ICD-10-CM | POA: Diagnosis not present

## 2020-08-10 ENCOUNTER — Other Ambulatory Visit: Payer: Medicare Other

## 2020-08-10 ENCOUNTER — Other Ambulatory Visit: Payer: Self-pay

## 2020-08-12 ENCOUNTER — Other Ambulatory Visit: Payer: Self-pay

## 2020-08-12 ENCOUNTER — Other Ambulatory Visit: Payer: Medicare Other

## 2020-08-12 DIAGNOSIS — N1832 Chronic kidney disease, stage 3b: Secondary | ICD-10-CM | POA: Diagnosis not present

## 2020-08-12 DIAGNOSIS — Z79899 Other long term (current) drug therapy: Secondary | ICD-10-CM | POA: Diagnosis not present

## 2020-08-12 DIAGNOSIS — R809 Proteinuria, unspecified: Secondary | ICD-10-CM | POA: Diagnosis not present

## 2020-08-12 DIAGNOSIS — D631 Anemia in chronic kidney disease: Secondary | ICD-10-CM | POA: Diagnosis not present

## 2020-08-14 DIAGNOSIS — N1832 Chronic kidney disease, stage 3b: Secondary | ICD-10-CM | POA: Diagnosis not present

## 2020-08-14 DIAGNOSIS — E611 Iron deficiency: Secondary | ICD-10-CM | POA: Diagnosis not present

## 2020-08-14 DIAGNOSIS — R809 Proteinuria, unspecified: Secondary | ICD-10-CM | POA: Diagnosis not present

## 2020-08-14 DIAGNOSIS — I950 Idiopathic hypotension: Secondary | ICD-10-CM | POA: Diagnosis not present

## 2020-08-14 DIAGNOSIS — I129 Hypertensive chronic kidney disease with stage 1 through stage 4 chronic kidney disease, or unspecified chronic kidney disease: Secondary | ICD-10-CM | POA: Diagnosis not present

## 2020-08-17 DIAGNOSIS — Z466 Encounter for fitting and adjustment of urinary device: Secondary | ICD-10-CM | POA: Diagnosis not present

## 2020-08-17 DIAGNOSIS — Z435 Encounter for attention to cystostomy: Secondary | ICD-10-CM | POA: Diagnosis not present

## 2020-08-17 DIAGNOSIS — N131 Hydronephrosis with ureteral stricture, not elsewhere classified: Secondary | ICD-10-CM | POA: Diagnosis not present

## 2020-08-17 DIAGNOSIS — R338 Other retention of urine: Secondary | ICD-10-CM | POA: Diagnosis not present

## 2020-08-24 DIAGNOSIS — R8279 Other abnormal findings on microbiological examination of urine: Secondary | ICD-10-CM | POA: Diagnosis not present

## 2020-08-24 DIAGNOSIS — N130 Hydronephrosis with ureteropelvic junction obstruction: Secondary | ICD-10-CM | POA: Diagnosis not present

## 2020-08-24 DIAGNOSIS — R339 Retention of urine, unspecified: Secondary | ICD-10-CM | POA: Diagnosis not present

## 2020-08-26 ENCOUNTER — Encounter (INDEPENDENT_AMBULATORY_CARE_PROVIDER_SITE_OTHER): Payer: Self-pay | Admitting: *Deleted

## 2020-09-14 DIAGNOSIS — Z466 Encounter for fitting and adjustment of urinary device: Secondary | ICD-10-CM | POA: Diagnosis not present

## 2020-09-14 DIAGNOSIS — R338 Other retention of urine: Secondary | ICD-10-CM | POA: Diagnosis not present

## 2020-09-14 DIAGNOSIS — Z435 Encounter for attention to cystostomy: Secondary | ICD-10-CM | POA: Diagnosis not present

## 2020-09-14 DIAGNOSIS — N131 Hydronephrosis with ureteral stricture, not elsewhere classified: Secondary | ICD-10-CM | POA: Diagnosis not present

## 2020-09-15 DIAGNOSIS — R338 Other retention of urine: Secondary | ICD-10-CM | POA: Diagnosis not present

## 2020-09-21 ENCOUNTER — Ambulatory Visit (INDEPENDENT_AMBULATORY_CARE_PROVIDER_SITE_OTHER): Payer: Medicare Other | Admitting: Family

## 2020-09-21 ENCOUNTER — Encounter: Payer: Self-pay | Admitting: Family

## 2020-09-21 DIAGNOSIS — Q909 Down syndrome, unspecified: Secondary | ICD-10-CM | POA: Diagnosis not present

## 2020-09-21 DIAGNOSIS — R109 Unspecified abdominal pain: Secondary | ICD-10-CM

## 2020-09-21 DIAGNOSIS — R1084 Generalized abdominal pain: Secondary | ICD-10-CM

## 2020-09-21 LAB — MICROSCOPIC EXAMINATION: WBC, UA: >30 /hpf — AB (ref 0–5)

## 2020-09-21 LAB — URINALYSIS, COMPLETE
Bilirubin, UA: NEGATIVE
Glucose, UA: NEGATIVE
Ketones, UA: NEGATIVE
Nitrite, UA: POSITIVE — AB
Specific Gravity, UA: 1.015 (ref 1.005–1.030)
Urobilinogen, Ur: 0.2 mg/dL (ref 0.2–1.0)
pH, UA: >9 — ABNORMAL HIGH (ref 5.0–7.5)

## 2020-09-21 NOTE — Progress Notes (Signed)
   Virtual Visit  Note Due to COVID-19 pandemic this visit was conducted virtually. This visit type was conducted due to national recommendations for restrictions regarding the COVID-19 Pandemic (e.g. social distancing, sheltering in place) in an effort to limit this patient's exposure and mitigate transmission in our community. All issues noted in this document were discussed and addressed.  A physical exam was not performed with this format.  I connected with Frank Levine on 09/21/20 at 9:05 AM  by telephone and verified that I am speaking with the correct person using two identifiers. Frank Levine is currently located at home and with mother is currently with him  during visit. The provider, Evelina Dun, FNP is located in their office at time of visit.  I discussed the limitations, risks, security and privacy concerns of performing an evaluation and management service by telephone and the availability of in person appointments. I also discussed with the patient that there may be a patient responsible charge related to this service. The patient expressed understanding and agreed to proceed.   History and Present Illness:  HPI  Mother calls the office today requesting urine being tested. He has down syndrome and has an indwelling pubic cathter. He has been complaining of lower back pain and abdominal pain for two days. He is not the best historian and per mother he complains of some sort of ache or pain every morning. However, given he has the cathter she would like his urine to be checked just in case.   Denies any dysuria, fever, or changes in urine appearance.   Review of Systems  Gastrointestinal:  Positive for abdominal pain.  Genitourinary:  Positive for flank pain. Negative for frequency, hematuria and urgency.  All other systems reviewed and are negative.   Observations/Objective: No SOB or distress noted  Assessment and Plan: 1. Flank pain - Urinalysis, Complete - Urine  Culture  2. Generalized abdominal pain - Urinalysis, Complete - Urine Culture  Mother will bring sample today Urine pending  Force fluids  Keep follow up with Urologists   I discussed the assessment and treatment plan with the patient. The patient was provided an opportunity to ask questions and all were answered. The patient agreed with the plan and demonstrated an understanding of the instructions.   The patient was advised to call back or seek an in-person evaluation if the symptoms worsen or if the condition fails to improve as anticipated.  The above assessment and management plan was discussed with the patient. The patient verbalized understanding of and has agreed to the management plan. Patient is aware to call the clinic if symptoms persist or worsen. Patient is aware when to return to the clinic for a follow-up visit. Patient educated on when it is appropriate to go to the emergency department.   Time call ended:  9:16 AM   I provided 11 minutes of  non face-to-face time during this encounter.    Evelina Dun, FNP

## 2020-09-22 ENCOUNTER — Other Ambulatory Visit: Payer: Self-pay | Admitting: Family

## 2020-09-22 DIAGNOSIS — M79676 Pain in unspecified toe(s): Secondary | ICD-10-CM | POA: Diagnosis not present

## 2020-09-22 DIAGNOSIS — B351 Tinea unguium: Secondary | ICD-10-CM | POA: Diagnosis not present

## 2020-09-22 MED ORDER — CEPHALEXIN 500 MG PO CAPS: 500.0000 mg | ORAL_CAPSULE | Freq: Two times a day (BID) | ORAL | 0 refills | Status: DC

## 2020-09-23 LAB — URINE CULTURE

## 2020-10-14 DIAGNOSIS — Z435 Encounter for attention to cystostomy: Secondary | ICD-10-CM | POA: Diagnosis not present

## 2020-10-14 DIAGNOSIS — N131 Hydronephrosis with ureteral stricture, not elsewhere classified: Secondary | ICD-10-CM | POA: Diagnosis not present

## 2020-10-14 DIAGNOSIS — R338 Other retention of urine: Secondary | ICD-10-CM | POA: Diagnosis not present

## 2020-10-14 DIAGNOSIS — Z466 Encounter for fitting and adjustment of urinary device: Secondary | ICD-10-CM | POA: Diagnosis not present

## 2020-11-11 DIAGNOSIS — R338 Other retention of urine: Secondary | ICD-10-CM | POA: Diagnosis not present

## 2020-11-11 DIAGNOSIS — N131 Hydronephrosis with ureteral stricture, not elsewhere classified: Secondary | ICD-10-CM | POA: Diagnosis not present

## 2020-11-11 DIAGNOSIS — Z435 Encounter for attention to cystostomy: Secondary | ICD-10-CM | POA: Diagnosis not present

## 2020-11-11 DIAGNOSIS — Z466 Encounter for fitting and adjustment of urinary device: Secondary | ICD-10-CM | POA: Diagnosis not present

## 2020-11-12 ENCOUNTER — Telehealth: Payer: Self-pay | Admitting: Family Medicine

## 2020-11-12 DIAGNOSIS — E782 Mixed hyperlipidemia: Secondary | ICD-10-CM

## 2020-11-12 DIAGNOSIS — N183 Chronic kidney disease, stage 3 unspecified: Secondary | ICD-10-CM

## 2020-11-12 NOTE — Telephone Encounter (Signed)
Placed lab orders they can do them over at [facility].

## 2020-11-16 ENCOUNTER — Other Ambulatory Visit: Payer: Self-pay | Admitting: *Deleted

## 2020-11-16 DIAGNOSIS — I129 Hypertensive chronic kidney disease with stage 1 through stage 4 chronic kidney disease, or unspecified chronic kidney disease: Secondary | ICD-10-CM | POA: Diagnosis not present

## 2020-11-16 DIAGNOSIS — N183 Chronic kidney disease, stage 3 unspecified: Secondary | ICD-10-CM

## 2020-11-16 DIAGNOSIS — E782 Mixed hyperlipidemia: Secondary | ICD-10-CM

## 2020-11-16 DIAGNOSIS — E785 Hyperlipidemia, unspecified: Secondary | ICD-10-CM | POA: Diagnosis not present

## 2020-11-16 DIAGNOSIS — E611 Iron deficiency: Secondary | ICD-10-CM | POA: Diagnosis not present

## 2020-11-16 DIAGNOSIS — R809 Proteinuria, unspecified: Secondary | ICD-10-CM | POA: Diagnosis not present

## 2020-11-16 DIAGNOSIS — N1832 Chronic kidney disease, stage 3b: Secondary | ICD-10-CM | POA: Diagnosis not present

## 2020-11-17 LAB — CBC WITH DIFFERENTIAL/PLATELET
Basophils Absolute: 0.1 10*3/uL (ref 0.0–0.2)
Basos: 1 %
EOS (ABSOLUTE): 0.2 10*3/uL (ref 0.0–0.4)
Eos: 3 %
Hematocrit: 42.1 % (ref 37.5–51.0)
Hemoglobin: 15 g/dL (ref 13.0–17.7)
Immature Grans (Abs): 0 10*3/uL (ref 0.0–0.1)
Immature Granulocytes: 0 %
Lymphocytes Absolute: 1.4 10*3/uL (ref 0.7–3.1)
Lymphs: 26 %
MCH: 33.9 pg — ABNORMAL HIGH (ref 26.6–33.0)
MCHC: 35.6 g/dL (ref 31.5–35.7)
MCV: 95 fL (ref 79–97)
Monocytes Absolute: 0.4 10*3/uL (ref 0.1–0.9)
Monocytes: 8 %
Neutrophils Absolute: 3.3 10*3/uL (ref 1.4–7.0)
Neutrophils: 62 %
Platelets: 214 10*3/uL (ref 150–450)
RBC: 4.42 x10E6/uL (ref 4.14–5.80)
RDW: 14.9 % (ref 11.6–15.4)
WBC: 5.2 10*3/uL (ref 3.4–10.8)

## 2020-11-17 LAB — CMP14+EGFR
ALT: 14 IU/L (ref 0–44)
AST: 20 IU/L (ref 0–40)
Albumin/Globulin Ratio: 1 — ABNORMAL LOW (ref 1.2–2.2)
Albumin: 3.7 g/dL — ABNORMAL LOW (ref 3.8–4.8)
Alkaline Phosphatase: 86 IU/L (ref 44–121)
BUN/Creatinine Ratio: 11 (ref 10–24)
BUN: 21 mg/dL (ref 8–27)
Bilirubin Total: 0.5 mg/dL (ref 0.0–1.2)
CO2: 23 mmol/L (ref 20–29)
Calcium: 9 mg/dL (ref 8.6–10.2)
Chloride: 105 mmol/L (ref 96–106)
Creatinine, Ser: 1.85 mg/dL — ABNORMAL HIGH (ref 0.76–1.27)
Globulin, Total: 3.6 g/dL (ref 1.5–4.5)
Glucose: 98 mg/dL (ref 65–99)
Potassium: 4.6 mmol/L (ref 3.5–5.2)
Sodium: 143 mmol/L (ref 134–144)
Total Protein: 7.3 g/dL (ref 6.0–8.5)
eGFR: 41 mL/min/{1.73_m2} — ABNORMAL LOW (ref >59)

## 2020-11-17 LAB — LIPID PANEL
Chol/HDL Ratio: 3 ratio (ref 0.0–5.0)
Cholesterol, Total: 157 mg/dL (ref 100–199)
HDL: 52 mg/dL (ref >39)
LDL Chol Calc (NIH): 92 mg/dL (ref 0–99)
Triglycerides: 64 mg/dL (ref 0–149)
VLDL Cholesterol Cal: 13 mg/dL (ref 5–40)

## 2020-11-18 DIAGNOSIS — R809 Proteinuria, unspecified: Secondary | ICD-10-CM | POA: Diagnosis not present

## 2020-11-18 DIAGNOSIS — I950 Idiopathic hypotension: Secondary | ICD-10-CM | POA: Diagnosis not present

## 2020-11-18 DIAGNOSIS — N1832 Chronic kidney disease, stage 3b: Secondary | ICD-10-CM | POA: Diagnosis not present

## 2020-11-23 ENCOUNTER — Encounter: Payer: Self-pay | Admitting: Family Medicine

## 2020-11-23 ENCOUNTER — Ambulatory Visit (INDEPENDENT_AMBULATORY_CARE_PROVIDER_SITE_OTHER): Payer: Medicare Other | Admitting: Family Medicine

## 2020-11-23 ENCOUNTER — Other Ambulatory Visit: Payer: Self-pay

## 2020-11-23 VITALS — BP 107/74 | HR 88 | Ht 60.0 in | Wt 172.0 lb

## 2020-11-23 DIAGNOSIS — N183 Chronic kidney disease, stage 3 unspecified: Secondary | ICD-10-CM | POA: Diagnosis not present

## 2020-11-23 DIAGNOSIS — K219 Gastro-esophageal reflux disease without esophagitis: Secondary | ICD-10-CM

## 2020-11-23 DIAGNOSIS — E782 Mixed hyperlipidemia: Secondary | ICD-10-CM

## 2020-11-23 DIAGNOSIS — Z23 Encounter for immunization: Secondary | ICD-10-CM | POA: Diagnosis not present

## 2020-11-23 NOTE — Progress Notes (Signed)
BP 107/74   Pulse 88   Ht 5' (1.524 m)   Wt 172 lb (78 kg)   SpO2 98%   BMI 33.59 kg/m    Subjective:   Patient ID: Frank Levine, male    DOB: January 06, 1961, 60 y.o.   MRN: [id number]  HPI: Frank Levine is a 60 y.o. male presenting on 11/23/2020 for Medical Management of Chronic Issues, Hyperlipidemia, and Chronic Kidney Disease   HPI Hyperlipidemia Patient is coming in for recheck of his hyperlipidemia. The patient is currently taking simvastatin. They deny any issues with myalgias or history of liver damage from it. They deny any focal numbness or weakness or chest pain.   GERD Patient is currently on Prilosec.  She denies any major symptoms or abdominal pain or belching or burping. She denies any blood in her stool or lightheadedness or dizziness.   Down's syndrome and Alzheimer's disease Patient sent to sleep center during the day Alzheimer's disease and he has Down syndrome and Alzheimer's but his memory has been gradually worsening and continues to do so.  He was brought in here by his mother who watches over him.   Patient sees Dr. Wolfgang Phoenix for nephrology and CKD  Relevant past medical, surgical, family and social history reviewed and updated as indicated. Interim medical history since our last visit reviewed. Allergies and medications reviewed and updated.  Review of Systems  Constitutional:  Negative for chills and fever.  Eyes:  Negative for visual disturbance.  Respiratory:  Negative for chest tightness, shortness of breath and wheezing.   Cardiovascular:  Negative for chest pain, palpitations and leg swelling.  Skin:  Negative for rash.  Neurological:  Negative for speech difficulty.  Psychiatric/Behavioral:  Positive for confusion. Negative for dysphoric mood, self-injury, sleep disturbance and suicidal ideas. The patient is not nervous/anxious.   All other systems reviewed and are negative.  Per HPI unless specifically indicated above   Allergies as of  11/23/2020   No Known Allergies      Medication List        Accurate as of November 23, 2020 10:59 AM. If you have any questions, ask your nurse or doctor.          STOP taking these medications    cephALEXin 500 MG capsule Commonly known as: KEFLEX Stopped by: Elige Radon Cher Franzoni, MD       TAKE these medications    acetaminophen 325 MG tablet Commonly known as: TYLENOL Take 650 mg by mouth every 6 (six) hours as needed.   aspirin EC 81 MG tablet Take 81 mg by mouth daily.   cholecalciferol 25 MCG (1000 UNIT) tablet Commonly known as: VITAMIN D3 Take 1,000 Units by mouth daily.   clotrimazole-betamethasone cream Commonly known as: LOTRISONE APPLY TO AFFECTED SKIN ONCE DAILY FOR NO LONGER THAN 7 CONSECUTIVE DAYS   docusate sodium 100 MG capsule Commonly known as: COLACE Take 100 mg by mouth daily as needed.   nystatin powder Commonly known as: MYCOSTATIN/NYSTOP Apply 1 application topically 3 (three) times daily.   omeprazole 40 MG capsule Commonly known as: PRILOSEC Take 1 capsule (40 mg total) by mouth daily.   polyethylene glycol 17 g packet Commonly known as: MIRALAX / GLYCOLAX Take 17 g by mouth daily.   simvastatin 40 MG tablet Commonly known as: ZOCOR TAKE 1 TABLET BY MOUTH ONCE DAILY AT 6PM         Objective:   BP 107/74   Pulse 88   Ht  5' (1.524 m)   Wt 172 lb (78 kg)   SpO2 98%   BMI 33.59 kg/m   Wt Readings from Last 3 Encounters:  11/23/20 172 lb (78 kg)  05/20/20 163 lb (73.9 kg)  12/02/19 175 lb 14.8 oz (79.8 kg)    Physical Exam Vitals and nursing note reviewed.  Constitutional:      General: He is not in acute distress.    Appearance: He is well-developed. He is not diaphoretic.  Eyes:     General: No scleral icterus.    Conjunctiva/sclera: Conjunctivae normal.  Neck:     Thyroid: No thyromegaly.  Cardiovascular:     Rate and Rhythm: Normal rate and regular rhythm.     Heart sounds: Normal heart sounds. No  murmur heard. Pulmonary:     Effort: Pulmonary effort is normal. No respiratory distress.     Breath sounds: Normal breath sounds. No wheezing.  Musculoskeletal:        General: No swelling. Normal range of motion.     Cervical back: Neck supple.  Lymphadenopathy:     Cervical: No cervical adenopathy.  Skin:    General: Skin is warm and dry.     Findings: No rash.  Neurological:     Mental Status: He is alert and oriented to person, place, and time.     Coordination: Coordination normal.  Psychiatric:        Behavior: Behavior normal.    Results for orders placed or performed in visit on 11/16/20  CMP14+EGFR  Result Value Ref Range   Glucose 98 65 - 99 mg/dL   BUN 21 8 - 27 mg/dL   Creatinine, Ser 1.85 (H) 0.76 - 1.27 mg/dL   eGFR 41 (L) >59 mL/min/1.73   BUN/Creatinine Ratio 11 10 - 24   Sodium 143 134 - 144 mmol/L   Potassium 4.6 3.5 - 5.2 mmol/L   Chloride 105 96 - 106 mmol/L   CO2 23 20 - 29 mmol/L   Calcium 9.0 8.6 - 10.2 mg/dL   Total Protein 7.3 6.0 - 8.5 g/dL   Albumin 3.7 (L) 3.8 - 4.8 g/dL   Globulin, Total 3.6 1.5 - 4.5 g/dL   Albumin/Globulin Ratio 1.0 (L) 1.2 - 2.2   Bilirubin Total 0.5 0.0 - 1.2 mg/dL   Alkaline Phosphatase 86 44 - 121 IU/L   AST 20 0 - 40 IU/L   ALT 14 0 - 44 IU/L  CBC with Differential/Platelet  Result Value Ref Range   WBC 5.2 3.4 - 10.8 x10E3/uL   RBC 4.42 4.14 - 5.80 x10E6/uL   Hemoglobin 15.0 13.0 - 17.7 g/dL   Hematocrit 42.1 37.5 - 51.0 %   MCV 95 79 - 97 fL   MCH 33.9 (H) 26.6 - 33.0 pg   MCHC 35.6 31.5 - 35.7 g/dL   RDW 14.9 11.6 - 15.4 %   Platelets 214 150 - 450 x10E3/uL   Neutrophils 62 Not Estab. %   Lymphs 26 Not Estab. %   Monocytes 8 Not Estab. %   Eos 3 Not Estab. %   Basos 1 Not Estab. %   Neutrophils Absolute 3.3 1.4 - 7.0 x10E3/uL   Lymphocytes Absolute 1.4 0.7 - 3.1 x10E3/uL   Monocytes Absolute 0.4 0.1 - 0.9 x10E3/uL   EOS (ABSOLUTE) 0.2 0.0 - 0.4 x10E3/uL   Basophils Absolute 0.1 0.0 - 0.2 x10E3/uL    Immature Granulocytes 0 Not Estab. %   Immature Grans (Abs) 0.0 0.0 - 0.1 x10E3/uL  Lipid panel  Result Value Ref Range   Cholesterol, Total 157 100 - 199 mg/dL   Triglycerides 64 0 - 149 mg/dL   HDL 52 >39 mg/dL   VLDL Cholesterol Cal 13 5 - 40 mg/dL   LDL Chol Calc (NIH) 92 0 - 99 mg/dL   Chol/HDL Ratio 3.0 0.0 - 5.0 ratio    Assessment & Plan:   Problem List Items Addressed This Visit   None   Blood work looks good, will see back in 6 months. Follow up plan: No follow-ups on file.  Counseling provided for all of the vaccine components No orders of the defined types were placed in this encounter.   Caryl Pina, MD [facility] 11/23/2020, 10:59 AM

## 2020-12-14 DIAGNOSIS — Z435 Encounter for attention to cystostomy: Secondary | ICD-10-CM | POA: Diagnosis not present

## 2020-12-14 DIAGNOSIS — Z466 Encounter for fitting and adjustment of urinary device: Secondary | ICD-10-CM | POA: Diagnosis not present

## 2020-12-14 DIAGNOSIS — N131 Hydronephrosis with ureteral stricture, not elsewhere classified: Secondary | ICD-10-CM | POA: Diagnosis not present

## 2020-12-14 DIAGNOSIS — R338 Other retention of urine: Secondary | ICD-10-CM | POA: Diagnosis not present

## 2020-12-17 ENCOUNTER — Ambulatory Visit (INDEPENDENT_AMBULATORY_CARE_PROVIDER_SITE_OTHER): Payer: Medicare Other

## 2020-12-17 ENCOUNTER — Other Ambulatory Visit: Payer: Self-pay

## 2020-12-17 VITALS — Ht 60.0 in | Wt 172.0 lb

## 2020-12-17 DIAGNOSIS — Z0000 Encounter for general adult medical examination without abnormal findings: Secondary | ICD-10-CM

## 2020-12-17 MED ORDER — CLOTRIMAZOLE-BETAMETHASONE 1-0.05 % EX CREA
TOPICAL_CREAM | CUTANEOUS | 1 refills | Status: DC
Start: 2020-12-17 — End: 2021-11-19

## 2020-12-17 NOTE — Patient Instructions (Signed)
Frank Levine , Thank you for taking time to come for your Medicare Wellness Visit. I appreciate your ongoing commitment to your health goals. Please review the following plan we discussed and let me know if I can assist you in the future.   Screening recommendations/referrals: Colonoscopy: FOBT done with Dr Theador Hawthorne this year - repeat annually Recommended yearly ophthalmology/optometry visit for glaucoma screening and checkup Recommended yearly dental visit for hygiene and checkup  Vaccinations: Influenza vaccine: Done 11/23/2020 - Repeat annually Pneumococcal vaccine: Due - 2 doses one year apart Tdap vaccine: Done 04/01/2014 - Repeat in 10 years Shingles vaccine: First dose done 11/23/2020 - get second dose 2-6 months later   Covid-19: Done 05/23/2019 & 06/21/2019 - due for booster  Advanced directives: Please bring a copy of your health care power of attorney and living will to the office to be added to your chart at your convenience.   Conditions/risks identified: Aim for 30 minutes of exercise or brisk walking each day, drink 6-8 glasses of water and eat lots of fruits and vegetables.   Next appointment: Follow up in one year for your annual wellness visit   Preventive Care 40-64 Years, Male Preventive care refers to lifestyle choices and visits with your health care provider that can promote health and wellness. What does preventive care include? A yearly physical exam. This is also called an annual well check. Dental exams once or twice a year. Routine eye exams. Ask your health care provider how often you should have your eyes checked. Personal lifestyle choices, including: Daily care of your teeth and gums. Regular physical activity. Eating a healthy diet. Avoiding tobacco and drug use. Limiting alcohol use. Practicing safe sex. Taking low-dose aspirin every day starting at age 74. What happens during an annual well check? The services and screenings done by your health care  provider during your annual well check will depend on your age, overall health, lifestyle risk factors, and family history of disease. Counseling  Your health care provider may ask you questions about your: Alcohol use. Tobacco use. Drug use. Emotional well-being. Home and relationship well-being. Sexual activity. Eating habits. Work and work Statistician. Screening  You may have the following tests or measurements: Height, weight, and BMI. Blood pressure. Lipid and cholesterol levels. These may be checked every 5 years, or more frequently if you are over 66 years old. Skin check. Lung cancer screening. You may have this screening every year starting at age 54 if you have a 30-pack-year history of smoking and currently smoke or have quit within the past 15 years. Fecal occult blood test (FOBT) of the stool. You may have this test every year starting at age 25. Flexible sigmoidoscopy or colonoscopy. You may have a sigmoidoscopy every 5 years or a colonoscopy every 10 years starting at age 30. Prostate cancer screening. Recommendations will vary depending on your family history and other risks. Hepatitis C blood test. Hepatitis B blood test. Sexually transmitted disease (STD) testing. Diabetes screening. This is done by checking your blood sugar (glucose) after you have not eaten for a while (fasting). You may have this done every 1-3 years. Discuss your test results, treatment options, and if necessary, the need for more tests with your health care provider. Vaccines  Your health care provider may recommend certain vaccines, such as: Influenza vaccine. This is recommended every year. Tetanus, diphtheria, and acellular pertussis (Tdap, Td) vaccine. You may need a Td booster every 10 years. Zoster vaccine. You may need this after  age 52. Pneumococcal 13-valent conjugate (PCV13) vaccine. You may need this if you have certain conditions and have not been vaccinated. Pneumococcal  polysaccharide (PPSV23) vaccine. You may need one or two doses if you smoke cigarettes or if you have certain conditions. Talk to your health care provider about which screenings and vaccines you need and how often you need them. This information is not intended to replace advice given to you by your health care provider. Make sure you discuss any questions you have with your health care provider. Document Released: 03/20/2015 Document Revised: 11/11/2015 Document Reviewed: 12/23/2014 Elsevier Interactive Patient Education  2017 [place] Prevention in the Home Falls can cause injuries. They can happen to people of all ages. There are many things you can do to make your home safe and to help prevent falls. What can I do on the outside of my home? Regularly fix the edges of walkways and driveways and fix any cracks. Remove anything that might make you trip as you walk through a door, such as a raised step or threshold. Trim any bushes or trees on the path to your home. Use bright outdoor lighting. Clear any walking paths of anything that might make someone trip, such as rocks or tools. Regularly check to see if handrails are loose or broken. Make sure that both sides of any steps have handrails. Any raised decks and porches should have guardrails on the edges. Have any leaves, snow, or ice cleared regularly. Use sand or salt on walking paths during winter. Clean up any spills in your garage right away. This includes oil or grease spills. What can I do in the bathroom? Use night lights. Install grab bars by the toilet and in the tub and shower. Do not use towel bars as grab bars. Use non-skid mats or decals in the tub or shower. If you need to sit down in the shower, use a plastic, non-slip stool. Keep the floor dry. Clean up any water that spills on the floor as soon as it happens. Remove soap buildup in the tub or shower regularly. Attach bath mats securely with double-sided  non-slip rug tape. Do not have throw rugs and other things on the floor that can make you trip. What can I do in the bedroom? Use night lights. Make sure that you have a light by your bed that is easy to reach. Do not use any sheets or blankets that are too big for your bed. They should not hang down onto the floor. Have a firm chair that has side arms. You can use this for support while you get dressed. Do not have throw rugs and other things on the floor that can make you trip. What can I do in the kitchen? Clean up any spills right away. Avoid walking on wet floors. Keep items that you use a lot in easy-to-reach places. If you need to reach something above you, use a strong step stool that has a grab bar. Keep electrical cords out of the way. Do not use floor polish or wax that makes floors slippery. If you must use wax, use non-skid floor wax. Do not have throw rugs and other things on the floor that can make you trip. What can I do with my stairs? Do not leave any items on the stairs. Make sure that there are handrails on both sides of the stairs and use them. Fix handrails that are broken or loose. Make sure that handrails are as long  as the stairways. Check any carpeting to make sure that it is firmly attached to the stairs. Fix any carpet that is loose or worn. Avoid having throw rugs at the top or bottom of the stairs. If you do have throw rugs, attach them to the floor with carpet tape. Make sure that you have a light switch at the top of the stairs and the bottom of the stairs. If you do not have them, ask someone to add them for you. What else can I do to help prevent falls? Wear shoes that: Do not have high heels. Have rubber bottoms. Are comfortable and fit you well. Are closed at the toe. Do not wear sandals. If you use a stepladder: Make sure that it is fully opened. Do not climb a closed stepladder. Make sure that both sides of the stepladder are locked into place. Ask  someone to hold it for you, if possible. Clearly mark and make sure that you can see: Any grab bars or handrails. First and last steps. Where the edge of each step is. Use tools that help you move around (mobility aids) if they are needed. These include: Canes. Walkers. Scooters. Crutches. Turn on the lights when you go into a dark area. Replace any light bulbs as soon as they burn out. Set up your furniture so you have a clear path. Avoid moving your furniture around. If any of your floors are uneven, fix them. If there are any pets around you, be aware of where they are. Review your medicines with your doctor. Some medicines can make you feel dizzy. This can increase your chance of falling. Ask your doctor what other things that you can do to help prevent falls. This information is not intended to replace advice given to you by your health care provider. Make sure you discuss any questions you have with your health care provider. Document Released: 12/18/2008 Document Revised: 07/30/2015 Document Reviewed: 03/28/2014 Elsevier Interactive Patient Education  2017 [employer].

## 2020-12-17 NOTE — Telephone Encounter (Signed)
Frank Levine sent in this cream for an area under his belly that gets inflamed sometimes. He is out. Has a large coverage area and only a small tube was called in. They want to know if you can rf and possibly send in a larger tube. Thanks
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
## 2020-12-24 ENCOUNTER — Ambulatory Visit (INDEPENDENT_AMBULATORY_CARE_PROVIDER_SITE_OTHER): Payer: Medicare Other | Admitting: Gastroenterology

## 2020-12-28 ENCOUNTER — Telehealth: Payer: Self-pay | Admitting: Family Medicine

## 2020-12-28 ENCOUNTER — Other Ambulatory Visit: Payer: Self-pay

## 2020-12-28 ENCOUNTER — Other Ambulatory Visit: Payer: Medicare Other

## 2020-12-28 DIAGNOSIS — R399 Unspecified symptoms and signs involving the genitourinary system: Secondary | ICD-10-CM

## 2020-12-28 LAB — URINALYSIS
Bilirubin, UA: NEGATIVE
Glucose, UA: NEGATIVE
Ketones, UA: NEGATIVE
Nitrite, UA: POSITIVE — AB
Protein,UA: NEGATIVE
RBC, UA: NEGATIVE
Specific Gravity, UA: 1.015 (ref 1.005–1.030)
Urobilinogen, Ur: 0.2 mg/dL (ref 0.2–1.0)
pH, UA: 8 — ABNORMAL HIGH (ref 5.0–7.5)

## 2020-12-28 NOTE — Telephone Encounter (Signed)
ordered

## 2020-12-29 ENCOUNTER — Encounter: Payer: Self-pay | Admitting: Nurse Practitioner

## 2020-12-29 ENCOUNTER — Ambulatory Visit (INDEPENDENT_AMBULATORY_CARE_PROVIDER_SITE_OTHER): Payer: Medicare Other | Admitting: Nurse Practitioner

## 2020-12-29 DIAGNOSIS — R300 Dysuria: Secondary | ICD-10-CM | POA: Diagnosis not present

## 2020-12-29 MED ORDER — NITROFURANTOIN MONOHYD MACRO 100 MG PO CAPS: 100.0000 mg | ORAL_CAPSULE | Freq: Two times a day (BID) | ORAL | 0 refills | Status: DC

## 2020-12-29 NOTE — Progress Notes (Signed)
   Virtual Visit  Note Due to COVID-19 pandemic this visit was conducted virtually. This visit type was conducted due to national recommendations for restrictions regarding the COVID-19 Pandemic (e.g. social distancing, sheltering in place) in an effort to limit this patient's exposure and mitigate transmission in our community. All issues noted in this document were discussed and addressed.  A physical exam was not performed with this format.  I connected with Sheliah Levine on 12/29/20 at 08:15 am  by telephone and verified that I am speaking with the correct person using two identifiers. Frank Levine is currently located at home during visit. The provider, Ivy Lynn, NP is located in their office at time of visit.  I discussed the limitations, risks, security and privacy concerns of performing an evaluation and management service by telephone and the availability of in person appointments. I also discussed with the patient that there may be a patient responsible charge related to this service. The patient expressed understanding and agreed to proceed.   History and Present Illness:  Dysuria  This is a new problem. The current episode started in the past 7 days. The problem has been gradually worsening. There has been no fever. Pertinent negatives include no chills, discharge, nausea, urgency or vomiting. Associated symptoms comments: Dark urine, follow order urine, confusion.. He has tried nothing for the symptoms.     Review of Systems  Constitutional:  Negative for chills.  Gastrointestinal:  Negative for abdominal pain, nausea and vomiting.  Genitourinary:  Positive for dysuria. Negative for urgency.  Skin:  Negative for rash.  All other systems reviewed and are negative.   Observations/Objective: Televisit patient not in distress.  Assessment and Plan: Urinalysis positive for nitrites and leukocytes. -Increase hydration -Tylenol ibuprofen for pain -Nitrofurantoin 100 mg  tablet by mouth -Urine cultures completed with results pending.  Follow Up Instructions: Follow-up with worsening unresolved symptoms.    I discussed the assessment and treatment plan with the patient. The patient was provided an opportunity to ask questions and all were answered. The patient agreed with the plan and demonstrated an understanding of the instructions.   The patient was advised to call back or seek an in-person evaluation if the symptoms worsen or if the condition fails to improve as anticipated.  The above assessment and management plan was discussed with the patient. The patient verbalized understanding of and has agreed to the management plan. Patient is aware to call the clinic if symptoms persist or worsen. Patient is aware when to return to the clinic for a follow-up visit. Patient educated on when it is appropriate to go to the emergency department.   Time call ended:  08:24 am   I provided 8 minutes of  non face-to-face time during this encounter.    Ivy Lynn, NP

## 2020-12-29 NOTE — Assessment & Plan Note (Signed)
Urinalysis positive for nitrites and leukocytes. -Increase hydration -Tylenol ibuprofen for pain -Nitrofurantoin 100 mg tablet by mouth -Urine cultures completed with results pending. -Follow-up with worsening unresolved symptoms.  Rx sent to pharmacy.

## 2021-01-01 LAB — URINE CULTURE

## 2021-01-14 DIAGNOSIS — N131 Hydronephrosis with ureteral stricture, not elsewhere classified: Secondary | ICD-10-CM | POA: Diagnosis not present

## 2021-01-14 DIAGNOSIS — Z7982 Long term (current) use of aspirin: Secondary | ICD-10-CM | POA: Diagnosis not present

## 2021-01-14 DIAGNOSIS — R338 Other retention of urine: Secondary | ICD-10-CM | POA: Diagnosis not present

## 2021-01-14 DIAGNOSIS — Z435 Encounter for attention to cystostomy: Secondary | ICD-10-CM | POA: Diagnosis not present

## 2021-01-14 DIAGNOSIS — D649 Anemia, unspecified: Secondary | ICD-10-CM | POA: Diagnosis not present

## 2021-01-14 DIAGNOSIS — Z9181 History of falling: Secondary | ICD-10-CM | POA: Diagnosis not present

## 2021-01-14 DIAGNOSIS — E785 Hyperlipidemia, unspecified: Secondary | ICD-10-CM | POA: Diagnosis not present

## 2021-01-14 DIAGNOSIS — Z466 Encounter for fitting and adjustment of urinary device: Secondary | ICD-10-CM | POA: Diagnosis not present

## 2021-01-18 ENCOUNTER — Ambulatory Visit: Payer: Medicare Other | Admitting: Family Medicine

## 2021-01-19 ENCOUNTER — Other Ambulatory Visit: Payer: Self-pay

## 2021-01-19 ENCOUNTER — Ambulatory Visit (INDEPENDENT_AMBULATORY_CARE_PROVIDER_SITE_OTHER): Payer: Medicare Other

## 2021-01-19 ENCOUNTER — Ambulatory Visit (INDEPENDENT_AMBULATORY_CARE_PROVIDER_SITE_OTHER): Payer: Medicare Other | Admitting: Family Medicine

## 2021-01-19 ENCOUNTER — Encounter: Payer: Self-pay | Admitting: Family Medicine

## 2021-01-19 VITALS — BP 118/77 | HR 98 | Temp 97.1°F | Ht 60.0 in | Wt 176.0 lb

## 2021-01-19 DIAGNOSIS — M25562 Pain in left knee: Secondary | ICD-10-CM

## 2021-01-19 DIAGNOSIS — M1712 Unilateral primary osteoarthritis, left knee: Secondary | ICD-10-CM | POA: Diagnosis not present

## 2021-01-19 DIAGNOSIS — M79676 Pain in unspecified toe(s): Secondary | ICD-10-CM | POA: Diagnosis not present

## 2021-01-19 DIAGNOSIS — B351 Tinea unguium: Secondary | ICD-10-CM | POA: Diagnosis not present

## 2021-01-19 MED ORDER — PREDNISONE 10 MG PO TABS: ORAL_TABLET | ORAL | 0 refills | Status: DC

## 2021-01-19 NOTE — Progress Notes (Signed)
Subjective:  Patient ID: Frank Levine, male    DOB: [birth year]  Age: 60 y.o. MRN: [id number]  CC: Knee Pain (Left knee)   HPI GERADO NABERS presents for left leg swollen. History of phlebitis. Left knee hurts. Onset was on awakening 1 week ago. Getting better daily. Doing some therapy with his caregiver/cousin/guardian.   Depression screen [facility] 2/9 01/19/2021 01/19/2021 12/17/2020  Decreased Interest 0 0 0  Down, Depressed, Hopeless 0 0 0  PHQ - 2 Score 0 0 0    History Antolin has a past medical history of Anxiety, Arthritis, Bilateral hydronephrosis, BPH (benign prostatic hypertrophy), Chronic cystitis, Chronic urethral stricture, CKD (chronic kidney disease), stage II, Down's syndrome (09/27/2011), GERD (gastroesophageal reflux disease), History of DVT of lower extremity, History of Meckel's diverticulum, History of pulmonary embolus (PE), Hyperlipemia, Mental retardation, Neurogenic bladder, and Wears glasses.   He has a past surgical history that includes CYSTO/  BALLOON DILATTION URETHRAL STRICTURE/ BILATERAL RETROGRADE PYELOGRAM/ BILATERAL URETERAL STENTS/ BLADDER BX/  LEFT URETEROSCOPY (01-27-2010); CYSTO/  BALLOON DILATION URETHRAL STRICTURE/  REMOVAL & REPLACEMENT RIGHT URETERAL STETN/  RIGHT RETROGRADE PYELOGRAM/   LEFT URETEROSCOPY/  ATTEMPTED REMOVAL LEFT STENT (05-10-2010); CYSTO/  BALLOON DILATION URETHRAL STRICTURE/  URETEROSCOPY/ REMOVAL LEFT STENT (10-17-2010); Cystoscopy with urethral dilatation (N/A, 07/10/2013); Cystogram (N/A, 07/10/2013); Cystoscopy with urethral dilatation (N/A, 07/02/2014); Cystogram (N/A, 07/02/2014); Cystoscopy w/ retrogrades (Right, 06/03/2015); Insertion of suprapubic catheter (N/A, 06/03/2015); Balloon dilation (N/A, 06/03/2015); and Ventral hernia repair (N/A, 12/26/2016).   His family history includes Cancer in his father; Hyperlipidemia in his mother; Hypertension in his mother.He reports that he has never smoked. He has never used smokeless tobacco. He  reports that he does not drink alcohol and does not use drugs.    ROS Review of Systems  Constitutional:  Negative for fever.  Respiratory:  Negative for shortness of breath.   Cardiovascular:  Negative for chest pain.  Musculoskeletal:  Negative for arthralgias.  Skin:  Negative for rash.   Objective:  BP 118/77   Pulse 98   Temp (!) 97.1 F (36.2 C)   Ht 5' (1.524 m)   Wt 176 lb (79.8 kg)   SpO2 98%   BMI 34.37 kg/m   BP Readings from Last 3 Encounters:  01/19/21 118/77  11/23/20 107/74  05/20/20 97/63    Wt Readings from Last 3 Encounters:  01/19/21 176 lb (79.8 kg)  12/17/20 172 lb (78 kg)  11/23/20 172 lb (78 kg)     Physical Exam Vitals reviewed.  Constitutional:      Appearance: He is well-developed.  HENT:     Head: Normocephalic and atraumatic.     Right Ear: External ear normal.     Left Ear: External ear normal.     Mouth/Throat:     Pharynx: No oropharyngeal exudate or posterior oropharyngeal erythema.  Eyes:     Pupils: Pupils are equal, round, and reactive to light.  Cardiovascular:     Rate and Rhythm: Normal rate and regular rhythm.     Heart sounds: No murmur heard. Pulmonary:     Effort: No respiratory distress.     Breath sounds: Normal breath sounds.  Musculoskeletal:        General: Tenderness (left anterior knee.) present. No swelling or signs of injury.  Skin:    General: Skin is warm and dry.  Neurological:     Mental Status: He is alert.      Assessment & Plan:   Lorena was seen  today for knee pain.  Diagnoses and all orders for this visit:  Left anterior knee pain -     DG Knee 1-2 Views Left; Future  Other orders -     predniSONE (DELTASONE) 10 MG tablet; Take 5 daily for 2 days followed by 4,3,2 and 1 for 2 days each.      I am having [name] start on predniSONE. I am also having him maintain his aspirin EC, docusate sodium, cholecalciferol, acetaminophen, simvastatin, polyethylene glycol, omeprazole,  nystatin, ferrous sulfate, ketoconazole, clotrimazole-betamethasone, and nitrofurantoin (macrocrystal-monohydrate).  Allergies as of 01/19/2021   No Known Allergies      Medication List        Accurate as of January 19, 2021  6:56 PM. If you have any questions, ask your nurse or doctor.          acetaminophen 325 MG tablet Commonly known as: TYLENOL Take 650 mg by mouth every 6 (six) hours as needed.   aspirin EC 81 MG tablet Take 81 mg by mouth daily.   cholecalciferol 25 MCG (1000 UNIT) tablet Commonly known as: VITAMIN D3 Take 1,000 Units by mouth daily.   clotrimazole-betamethasone cream Commonly known as: LOTRISONE APPLY TO AFFECTED SKIN ONCE DAILY FOR NO LONGER THAN 7 CONSECUTIVE DAYS   docusate sodium 100 MG capsule Commonly known as: COLACE Take 100 mg by mouth daily as needed.   ferrous sulfate 325 (65 FE) MG EC tablet Take by mouth.   ketoconazole 2 % cream Commonly known as: NIZORAL Apply topically 2 (two) times daily.   nitrofurantoin (macrocrystal-monohydrate) 100 MG capsule Commonly known as: Macrobid Take 1 capsule (100 mg total) by mouth 2 (two) times daily. 1 po BId   nystatin powder Commonly known as: MYCOSTATIN/NYSTOP Apply 1 application topically 3 (three) times daily.   omeprazole 40 MG capsule Commonly known as: PRILOSEC Take 1 capsule (40 mg total) by mouth daily.   polyethylene glycol 17 g packet Commonly known as: MIRALAX / GLYCOLAX Take 17 g by mouth daily.   predniSONE 10 MG tablet Commonly known as: DELTASONE Take 5 daily for 2 days followed by 4,3,2 and 1 for 2 days each. Started by: Claretta Fraise, MD   simvastatin 40 MG tablet Commonly known as: ZOCOR TAKE 1 TABLET BY MOUTH ONCE DAILY AT 6PM         Follow-up: No follow-ups on file.  Claretta Fraise, M.D.

## 2021-02-12 DIAGNOSIS — Z9181 History of falling: Secondary | ICD-10-CM | POA: Diagnosis not present

## 2021-02-12 DIAGNOSIS — Z7982 Long term (current) use of aspirin: Secondary | ICD-10-CM | POA: Diagnosis not present

## 2021-02-12 DIAGNOSIS — D649 Anemia, unspecified: Secondary | ICD-10-CM | POA: Diagnosis not present

## 2021-02-12 DIAGNOSIS — Z435 Encounter for attention to cystostomy: Secondary | ICD-10-CM | POA: Diagnosis not present

## 2021-02-12 DIAGNOSIS — R338 Other retention of urine: Secondary | ICD-10-CM | POA: Diagnosis not present

## 2021-02-12 DIAGNOSIS — E785 Hyperlipidemia, unspecified: Secondary | ICD-10-CM | POA: Diagnosis not present

## 2021-02-12 DIAGNOSIS — N131 Hydronephrosis with ureteral stricture, not elsewhere classified: Secondary | ICD-10-CM | POA: Diagnosis not present

## 2021-02-12 DIAGNOSIS — Z466 Encounter for fitting and adjustment of urinary device: Secondary | ICD-10-CM | POA: Diagnosis not present

## 2021-02-15 DIAGNOSIS — I950 Idiopathic hypotension: Secondary | ICD-10-CM | POA: Diagnosis not present

## 2021-02-15 DIAGNOSIS — Z79899 Other long term (current) drug therapy: Secondary | ICD-10-CM | POA: Diagnosis not present

## 2021-02-15 DIAGNOSIS — R809 Proteinuria, unspecified: Secondary | ICD-10-CM | POA: Diagnosis not present

## 2021-02-15 DIAGNOSIS — D519 Vitamin B12 deficiency anemia, unspecified: Secondary | ICD-10-CM | POA: Diagnosis not present

## 2021-02-15 DIAGNOSIS — N1832 Chronic kidney disease, stage 3b: Secondary | ICD-10-CM | POA: Diagnosis not present

## 2021-02-16 DIAGNOSIS — R338 Other retention of urine: Secondary | ICD-10-CM | POA: Diagnosis not present

## 2021-02-18 DIAGNOSIS — E611 Iron deficiency: Secondary | ICD-10-CM | POA: Diagnosis not present

## 2021-02-18 DIAGNOSIS — N1832 Chronic kidney disease, stage 3b: Secondary | ICD-10-CM | POA: Diagnosis not present

## 2021-02-18 DIAGNOSIS — I950 Idiopathic hypotension: Secondary | ICD-10-CM | POA: Diagnosis not present

## 2021-02-18 DIAGNOSIS — R809 Proteinuria, unspecified: Secondary | ICD-10-CM | POA: Diagnosis not present

## 2021-03-11 DIAGNOSIS — R338 Other retention of urine: Secondary | ICD-10-CM | POA: Diagnosis not present

## 2021-03-11 DIAGNOSIS — Z7982 Long term (current) use of aspirin: Secondary | ICD-10-CM | POA: Diagnosis not present

## 2021-03-11 DIAGNOSIS — D649 Anemia, unspecified: Secondary | ICD-10-CM | POA: Diagnosis not present

## 2021-03-11 DIAGNOSIS — E785 Hyperlipidemia, unspecified: Secondary | ICD-10-CM | POA: Diagnosis not present

## 2021-03-11 DIAGNOSIS — Z9181 History of falling: Secondary | ICD-10-CM | POA: Diagnosis not present

## 2021-03-11 DIAGNOSIS — Z435 Encounter for attention to cystostomy: Secondary | ICD-10-CM | POA: Diagnosis not present

## 2021-03-11 DIAGNOSIS — N131 Hydronephrosis with ureteral stricture, not elsewhere classified: Secondary | ICD-10-CM | POA: Diagnosis not present

## 2021-03-11 DIAGNOSIS — Z466 Encounter for fitting and adjustment of urinary device: Secondary | ICD-10-CM | POA: Diagnosis not present

## 2021-04-05 DIAGNOSIS — N131 Hydronephrosis with ureteral stricture, not elsewhere classified: Secondary | ICD-10-CM | POA: Diagnosis not present

## 2021-04-05 DIAGNOSIS — E785 Hyperlipidemia, unspecified: Secondary | ICD-10-CM | POA: Diagnosis not present

## 2021-04-05 DIAGNOSIS — Z435 Encounter for attention to cystostomy: Secondary | ICD-10-CM | POA: Diagnosis not present

## 2021-04-05 DIAGNOSIS — Z7982 Long term (current) use of aspirin: Secondary | ICD-10-CM | POA: Diagnosis not present

## 2021-04-05 DIAGNOSIS — R338 Other retention of urine: Secondary | ICD-10-CM | POA: Diagnosis not present

## 2021-04-05 DIAGNOSIS — Z9181 History of falling: Secondary | ICD-10-CM | POA: Diagnosis not present

## 2021-04-05 DIAGNOSIS — Z466 Encounter for fitting and adjustment of urinary device: Secondary | ICD-10-CM | POA: Diagnosis not present

## 2021-04-05 DIAGNOSIS — D649 Anemia, unspecified: Secondary | ICD-10-CM | POA: Diagnosis not present

## 2021-04-06 DIAGNOSIS — B351 Tinea unguium: Secondary | ICD-10-CM | POA: Diagnosis not present

## 2021-04-06 DIAGNOSIS — M79676 Pain in unspecified toe(s): Secondary | ICD-10-CM | POA: Diagnosis not present

## 2021-04-15 DIAGNOSIS — E785 Hyperlipidemia, unspecified: Secondary | ICD-10-CM | POA: Diagnosis not present

## 2021-04-15 DIAGNOSIS — Z466 Encounter for fitting and adjustment of urinary device: Secondary | ICD-10-CM | POA: Diagnosis not present

## 2021-04-15 DIAGNOSIS — D649 Anemia, unspecified: Secondary | ICD-10-CM | POA: Diagnosis not present

## 2021-04-15 DIAGNOSIS — Z435 Encounter for attention to cystostomy: Secondary | ICD-10-CM | POA: Diagnosis not present

## 2021-04-15 DIAGNOSIS — R338 Other retention of urine: Secondary | ICD-10-CM | POA: Diagnosis not present

## 2021-04-15 DIAGNOSIS — Z7982 Long term (current) use of aspirin: Secondary | ICD-10-CM | POA: Diagnosis not present

## 2021-04-15 DIAGNOSIS — N131 Hydronephrosis with ureteral stricture, not elsewhere classified: Secondary | ICD-10-CM | POA: Diagnosis not present

## 2021-04-15 DIAGNOSIS — Z9181 History of falling: Secondary | ICD-10-CM | POA: Diagnosis not present

## 2021-05-07 DIAGNOSIS — Z9181 History of falling: Secondary | ICD-10-CM | POA: Diagnosis not present

## 2021-05-07 DIAGNOSIS — R338 Other retention of urine: Secondary | ICD-10-CM | POA: Diagnosis not present

## 2021-05-07 DIAGNOSIS — N131 Hydronephrosis with ureteral stricture, not elsewhere classified: Secondary | ICD-10-CM | POA: Diagnosis not present

## 2021-05-07 DIAGNOSIS — Z466 Encounter for fitting and adjustment of urinary device: Secondary | ICD-10-CM | POA: Diagnosis not present

## 2021-05-07 DIAGNOSIS — D649 Anemia, unspecified: Secondary | ICD-10-CM | POA: Diagnosis not present

## 2021-05-07 DIAGNOSIS — E785 Hyperlipidemia, unspecified: Secondary | ICD-10-CM | POA: Diagnosis not present

## 2021-05-07 DIAGNOSIS — Z7982 Long term (current) use of aspirin: Secondary | ICD-10-CM | POA: Diagnosis not present

## 2021-05-07 DIAGNOSIS — Z435 Encounter for attention to cystostomy: Secondary | ICD-10-CM | POA: Diagnosis not present

## 2021-05-10 DIAGNOSIS — R338 Other retention of urine: Secondary | ICD-10-CM | POA: Diagnosis not present

## 2021-05-17 DIAGNOSIS — N1832 Chronic kidney disease, stage 3b: Secondary | ICD-10-CM | POA: Diagnosis not present

## 2021-05-17 DIAGNOSIS — I950 Idiopathic hypotension: Secondary | ICD-10-CM | POA: Diagnosis not present

## 2021-05-17 DIAGNOSIS — R809 Proteinuria, unspecified: Secondary | ICD-10-CM | POA: Diagnosis not present

## 2021-05-17 DIAGNOSIS — E611 Iron deficiency: Secondary | ICD-10-CM | POA: Diagnosis not present

## 2021-05-24 ENCOUNTER — Ambulatory Visit (INDEPENDENT_AMBULATORY_CARE_PROVIDER_SITE_OTHER): Payer: Medicare Other | Admitting: Family Medicine

## 2021-05-24 ENCOUNTER — Encounter: Payer: Self-pay | Admitting: Family Medicine

## 2021-05-24 VITALS — BP 111/73 | HR 88 | Ht 60.0 in | Wt 176.0 lb

## 2021-05-24 DIAGNOSIS — N183 Chronic kidney disease, stage 3 unspecified: Secondary | ICD-10-CM | POA: Diagnosis not present

## 2021-05-24 DIAGNOSIS — F0280 Dementia in other diseases classified elsewhere without behavioral disturbance: Secondary | ICD-10-CM

## 2021-05-24 DIAGNOSIS — Z23 Encounter for immunization: Secondary | ICD-10-CM

## 2021-05-24 DIAGNOSIS — E782 Mixed hyperlipidemia: Secondary | ICD-10-CM

## 2021-05-24 DIAGNOSIS — G300 Alzheimer's disease with early onset: Secondary | ICD-10-CM

## 2021-05-24 DIAGNOSIS — K219 Gastro-esophageal reflux disease without esophagitis: Secondary | ICD-10-CM

## 2021-05-24 MED ORDER — SIMVASTATIN 40 MG PO TABS: 40.0000 mg | ORAL_TABLET | Freq: Every day | ORAL | 3 refills | Status: DC

## 2021-05-24 MED ORDER — OMEPRAZOLE 40 MG PO CPDR: 40.0000 mg | DELAYED_RELEASE_CAPSULE | Freq: Every day | ORAL | 3 refills | Status: DC

## 2021-05-24 NOTE — Progress Notes (Signed)
? ?BP 111/73   Pulse 88   Ht 5' (1.524 m)   Wt 176 lb (79.8 kg)   SpO2 97%   BMI 34.37 kg/m?   ? ?Subjective:  ? ?Patient ID: Frank Levine, male    DOB: 12-03-1960, 61 y.o.   MRN: [id number] ? ?HPI: ?Frank Levine is a 61 y.o. male presenting on 05/24/2021 for Medical Management of Chronic Issues, Chronic Kidney Disease, and Hyperlipidemia ? ? ?HPI ?Hyperlipidemia ?Patient is coming in for recheck of his hyperlipidemia. The patient is currently taking simvastatin. They deny any issues with myalgias or history of liver damage from it. They deny any focal numbness or weakness or chest pain.  ? ?CKD recheck ?Patient has stage III kidney disease and monitoring, he sees nephrology.  He had blood work done through nephrology just last week.  Able to see the labs through [facility] and everything that they did test looks good so for the creatinine has slightly worsened.  He will follow-up with nephrology for this. ? ?GERD ?Patient is currently on omeprazole.  She denies any major symptoms or abdominal pain or belching or burping. She denies any blood in her stool or lightheadedness or dizziness.  ? ?Patient is here with his mother and she says that his memory has continued to worsen and is forgetting more simple things and needs more constant care and is wandering.  She does have a caregiver there with her all day during the night and [facility]  She says he has no desire to do even the things that he used to like to do.  He is still eating and his appetite is up but his mind is almost completely gone.  Sometimes he does not remember people that he should know as well. ? ?Relevant past medical, surgical, family and social history reviewed and updated as indicated. Interim medical history since our last visit reviewed. ?Allergies and medications reviewed and updated. ? ?Review of Systems  ?Constitutional:  Negative for chills and fever.  ?Eyes:  Negative for visual disturbance.  ?Respiratory:   Negative for shortness of breath and wheezing.   ?Cardiovascular:  Negative for chest pain and leg swelling.  ?Skin:  Negative for rash.  ?Neurological:  Negative for dizziness and weakness.  ?Psychiatric/Behavioral:  Positive for confusion and decreased concentration. Negative for behavioral problems, dysphoric mood, self-injury, sleep disturbance and suicidal ideas. The patient is not nervous/anxious.   ?All other systems reviewed and are negative. ? ?Per HPI unless specifically indicated above ? ? ?Allergies as of 05/24/2021   ?No Known Allergies ?  ? ?  ?Medication List  ?  ? ?  ? Accurate as of May 24, 2021  9:47 AM. If you have any questions, ask your nurse or doctor.  ?  ?  ? ?  ? ?STOP taking these medications   ? ?nitrofurantoin (macrocrystal-monohydrate) 100 MG capsule ?Commonly known as: Macrobid ?Stopped by: Worthy Rancher, MD ?  ?predniSONE 10 MG tablet ?Commonly known as: DELTASONE ?Stopped by: Worthy Rancher, MD ?  ? ?  ? ?TAKE these medications   ? ?acetaminophen 325 MG tablet ?Commonly known as: TYLENOL ?Take 650 mg by mouth every 6 (six) hours as needed. ?  ?aspirin EC 81 MG tablet ?Take 81 mg by mouth daily. ?  ?cholecalciferol 25 MCG (1000 UNIT) tablet ?Commonly known as: VITAMIN D3 ?Take 1,000 Units by mouth daily. ?  ?clotrimazole-betamethasone cream ?Commonly known as: LOTRISONE ?APPLY TO AFFECTED  SKIN ONCE DAILY FOR NO LONGER THAN 7 CONSECUTIVE DAYS ?  ?docusate sodium 100 MG capsule ?Commonly known as: COLACE ?Take 100 mg by mouth daily as needed. ?  ?ferrous sulfate 325 (65 FE) MG EC tablet ?Take by mouth. ?  ?ketoconazole 2 % cream ?Commonly known as: NIZORAL ?Apply topically 2 (two) times daily. ?  ?nystatin powder ?Commonly known as: MYCOSTATIN/NYSTOP ?Apply 1 application topically 3 (three) times daily. ?  ?omeprazole 40 MG capsule ?Commonly known as: PRILOSEC ?Take 1 capsule (40 mg total) by mouth daily. ?  ?polyethylene glycol 17 g packet ?Commonly known as: MIRALAX /  GLYCOLAX ?Take 17 g by mouth daily. ?  ?simvastatin 40 MG tablet ?Commonly known as: ZOCOR ?Take 1 tablet (40 mg total) by mouth daily at 6 PM. ?What changed:  ?how much to take ?how to take this ?when to take this ?additional instructions ?Changed by: Worthy Rancher, MD ?  ? ?  ? ? ? ?Objective:  ? ?BP 111/73   Pulse 88   Ht 5' (1.524 m)   Wt 176 lb (79.8 kg)   SpO2 97%   BMI 34.37 kg/m?   ?Wt Readings from Last 3 Encounters:  ?05/24/21 176 lb (79.8 kg)  ?01/19/21 176 lb (79.8 kg)  ?12/17/20 172 lb (78 kg)  ?  ?Physical Exam ?Vitals and nursing note reviewed.  ?Constitutional:   ?   General: He is not in acute distress. ?   Appearance: He is well-developed. He is not diaphoretic.  ?Eyes:  ?   General: No scleral icterus. ?   Conjunctiva/sclera: Conjunctivae normal.  ?Neck:  ?   Thyroid: No thyromegaly.  ?Cardiovascular:  ?   Rate and Rhythm: Normal rate and regular rhythm.  ?   Heart sounds: Normal heart sounds. No murmur heard. ?Pulmonary:  ?   Effort: Pulmonary effort is normal. No respiratory distress.  ?   Breath sounds: Normal breath sounds. No wheezing.  ?Musculoskeletal:     ?   General: Normal range of motion.  ?Skin: ?   General: Skin is warm and dry.  ?   Findings: No rash.  ?Neurological:  ?   Mental Status: He is alert. He is disoriented.  ?   Sensory: No sensory deficit.  ?   Motor: No weakness.  ?   Coordination: Coordination normal.  ?Psychiatric:     ?   Behavior: Behavior normal.  ? ? ? ? ?Assessment & Plan:  ? ?Problem List Items Addressed This Visit   ? ?  ? Digestive  ? GERD (gastroesophageal reflux disease)  ? Relevant Medications  ? omeprazole (PRILOSEC) 40 MG capsule  ?  ? Nervous and Auditory  ? Early onset Alzheimer's dementia without behavioral disturbance ([place])  ?  ? Genitourinary  ? CKD (chronic kidney disease) stage 3, GFR 30-59 ml/min (HCC) - Primary (Chronic)  ?  ? Other  ? Hyperlipidemia  ? Relevant Medications  ? simvastatin (ZOCOR) 40 MG tablet  ?  ?Blood work looked good  from his nephrologist office.  Except for his renal function was slightly worse.  Everything else looked good.  He will follow-up with nephrology for that. ? ?Follow-up in 6 months with [place]. ? ?Instructed family that if his dementia worsens and they need more support or help that we could always do palliative or hospice care but right now she does have some caregivers coming to the house overnights that she is not alone ?Follow up plan: ?Return in about 6 months (  around 11/24/2021), or if symptoms worsen or fail to improve, for Physical and recheck cholesterol. ? ?Counseling provided for all of the vaccine components ?No orders of the defined types were placed in this encounter. ? ? ?Caryl Pina, MD ?[place] ?05/24/2021, 9:47 AM ? ? ? ? ?

## 2021-05-24 NOTE — Addendum Note (Signed)
Addended by: Alphonzo Dublin on: 05/24/2021 10:17 AM ? ? Modules accepted: Orders ? ?

## 2021-06-03 DIAGNOSIS — R338 Other retention of urine: Secondary | ICD-10-CM | POA: Diagnosis not present

## 2021-06-03 DIAGNOSIS — D649 Anemia, unspecified: Secondary | ICD-10-CM | POA: Diagnosis not present

## 2021-06-03 DIAGNOSIS — Z9181 History of falling: Secondary | ICD-10-CM | POA: Diagnosis not present

## 2021-06-03 DIAGNOSIS — E785 Hyperlipidemia, unspecified: Secondary | ICD-10-CM | POA: Diagnosis not present

## 2021-06-03 DIAGNOSIS — Z466 Encounter for fitting and adjustment of urinary device: Secondary | ICD-10-CM | POA: Diagnosis not present

## 2021-06-03 DIAGNOSIS — Z7982 Long term (current) use of aspirin: Secondary | ICD-10-CM | POA: Diagnosis not present

## 2021-06-03 DIAGNOSIS — N131 Hydronephrosis with ureteral stricture, not elsewhere classified: Secondary | ICD-10-CM | POA: Diagnosis not present

## 2021-06-03 DIAGNOSIS — Z435 Encounter for attention to cystostomy: Secondary | ICD-10-CM | POA: Diagnosis not present

## 2021-06-05 DIAGNOSIS — N170 Acute kidney failure with tubular necrosis: Secondary | ICD-10-CM | POA: Diagnosis not present

## 2021-06-05 DIAGNOSIS — I129 Hypertensive chronic kidney disease with stage 1 through stage 4 chronic kidney disease, or unspecified chronic kidney disease: Secondary | ICD-10-CM | POA: Diagnosis not present

## 2021-06-05 DIAGNOSIS — R809 Proteinuria, unspecified: Secondary | ICD-10-CM | POA: Diagnosis not present

## 2021-06-05 DIAGNOSIS — I950 Idiopathic hypotension: Secondary | ICD-10-CM | POA: Diagnosis not present

## 2021-06-05 DIAGNOSIS — N1832 Chronic kidney disease, stage 3b: Secondary | ICD-10-CM | POA: Diagnosis not present

## 2021-06-07 DIAGNOSIS — R338 Other retention of urine: Secondary | ICD-10-CM | POA: Diagnosis not present

## 2021-06-11 DIAGNOSIS — E785 Hyperlipidemia, unspecified: Secondary | ICD-10-CM | POA: Diagnosis not present

## 2021-06-11 DIAGNOSIS — Z435 Encounter for attention to cystostomy: Secondary | ICD-10-CM | POA: Diagnosis not present

## 2021-06-11 DIAGNOSIS — Z9181 History of falling: Secondary | ICD-10-CM | POA: Diagnosis not present

## 2021-06-11 DIAGNOSIS — Z7982 Long term (current) use of aspirin: Secondary | ICD-10-CM | POA: Diagnosis not present

## 2021-06-11 DIAGNOSIS — R338 Other retention of urine: Secondary | ICD-10-CM | POA: Diagnosis not present

## 2021-06-11 DIAGNOSIS — Z466 Encounter for fitting and adjustment of urinary device: Secondary | ICD-10-CM | POA: Diagnosis not present

## 2021-06-11 DIAGNOSIS — N131 Hydronephrosis with ureteral stricture, not elsewhere classified: Secondary | ICD-10-CM | POA: Diagnosis not present

## 2021-06-11 DIAGNOSIS — D649 Anemia, unspecified: Secondary | ICD-10-CM | POA: Diagnosis not present

## 2021-06-14 DIAGNOSIS — R338 Other retention of urine: Secondary | ICD-10-CM | POA: Diagnosis not present

## 2021-06-15 DIAGNOSIS — M79676 Pain in unspecified toe(s): Secondary | ICD-10-CM | POA: Diagnosis not present

## 2021-06-15 DIAGNOSIS — B351 Tinea unguium: Secondary | ICD-10-CM | POA: Diagnosis not present

## 2021-07-01 DIAGNOSIS — N131 Hydronephrosis with ureteral stricture, not elsewhere classified: Secondary | ICD-10-CM | POA: Diagnosis not present

## 2021-07-01 DIAGNOSIS — E785 Hyperlipidemia, unspecified: Secondary | ICD-10-CM | POA: Diagnosis not present

## 2021-07-01 DIAGNOSIS — Z466 Encounter for fitting and adjustment of urinary device: Secondary | ICD-10-CM | POA: Diagnosis not present

## 2021-07-01 DIAGNOSIS — Z9181 History of falling: Secondary | ICD-10-CM | POA: Diagnosis not present

## 2021-07-01 DIAGNOSIS — R338 Other retention of urine: Secondary | ICD-10-CM | POA: Diagnosis not present

## 2021-07-01 DIAGNOSIS — Z7982 Long term (current) use of aspirin: Secondary | ICD-10-CM | POA: Diagnosis not present

## 2021-07-01 DIAGNOSIS — Z435 Encounter for attention to cystostomy: Secondary | ICD-10-CM | POA: Diagnosis not present

## 2021-07-01 DIAGNOSIS — D649 Anemia, unspecified: Secondary | ICD-10-CM | POA: Diagnosis not present

## 2021-07-12 DIAGNOSIS — I129 Hypertensive chronic kidney disease with stage 1 through stage 4 chronic kidney disease, or unspecified chronic kidney disease: Secondary | ICD-10-CM | POA: Diagnosis not present

## 2021-07-12 DIAGNOSIS — I950 Idiopathic hypotension: Secondary | ICD-10-CM | POA: Diagnosis not present

## 2021-07-12 DIAGNOSIS — N1832 Chronic kidney disease, stage 3b: Secondary | ICD-10-CM | POA: Diagnosis not present

## 2021-07-12 DIAGNOSIS — R809 Proteinuria, unspecified: Secondary | ICD-10-CM | POA: Diagnosis not present

## 2021-07-12 DIAGNOSIS — N170 Acute kidney failure with tubular necrosis: Secondary | ICD-10-CM | POA: Diagnosis not present

## 2021-07-16 DIAGNOSIS — N170 Acute kidney failure with tubular necrosis: Secondary | ICD-10-CM | POA: Diagnosis not present

## 2021-07-16 DIAGNOSIS — E870 Hyperosmolality and hypernatremia: Secondary | ICD-10-CM | POA: Diagnosis not present

## 2021-07-16 DIAGNOSIS — I129 Hypertensive chronic kidney disease with stage 1 through stage 4 chronic kidney disease, or unspecified chronic kidney disease: Secondary | ICD-10-CM | POA: Diagnosis not present

## 2021-07-16 DIAGNOSIS — R718 Other abnormality of red blood cells: Secondary | ICD-10-CM | POA: Diagnosis not present

## 2021-07-16 DIAGNOSIS — R809 Proteinuria, unspecified: Secondary | ICD-10-CM | POA: Diagnosis not present

## 2021-07-16 DIAGNOSIS — N1832 Chronic kidney disease, stage 3b: Secondary | ICD-10-CM | POA: Diagnosis not present

## 2021-07-29 DIAGNOSIS — N131 Hydronephrosis with ureteral stricture, not elsewhere classified: Secondary | ICD-10-CM | POA: Diagnosis not present

## 2021-07-29 DIAGNOSIS — Z9181 History of falling: Secondary | ICD-10-CM | POA: Diagnosis not present

## 2021-07-29 DIAGNOSIS — E785 Hyperlipidemia, unspecified: Secondary | ICD-10-CM | POA: Diagnosis not present

## 2021-07-29 DIAGNOSIS — Z466 Encounter for fitting and adjustment of urinary device: Secondary | ICD-10-CM | POA: Diagnosis not present

## 2021-07-29 DIAGNOSIS — R338 Other retention of urine: Secondary | ICD-10-CM | POA: Diagnosis not present

## 2021-07-29 DIAGNOSIS — Z7982 Long term (current) use of aspirin: Secondary | ICD-10-CM | POA: Diagnosis not present

## 2021-07-29 DIAGNOSIS — Z435 Encounter for attention to cystostomy: Secondary | ICD-10-CM | POA: Diagnosis not present

## 2021-07-29 DIAGNOSIS — D649 Anemia, unspecified: Secondary | ICD-10-CM | POA: Diagnosis not present

## 2021-08-01 DIAGNOSIS — R338 Other retention of urine: Secondary | ICD-10-CM | POA: Diagnosis not present

## 2021-08-12 DIAGNOSIS — Z466 Encounter for fitting and adjustment of urinary device: Secondary | ICD-10-CM | POA: Diagnosis not present

## 2021-08-12 DIAGNOSIS — E785 Hyperlipidemia, unspecified: Secondary | ICD-10-CM | POA: Diagnosis not present

## 2021-08-12 DIAGNOSIS — Z9181 History of falling: Secondary | ICD-10-CM | POA: Diagnosis not present

## 2021-08-12 DIAGNOSIS — Z435 Encounter for attention to cystostomy: Secondary | ICD-10-CM | POA: Diagnosis not present

## 2021-08-12 DIAGNOSIS — D649 Anemia, unspecified: Secondary | ICD-10-CM | POA: Diagnosis not present

## 2021-08-12 DIAGNOSIS — N131 Hydronephrosis with ureteral stricture, not elsewhere classified: Secondary | ICD-10-CM | POA: Diagnosis not present

## 2021-08-12 DIAGNOSIS — Z7982 Long term (current) use of aspirin: Secondary | ICD-10-CM | POA: Diagnosis not present

## 2021-08-12 DIAGNOSIS — R338 Other retention of urine: Secondary | ICD-10-CM | POA: Diagnosis not present

## 2021-08-26 DIAGNOSIS — N3000 Acute cystitis without hematuria: Secondary | ICD-10-CM | POA: Diagnosis not present

## 2021-08-26 DIAGNOSIS — R339 Retention of urine, unspecified: Secondary | ICD-10-CM | POA: Diagnosis not present

## 2021-09-06 DIAGNOSIS — Z466 Encounter for fitting and adjustment of urinary device: Secondary | ICD-10-CM | POA: Diagnosis not present

## 2021-09-06 DIAGNOSIS — N131 Hydronephrosis with ureteral stricture, not elsewhere classified: Secondary | ICD-10-CM | POA: Diagnosis not present

## 2021-09-06 DIAGNOSIS — R338 Other retention of urine: Secondary | ICD-10-CM | POA: Diagnosis not present

## 2021-09-06 DIAGNOSIS — Z9181 History of falling: Secondary | ICD-10-CM | POA: Diagnosis not present

## 2021-09-06 DIAGNOSIS — Z435 Encounter for attention to cystostomy: Secondary | ICD-10-CM | POA: Diagnosis not present

## 2021-09-06 DIAGNOSIS — E785 Hyperlipidemia, unspecified: Secondary | ICD-10-CM | POA: Diagnosis not present

## 2021-09-06 DIAGNOSIS — D649 Anemia, unspecified: Secondary | ICD-10-CM | POA: Diagnosis not present

## 2021-09-06 DIAGNOSIS — Z7982 Long term (current) use of aspirin: Secondary | ICD-10-CM | POA: Diagnosis not present

## 2021-09-28 DIAGNOSIS — B351 Tinea unguium: Secondary | ICD-10-CM | POA: Diagnosis not present

## 2021-09-28 DIAGNOSIS — M79676 Pain in unspecified toe(s): Secondary | ICD-10-CM | POA: Diagnosis not present

## 2021-09-29 ENCOUNTER — Other Ambulatory Visit (HOSPITAL_COMMUNITY)
Admission: RE | Admit: 2021-09-29 | Discharge: 2021-09-29 | Disposition: A | Discharge status: Home / Self Care | Payer: Medicare Other | Source: Ambulatory Visit | Attending: Nephrology | Admitting: Nephrology

## 2021-09-29 DIAGNOSIS — I129 Hypertensive chronic kidney disease with stage 1 through stage 4 chronic kidney disease, or unspecified chronic kidney disease: Secondary | ICD-10-CM | POA: Diagnosis not present

## 2021-09-29 DIAGNOSIS — R809 Proteinuria, unspecified: Secondary | ICD-10-CM | POA: Diagnosis not present

## 2021-09-29 DIAGNOSIS — N170 Acute kidney failure with tubular necrosis: Secondary | ICD-10-CM | POA: Insufficient documentation

## 2021-09-29 DIAGNOSIS — E870 Hyperosmolality and hypernatremia: Secondary | ICD-10-CM | POA: Diagnosis not present

## 2021-09-29 DIAGNOSIS — N1832 Chronic kidney disease, stage 3b: Secondary | ICD-10-CM | POA: Diagnosis not present

## 2021-09-29 DIAGNOSIS — D7589 Other specified diseases of blood and blood-forming organs: Secondary | ICD-10-CM | POA: Diagnosis not present

## 2021-09-29 LAB — CBC
HCT: 46.8 % (ref 39.0–52.0)
Hemoglobin: 15.9 g/dL (ref 13.0–17.0)
MCH: 34.1 pg — ABNORMAL HIGH (ref 26.0–34.0)
MCHC: 34 g/dL (ref 30.0–36.0)
MCV: 100.4 fL — ABNORMAL HIGH (ref 80.0–100.0)
Platelets: 193 10*3/uL (ref 150–400)
RBC: 4.66 MIL/uL (ref 4.22–5.81)
RDW: 13.7 % (ref 11.5–15.5)
WBC: 5.4 10*3/uL (ref 4.0–10.5)
nRBC: 0 % (ref 0.0–0.2)

## 2021-09-29 LAB — RENAL FUNCTION PANEL
Albumin: 3.6 g/dL (ref 3.5–5.0)
Anion gap: 4 — ABNORMAL LOW (ref 5–15)
BUN: 28 mg/dL — ABNORMAL HIGH (ref 8–23)
CO2: 28 mmol/L (ref 22–32)
Calcium: 8.8 mg/dL — ABNORMAL LOW (ref 8.9–10.3)
Chloride: 109 mmol/L (ref 98–111)
Creatinine, Ser: 1.76 mg/dL — ABNORMAL HIGH (ref 0.61–1.24)
GFR, Estimated: 43 mL/min — ABNORMAL LOW (ref >60)
Glucose, Bld: 98 mg/dL (ref 70–99)
Phosphorus: 3 mg/dL (ref 2.5–4.6)
Potassium: 4.3 mmol/L (ref 3.5–5.1)
Sodium: 141 mmol/L (ref 135–145)

## 2021-09-29 LAB — PROTEIN / CREATININE RATIO, URINE
Creatinine, Urine: 81.5 mg/dL
Protein Creatinine Ratio: 0.2 mg/mg{Cre} — ABNORMAL HIGH (ref 0.00–0.15)
Total Protein, Urine: 16 mg/dL

## 2021-09-30 LAB — PTH, INTACT AND CALCIUM
Calcium, Total (PTH): 9.1 mg/dL (ref 8.6–10.2)
PTH: 29 pg/mL (ref 15–65)

## 2021-10-12 DIAGNOSIS — Z435 Encounter for attention to cystostomy: Secondary | ICD-10-CM | POA: Diagnosis not present

## 2021-10-12 DIAGNOSIS — N131 Hydronephrosis with ureteral stricture, not elsewhere classified: Secondary | ICD-10-CM | POA: Diagnosis not present

## 2021-10-12 DIAGNOSIS — R338 Other retention of urine: Secondary | ICD-10-CM | POA: Diagnosis not present

## 2021-10-12 DIAGNOSIS — Z466 Encounter for fitting and adjustment of urinary device: Secondary | ICD-10-CM | POA: Diagnosis not present

## 2021-10-12 DIAGNOSIS — D649 Anemia, unspecified: Secondary | ICD-10-CM | POA: Diagnosis not present

## 2021-10-12 DIAGNOSIS — Z9181 History of falling: Secondary | ICD-10-CM | POA: Diagnosis not present

## 2021-10-12 DIAGNOSIS — E785 Hyperlipidemia, unspecified: Secondary | ICD-10-CM | POA: Diagnosis not present

## 2021-10-12 DIAGNOSIS — Z7982 Long term (current) use of aspirin: Secondary | ICD-10-CM | POA: Diagnosis not present

## 2021-11-10 DIAGNOSIS — R338 Other retention of urine: Secondary | ICD-10-CM | POA: Diagnosis not present

## 2021-11-10 DIAGNOSIS — Z9181 History of falling: Secondary | ICD-10-CM | POA: Diagnosis not present

## 2021-11-10 DIAGNOSIS — E785 Hyperlipidemia, unspecified: Secondary | ICD-10-CM | POA: Diagnosis not present

## 2021-11-10 DIAGNOSIS — Z7982 Long term (current) use of aspirin: Secondary | ICD-10-CM | POA: Diagnosis not present

## 2021-11-10 DIAGNOSIS — Z466 Encounter for fitting and adjustment of urinary device: Secondary | ICD-10-CM | POA: Diagnosis not present

## 2021-11-10 DIAGNOSIS — N131 Hydronephrosis with ureteral stricture, not elsewhere classified: Secondary | ICD-10-CM | POA: Diagnosis not present

## 2021-11-10 DIAGNOSIS — Z435 Encounter for attention to cystostomy: Secondary | ICD-10-CM | POA: Diagnosis not present

## 2021-11-10 DIAGNOSIS — D649 Anemia, unspecified: Secondary | ICD-10-CM | POA: Diagnosis not present

## 2021-11-16 DIAGNOSIS — R338 Other retention of urine: Secondary | ICD-10-CM | POA: Diagnosis not present

## 2021-11-19 ENCOUNTER — Ambulatory Visit
Admission: EM | Admit: 2021-11-19 | Discharge: 2021-11-19 | Disposition: A | Discharge status: Other Acute Inpatient Hospital | Payer: Medicare Other | Attending: Nurse Practitioner | Admitting: Nurse Practitioner

## 2021-11-19 ENCOUNTER — Emergency Department (HOSPITAL_COMMUNITY): Payer: Medicare Other

## 2021-11-19 ENCOUNTER — Emergency Department (HOSPITAL_COMMUNITY)
Admission: EM | Admit: 2021-11-19 | Discharge: 2021-11-19 | Disposition: A | Discharge status: Home / Self Care | Payer: Medicare Other | Attending: Emergency Medicine | Admitting: Emergency Medicine

## 2021-11-19 ENCOUNTER — Encounter: Payer: Self-pay | Admitting: Emergency Medicine

## 2021-11-19 DIAGNOSIS — R4182 Altered mental status, unspecified: Secondary | ICD-10-CM | POA: Insufficient documentation

## 2021-11-19 DIAGNOSIS — M79604 Pain in right leg: Secondary | ICD-10-CM | POA: Diagnosis not present

## 2021-11-19 DIAGNOSIS — R6889 Other general symptoms and signs: Secondary | ICD-10-CM | POA: Diagnosis not present

## 2021-11-19 DIAGNOSIS — I959 Hypotension, unspecified: Secondary | ICD-10-CM | POA: Diagnosis not present

## 2021-11-19 DIAGNOSIS — R404 Transient alteration of awareness: Secondary | ICD-10-CM | POA: Diagnosis not present

## 2021-11-19 DIAGNOSIS — K429 Umbilical hernia without obstruction or gangrene: Secondary | ICD-10-CM | POA: Diagnosis not present

## 2021-11-19 DIAGNOSIS — Z978 Presence of other specified devices: Secondary | ICD-10-CM | POA: Diagnosis not present

## 2021-11-19 DIAGNOSIS — R0682 Tachypnea, not elsewhere classified: Secondary | ICD-10-CM | POA: Diagnosis not present

## 2021-11-19 DIAGNOSIS — R000 Tachycardia, unspecified: Secondary | ICD-10-CM | POA: Diagnosis not present

## 2021-11-19 DIAGNOSIS — N1330 Unspecified hydronephrosis: Secondary | ICD-10-CM | POA: Diagnosis not present

## 2021-11-19 DIAGNOSIS — N183 Chronic kidney disease, stage 3 unspecified: Secondary | ICD-10-CM | POA: Diagnosis not present

## 2021-11-19 DIAGNOSIS — G309 Alzheimer's disease, unspecified: Secondary | ICD-10-CM | POA: Insufficient documentation

## 2021-11-19 DIAGNOSIS — D72829 Elevated white blood cell count, unspecified: Secondary | ICD-10-CM | POA: Diagnosis not present

## 2021-11-19 DIAGNOSIS — Z743 Need for continuous supervision: Secondary | ICD-10-CM | POA: Diagnosis not present

## 2021-11-19 DIAGNOSIS — K8020 Calculus of gallbladder without cholecystitis without obstruction: Secondary | ICD-10-CM | POA: Diagnosis not present

## 2021-11-19 DIAGNOSIS — Z7982 Long term (current) use of aspirin: Secondary | ICD-10-CM | POA: Insufficient documentation

## 2021-11-19 DIAGNOSIS — N3001 Acute cystitis with hematuria: Secondary | ICD-10-CM | POA: Insufficient documentation

## 2021-11-19 DIAGNOSIS — M549 Dorsalgia, unspecified: Secondary | ICD-10-CM | POA: Diagnosis not present

## 2021-11-19 DIAGNOSIS — K4090 Unilateral inguinal hernia, without obstruction or gangrene, not specified as recurrent: Secondary | ICD-10-CM | POA: Diagnosis not present

## 2021-11-19 LAB — URINALYSIS, ROUTINE W REFLEX MICROSCOPIC
Bacteria, UA: NONE SEEN
Bilirubin Urine: NEGATIVE
Glucose, UA: NEGATIVE mg/dL
Ketones, ur: NEGATIVE mg/dL
Nitrite: NEGATIVE
Protein, ur: >=300 mg/dL — AB
RBC / HPF: >50 RBC/hpf — ABNORMAL HIGH (ref 0–5)
Specific Gravity, Urine: 1.01 (ref 1.005–1.030)
WBC, UA: >50 WBC/hpf — ABNORMAL HIGH (ref 0–5)
pH: 7 (ref 5.0–8.0)

## 2021-11-19 LAB — CBC WITH DIFFERENTIAL/PLATELET
Abs Immature Granulocytes: 0.04 10*3/uL (ref 0.00–0.07)
Basophils Absolute: 0 10*3/uL (ref 0.0–0.1)
Basophils Relative: 0 %
Eosinophils Absolute: 0.3 10*3/uL (ref 0.0–0.5)
Eosinophils Relative: 3 %
HCT: 47.6 % (ref 39.0–52.0)
Hemoglobin: 16.4 g/dL (ref 13.0–17.0)
Immature Granulocytes: 0 %
Lymphocytes Relative: 5 %
Lymphs Abs: 0.5 10*3/uL — ABNORMAL LOW (ref 0.7–4.0)
MCH: 34 pg (ref 26.0–34.0)
MCHC: 34.5 g/dL (ref 30.0–36.0)
MCV: 98.8 fL (ref 80.0–100.0)
Monocytes Absolute: 0.7 10*3/uL (ref 0.1–1.0)
Monocytes Relative: 6 %
Neutro Abs: 9.4 10*3/uL — ABNORMAL HIGH (ref 1.7–7.7)
Neutrophils Relative %: 86 %
Platelets: 211 10*3/uL (ref 150–400)
RBC: 4.82 MIL/uL (ref 4.22–5.81)
RDW: 13.2 % (ref 11.5–15.5)
WBC: 11 10*3/uL — ABNORMAL HIGH (ref 4.0–10.5)
nRBC: 0 % (ref 0.0–0.2)

## 2021-11-19 LAB — COMPREHENSIVE METABOLIC PANEL
ALT: 31 U/L (ref 0–44)
AST: 25 U/L (ref 15–41)
Albumin: 3.7 g/dL (ref 3.5–5.0)
Alkaline Phosphatase: 81 U/L (ref 38–126)
Anion gap: 7 (ref 5–15)
BUN: 31 mg/dL — ABNORMAL HIGH (ref 8–23)
CO2: 25 mmol/L (ref 22–32)
Calcium: 9.1 mg/dL (ref 8.9–10.3)
Chloride: 107 mmol/L (ref 98–111)
Creatinine, Ser: 1.92 mg/dL — ABNORMAL HIGH (ref 0.61–1.24)
GFR, Estimated: 39 mL/min — ABNORMAL LOW (ref >60)
Glucose, Bld: 129 mg/dL — ABNORMAL HIGH (ref 70–99)
Potassium: 4.3 mmol/L (ref 3.5–5.1)
Sodium: 139 mmol/L (ref 135–145)
Total Bilirubin: 1.1 mg/dL (ref 0.3–1.2)
Total Protein: 7.9 g/dL (ref 6.5–8.1)

## 2021-11-19 LAB — LACTIC ACID, PLASMA
Lactic Acid, Venous: 1.3 mmol/L (ref 0.5–1.9)
Lactic Acid, Venous: 1.5 mmol/L (ref 0.5–1.9)

## 2021-11-19 MED ORDER — SODIUM CHLORIDE 0.9 % IV SOLN
1.0000 g | INTRAVENOUS | Status: DC
  Administered 2021-11-19: 1 g via INTRAVENOUS
  Filled 2021-11-19: qty 10

## 2021-11-19 MED ORDER — ACETAMINOPHEN 500 MG PO TABS
1000.0000 mg | ORAL_TABLET | Freq: Once | ORAL | Status: AC
  Administered 2021-11-19: 1000 mg via ORAL

## 2021-11-19 MED ORDER — CEPHALEXIN 500 MG PO CAPS: 500.0000 mg | ORAL_CAPSULE | Freq: Two times a day (BID) | ORAL | 0 refills | Status: AC

## 2021-11-19 MED ORDER — SODIUM CHLORIDE 0.9 % IV BOLUS
1000.0000 mL | Freq: Once | INTRAVENOUS | Status: AC
  Administered 2021-11-19: 1000 mL via INTRAVENOUS

## 2021-11-19 NOTE — ED Provider Notes (Signed)
[facility] EMERGENCY DEPARTMENT Provider Note   CSN: [id number] Arrival date & time: 11/19/21  1156     History  Chief Complaint  Patient presents with   Leg Pain    Frank Levine is a 61 y.o. male.  HPI   Patient with medical history including Alzheimer's, Down syndrome, CKD stage III, urinary strictures with suprapubic catheter in place presents  with complaints of altered mental status.  Patient's guardian is at bedside and provided HPI he states that while patient was at his adult care facility and they note that he had a fall and was acting slightly abnormal, he was sent to urgent care where they are concerned for his mental status and was then later sent here.  The caretaker notes that he is acting his normal self he is not noticing any abnormalities, the patient was not endorsing any problems to him, he states that the patient does have frequent UTIs and is possibly have a UTI at this time.  There is been no associated cough congestion stomach pains nausea vomiting diarrhea no other complaints.  He is on anticoag's.  Patient lives at home round-the-clock nursing staff, and attends adult daycare during the day.  Reviewed patient's note from urgent care they note that patient was hypotensive and slightly altered, his suprapubic catheter was clamped upon unclamping they noticed tea colored urine they provided him with fluids and called EMS for transfer to the emergency room.  Home Medications Prior to Admission medications   Medication Sig Start Date End Date Taking? Authorizing Provider  acetaminophen (TYLENOL) 325 MG tablet Take 650 mg by mouth every 6 (six) hours as needed.   Yes [provider]  aspirin EC 81 MG tablet Take 81 mg by mouth daily.    Yes [provider]  cephALEXin (KEFLEX) 500 MG capsule Take 1 capsule (500 mg total) by mouth 2 (two) times daily for 7 days. 11/19/21 11/26/21 Yes Marcello Fennel, PA-C  cetirizine (ZYRTEC) 5 MG tablet Take 5 mg  by mouth daily.   Yes [provider]  cholecalciferol (VITAMIN D3) 25 MCG (1000 UNIT) tablet Take 1,000 Units by mouth daily.   Yes [provider]  clotrimazole (LOTRIMIN) 1 % cream Apply 1 Application topically daily as needed (itching/inflamation).   Yes [provider]  diclofenac Sodium (VOLTAREN) 1 % GEL Apply 2 g topically daily as needed (pain).   Yes [provider]  docusate sodium (COLACE) 100 MG capsule Take 100 mg by mouth daily as needed for mild constipation.   Yes [provider]  iron polysaccharides (NIFEREX) 150 MG capsule Take 150 mg by mouth daily.   Yes [provider]  nystatin (MYCOSTATIN/NYSTOP) powder Apply 1 Application topically daily as needed (rash).   Yes [provider]  omeprazole (PRILOSEC) 40 MG capsule Take 1 capsule (40 mg total) by mouth daily. 05/24/21  Yes Dettinger, Fransisca Kaufmann, MD  polyethylene glycol (MIRALAX / GLYCOLAX) 17 g packet Take 17 g by mouth daily.   Yes [provider]  simvastatin (ZOCOR) 40 MG tablet Take 1 tablet (40 mg total) by mouth daily at 6 PM. 05/24/21  Yes Dettinger, Fransisca Kaufmann, MD      Allergies    Patient has no known allergies.    Review of Systems   Review of Systems  Unable to perform ROS: Dementia    Physical Exam Updated Vital Signs BP 120/73   Pulse (!) 115   Temp 99.4 F (37.4 C) (  Rectal)   Resp (!) 22   Wt 86 kg   SpO2 100%   BMI 37.01 kg/m  Physical Exam Vitals and nursing note reviewed.  Constitutional:      General: He is not in acute distress.    Appearance: He is not ill-appearing.  HENT:     Head: Normocephalic and atraumatic.     Comments: No deformity  of the head present no raccoon eyes or battle sign noted.    Nose: No congestion.     Mouth/Throat:     Mouth: Mucous membranes are moist.     Pharynx: Oropharynx is clear. No oropharyngeal exudate or posterior oropharyngeal erythema.     Comments: No trismus no torticollis no  oral trauma present. Eyes:     Extraocular Movements: Extraocular movements intact.     Conjunctiva/sclera: Conjunctivae normal.     Pupils: Pupils are equal, round, and reactive to light.  Cardiovascular:     Rate and Rhythm: Regular rhythm. Tachycardia present.     Pulses: Normal pulses.     Heart sounds: No murmur heard.    No friction rub. No gallop.  Pulmonary:     Effort: No respiratory distress.     Breath sounds: No wheezing, rhonchi or rales.     Comments: No evidence of respiratory distress nontachypneic nonhypoxic speaking full sentences out difficulty lung sounds are clear bilaterally. Abdominal:     Palpations: Abdomen is soft.     Tenderness: There is no abdominal tenderness. There is no right CVA tenderness or left CVA tenderness.     Comments: Abdomen soft, nondistended, nontender suprapubic catheter in place no surrounding erythema or edema no evidence of infection present.  Genitourinary:    Comments: Foley bag had dark-colored urine slightly reddish tinge to it. Musculoskeletal:     Comments: Spine was palpated was nontender to palpation no step-off or deformities noted, no pelvis instability no leg shortening, he no tenderness in his upper and or lower extremities, 2+ radial pulses 2+ dorsal pedal pulses.  Skin:    General: Skin is warm and dry.  Neurological:     Mental Status: He is alert.     GCS: GCS eye subscore is 4. GCS verbal subscore is 5. GCS motor subscore is 6.     Cranial Nerves: Cranial nerves 2-12 are intact.     Motor: No weakness.     Comments: Cranial nerves II through XII grossly intact, no unilateral weakness, able to follow two-step commands, no slurring of his words.  Psychiatric:        Mood and Affect: Mood normal.     ED Results / Procedures / Treatments   Labs (all labs ordered are listed, but only abnormal results are displayed) Labs Reviewed  COMPREHENSIVE METABOLIC PANEL - Abnormal; Notable for the following components:       Result Value   Glucose, Bld 129 (*)    BUN 31 (*)    Creatinine, Ser 1.92 (*)    GFR, Estimated 39 (*)    All other components within normal limits  CBC WITH DIFFERENTIAL/PLATELET - Abnormal; Notable for the following components:   WBC 11.0 (*)    Neutro Abs 9.4 (*)    Lymphs Abs 0.5 (*)    All other components within normal limits  URINALYSIS, ROUTINE W REFLEX MICROSCOPIC - Abnormal; Notable for the following components:   APPearance CLOUDY (*)    Hgb urine dipstick LARGE (*)    Protein, ur >=300 (*)  Leukocytes,Ua LARGE (*)    RBC / HPF >50 (*)    WBC, UA >50 (*)    All other components within normal limits  URINE CULTURE  LACTIC ACID, PLASMA  LACTIC ACID, PLASMA    EKG None  Radiology CT Renal Stone Study  Result Date: 11/19/2021 CLINICAL DATA:  Flank pain, hydronephrosis EXAM: CT ABDOMEN AND PELVIS WITHOUT CONTRAST TECHNIQUE: Multidetector CT imaging of the abdomen and pelvis was performed following the standard protocol without IV contrast. RADIATION DOSE REDUCTION: This exam was performed according to the departmental dose-optimization program which includes automated exposure control, adjustment of the mA and/or kV according to patient size and/or use of iterative reconstruction technique. COMPARISON:  07/05/2019 FINDINGS: Lower chest: Minimal pericardial effusion is seen. Breathing motion limits evaluation of lower lung fields. Hepatobiliary: No focal abnormalities are seen in liver. There is no dilation of bile ducts. Gallbladder stones are seen. Pancreas: There is fatty infiltration. No focal abnormalities are seen. Spleen: Unremarkable. Adrenals/Urinary Tract: Adrenals are unremarkable. There is marked dilation of renal pelves in both kidneys. There is interval worsening of dilation of left renal pelvis. Ureter is dilated and tortuous. There is diffuse wall thickening in the urinary bladder. Suprapubic cystostomy catheter is noted in place. Stomach/Bowel: Stomach is not  distended. Small bowel loops are not dilated. Appendix is not seen. There is no pericecal inflammation. Cecum is higher than usual in position. There is no significant wall thickening in colon. There is no pericolic stranding. Vascular/Lymphatic: Unremarkable. Reproductive: Unremarkable. Other: There is no ascites or pneumoperitoneum. Umbilical and paraumbilical hernias containing fat are seen. Left inguinal hernia containing fat is seen. Musculoskeletal: There is minimal anterolisthesis at L3-L4 level and minimal retrolisthesis at L1-L2 level. Degenerative changes are noted in facet joints. No recent fracture is seen. IMPRESSION: Bilateral marked hydronephrosis with interval worsening on the left side. There are no renal or ureteral stones. There is diffuse wall thickening in the urinary bladder which may be due to chronic cystitis. Suprapubic cystostomy catheter is noted in place. There is no evidence of intestinal obstruction or pneumoperitoneum. Gallbladder stones.  There is no dilation of bile ducts. Ventral and umbilical hernias containing fat. Left inguinal hernia containing fat. Lumbar spondylosis. Electronically Signed   By: Elmer Picker M.D.   On: 11/19/2021 17:19   DG Chest Portable 1 View  Result Date: 11/19/2021 CLINICAL DATA:  Tachypnea EXAM: PORTABLE CHEST 1 VIEW COMPARISON:  Portable exam 1400 hours compared to 07/05/2019 FINDINGS: Upper normal size of cardiac silhouette. Mediastinal contours and pulmonary vascularity normal. Lungs clear. No pulmonary infiltrate, pleural effusion, or pneumothorax. Bones demineralized with old healed fracture at posterolateral RIGHT seventh rib. IMPRESSION: No acute abnormalities. Electronically Signed   By: Lavonia Dana M.D.   On: 11/19/2021 14:08    Procedures Procedures    Medications Ordered in ED Medications  cefTRIAXone (ROCEPHIN) 1 g in sodium chloride 0.9 % 100 mL IVPB (1 g Intravenous New Bag/Given 11/19/21 1831)  sodium chloride 0.9 % bolus  1,000 mL (1,000 mLs Intravenous Bolus 11/19/21 1555)    ED Course/ Medical Decision Making/ A&P                           Medical Decision Making Amount and/or Complexity of Data Reviewed Labs: ordered. Radiology: ordered.   This patient presents to the ED for concern of altered mental status, this involves an extensive number of treatment options, and is a complaint that carries with it  a high risk of complications and morbidity.  The differential diagnosis includes sepsis, electrode derailments, CVA    Additional history obtained:  Additional history obtained from care giver at bedside External records from outside source obtained and reviewed including nephrology notes   Co morbidities that complicate the patient evaluation  Urinary strictures  Social Determinants of Health:  Cherlyn Cushing, Alzheimer's    Lab Tests:  I Ordered, and personally interpreted labs.  The pertinent results include: CBC  shows slight leukocytosis of 11, CMP shows glucose of 129, BUN 31, creatinine 1.9, lactic 1.3, second lactic 1.5, UA shows large leukocytes red blood cells white blood cells no bacteria squamous cells   Imaging Studies ordered:  I ordered imaging studies including chest x-ray, CT renal I independently visualized and interpreted imaging which showed no acute findings, CT renal reveals bilateral hydronephrosis, worse on the left side, no stone, with evidence consistent with chronic UTI. I agree with the radiologist interpretation   Cardiac Monitoring:  The patient was maintained on a cardiac monitor.  I personally viewed and interpreted the cardiac monitored which showed an underlying rhythm of: EKG without signs of ischemia   Medicines ordered and prescription drug management:  I ordered medication including ceftriaxone, fluids I have reviewed the patients home medicines and have made adjustments as needed  Critical Interventions:  N/A   Reevaluation:  Presents with  concerns of altered mental status, on my exam he had a benign physical exam, he was noted be tachycardic, urine appears slightly infectious, will obtain basic lab work and reassess.  Patient has a slight AKI's and remains slightly tachycardic will provide with a liter of fluids.  UA seems consistent with UTI, likely cause of his tachycardia, due to the apparent blood in his urine and difficult historian will obtain CT renal for exclusion of pyelo-/septic stone.  Patient has bilateral hydronephrosis, suspect this is likely from his catheter being clamped, but due to this abnormal finding will speak with urology for further recommendations.  Updated patient as well as guardian this they are agreement this plan and ready for discharge.     Consultations Obtained:  I requested consultation with the urologist Dr. Abner Greenspan,  and discussed lab and imaging findings as well as pertinent plan - they recommend: Likely this is from the clamped suprapubic catheter, states as long as patient still having good urinary production, he can be discharged home, he would like him to follow-up in a week's time for repeat ultrasound to ensure that the hydronephrosis has resolved.    Test Considered:  N/A    Rule out Low suspicion for CVA or intracranial head bleed is no focal deficit present on exam, he is not on anticoag's, there is no evidence of traumatic injury to the head.  I have low suspicion for metabolic derailments lab was unremarkable.  I have low suspicion for for intra-abdominal abnormality as abdomen soft nontender no nausea or vomiting.  Low suspicion for internal trauma as there is evidence of trauma seen on patient's chest or abdomen, both which are nontender.  Low suspicion for spine fracture and/or spinal cord abnormality as spine is nontender, he has full range of motion, 5-5 strength in lower extremities, no red flag symptoms.  Low suspicion for pyelo-, kidney stone CT imaging negative these  findings, he does have noted bilateral hydronephrosis suspect this is secondary due to clamps suprapubic catheter which is since resolved after clamp was removed, since being the ER he has been having good urinary production.  I have low suspicion for any orthopedic injury as he has no tenderness on his upper or lower extremities, no deformities present my exam.  Is on the patient remains tachycardic, I reviewed patient's chart he appears to have a higher heart rate at baseline, I suspect the slight elevation could be secondary to UTI, he is nontoxic-appearing, he is at his baseline per his caregivers, he has not failed outpatient therapy and I feel he is stable enough for outpatient antibiotics.    Dispostion and problem list  After consideration of the diagnostic results and the patients response to treatment, I feel that the patent would benefit from discharge.  UTI-urine cultures were obtained, patient was given a gram of ceftriaxone, will send home with antibiotics, lab follow-up with urology for further evaluation of hydronephrosis. Hydronephrosis-likely from clamped suprapubic catheter, will have him follow-up with urology for further evaluation.            Final Clinical Impression(s) / ED Diagnoses Final diagnoses:  Acute cystitis with hematuria  Hydronephrosis, unspecified hydronephrosis type    Rx / DC Orders ED Discharge Orders          Ordered    cephALEXin (KEFLEX) 500 MG capsule  2 times daily        11/19/21 1849              Marcello Fennel, PA-C 11/19/21 1850    Sherwood Gambler, MD 11/26/21 [phone]

## 2021-11-19 NOTE — ED Notes (Signed)
Patient is being discharged from the Urgent Care and sent to the Emergency Department via EMS . Per Lollie Sails, patient is in need of higher level of care due to alter mental status, LOC, severe back pain. Patient is aware and verbalizes understanding of plan of care.  Vitals:   11/19/21 1118 11/19/21 1123  Pulse:  77  Resp:  20  Temp: 97.6 F (36.4 C)   SpO2:  98%

## 2021-11-19 NOTE — ED Provider Notes (Signed)
Patient presented to urgent care this morning with caregiver.  Caregiver provides history and reports altered mental status, loss of consciousness over the past couple of days.  In triage, patient is hypotensive, pale, in and out of responsiveness.  Patient complaining of back pain.  911 called.  IV started and bolus of normal saline initiated.    Medical history significant for early onset Alzheimer's, Down syndrome, hyperlipidemia, chronic kidney disease, urethral stricture, history of bilateral hydronephrosis.  Foley catheter in place prior to arrival noted to be clamped.  Catheter unclamped, attached to Foley bag.  Large amount of tea colored drainage noted draining into Foley bag.  Tylenol given for back pain.   Patient transported to hospital via EMS.  Patient left urgent care in stable condition.    Eulogio Bear, NP 11/19/21 1149

## 2021-11-19 NOTE — ED Notes (Signed)
Report given to Jinny Blossom, RN at [facility] ED.

## 2021-11-19 NOTE — ED Notes (Signed)
Unable to obtain palpable blood pressure with manuel or automatic. Pt became pale, increased lethargy, decreased level of consciousness. Provider aware and at bedside. EMS notified by RT. IV placed by RN. Normal Saline 1000 mg bolus initiated by NP.   NP gave verbal order to attach standard drainage bag to indwelling catheter. Pt tolerated well. Brown/tea colored urine noted to bag at time of EMS arrival.

## 2021-11-19 NOTE — ED Triage Notes (Signed)
Pt is present today with c/o LOC yesterday and today. Pt guardian states that he is off balance, back pain, constipation. Pt guardian states that the facility sx started x2 days ago

## 2021-11-19 NOTE — ED Notes (Signed)
Fluid bolus complete. Pts HR consistently staying in the 110-115 bpm--PA-C made aware

## 2021-11-19 NOTE — ED Triage Notes (Signed)
Pt arrived via [facility] from urgent care c/o decreased alertness/unconscious, Hx of Down Syndrome, back and R leg pain, pt came with a foley catheter. CBG 156 and had '1000mg'$  of tylenol at urgent care

## 2021-11-19 NOTE — ED Notes (Signed)
Patient and caregiver verbalize understanding of discharge instructions. Opportunity for questioning and answers were provided. Armband removed by staff, pt discharged from ED. Ambulated out to lobby with caregiver

## 2021-11-19 NOTE — Discharge Instructions (Signed)
You have a urinary tract infection of started on antibiotics please take as prescribed.  If he notes that his symptoms are worsening i.e. develop fevers chills becomes altered, please come back in for reassessment Hydronephrosis-there was some swelling around your kidneys, I suspect this is from urinary catheter being clamped, I would like you to follow-up with urology in a week's time for reassessment.  Come back to the emergency department if you develop chest pain, shortness of breath, severe abdominal pain, uncontrolled nausea, vomiting, diarrhea.

## 2021-11-21 LAB — URINE CULTURE: Culture: >=100000 — AB

## 2021-11-22 ENCOUNTER — Telehealth (HOSPITAL_BASED_OUTPATIENT_CLINIC_OR_DEPARTMENT_OTHER): Payer: Self-pay | Admitting: *Deleted

## 2021-11-22 NOTE — Telephone Encounter (Signed)
Post ED Visit - Positive Culture Follow-up  Culture report reviewed by antimicrobial stewardship pharmacist: [facility] Team '[]'$  Elenor Quinones, Pharm.D. '[]'$  Heide Guile, Pharm.D., BCPS AQ-ID '[]'$  Parks Neptune, Pharm.D., BCPS '[]'$  Alycia Rossetti, Pharm.D., BCPS '[]'$  Sylvester, Pharm.D., BCPS, AAHIVP '[]'$  Legrand Como, Pharm.D., BCPS, AAHIVP '[]'$  Salome Arnt, PharmD, BCPS '[]'$  Johnnette Gourd, PharmD, BCPS '[]'$  Hughes Better, PharmD, BCPS '[]'$  Leeroy Cha, PharmD '[]'$  Laqueta Linden, PharmD, BCPS '[]'$  Albertina Parr, PharmD  [facility] Team '[]'$  Leodis Sias, PharmD '[]'$  Lindell Spar, PharmD '[]'$  Royetta Asal, PharmD '[]'$  Graylin Shiver, Rph '[]'$  Rema Fendt) Glennon Mac, PharmD '[]'$  Arlyn Dunning, PharmD '[]'$  Netta Cedars, PharmD '[]'$  Dia Sitter, PharmD '[]'$  Leone Haven, PharmD '[]'$  Gretta Arab, PharmD '[]'$  Theodis Shove, PharmD '[]'$  Peggyann Juba, PharmD '[]'$  Reuel Boom, PharmD   Positive urine culture Treated with Cephalexin, organism sensitive to the same and no further patient follow-up is required at this time.  Luisa Hart, Pharm D  Harlon Flor Lah[place]a 11/22/2021, 9:40 AM

## 2021-11-24 ENCOUNTER — Ambulatory Visit (INDEPENDENT_AMBULATORY_CARE_PROVIDER_SITE_OTHER): Payer: Medicare Other | Admitting: Family Medicine

## 2021-11-24 ENCOUNTER — Encounter: Payer: Self-pay | Admitting: Family Medicine

## 2021-11-24 VITALS — BP 99/69 | HR 90 | Temp 98.2°F | Ht 60.0 in | Wt 176.0 lb

## 2021-11-24 DIAGNOSIS — Z0000 Encounter for general adult medical examination without abnormal findings: Secondary | ICD-10-CM | POA: Diagnosis not present

## 2021-11-24 DIAGNOSIS — N183 Chronic kidney disease, stage 3 unspecified: Secondary | ICD-10-CM

## 2021-11-24 DIAGNOSIS — E782 Mixed hyperlipidemia: Secondary | ICD-10-CM

## 2021-11-24 DIAGNOSIS — Z0001 Encounter for general adult medical examination with abnormal findings: Secondary | ICD-10-CM | POA: Diagnosis not present

## 2021-11-24 DIAGNOSIS — Q909 Down syndrome, unspecified: Secondary | ICD-10-CM

## 2021-11-24 DIAGNOSIS — Z23 Encounter for immunization: Secondary | ICD-10-CM

## 2021-11-24 MED ORDER — SIMVASTATIN 40 MG PO TABS: 40.0000 mg | ORAL_TABLET | Freq: Every day | ORAL | 3 refills | Status: DC

## 2021-11-24 NOTE — Progress Notes (Signed)
BP 99/69   Pulse 90   Temp 98.2 F (36.8 C)   Ht 5' (1.524 m)   Wt 176 lb (79.8 kg)   SpO2 98%   BMI 34.37 kg/m    Subjective:   Patient ID: Frank Levine, male    DOB: [birth year], 61 y.o.   MRN: [id number]  HPI: Frank Levine is a 61 y.o. male presenting on 11/24/2021 for Medical Management of Chronic Issues (CPE), Chronic Kidney Disease, and Hyperlipidemia   HPI Physical exam Patient denies any chest pain, shortness of breath, headaches or vision issues, abdominal complaints, diarrhea, nausea, vomiting, or joint issues.  He gets the occasional arthritis in his knee on the right side.  Patient is Down syndrome and Alzheimer's and has been stable.  Hyperlipidemia Patient is coming in for recheck of his hyperlipidemia. The patient is currently taking simvastatin. They deny any issues with myalgias or history of liver damage from it. They deny any focal numbness or weakness or chest pain.   CKD Sees nephrology for CKD.  Relevant past medical, surgical, family and social history reviewed and updated as indicated. Interim medical history since our last visit reviewed. Allergies and medications reviewed and updated.  Review of Systems  Constitutional:  Negative for chills and fever.  HENT:  Negative for ear pain and tinnitus.   Eyes:  Negative for pain and visual disturbance.  Respiratory:  Negative for cough, shortness of breath and wheezing.   Cardiovascular:  Negative for chest pain, palpitations and leg swelling.  Gastrointestinal:  Negative for abdominal pain, blood in stool, constipation and diarrhea.  Genitourinary:  Negative for dysuria and hematuria.  Musculoskeletal:  Negative for back pain, gait problem and myalgias.  Skin:  Negative for rash.  Neurological:  Negative for dizziness, weakness, light-headedness and headaches.  Psychiatric/Behavioral:  Positive for confusion. Negative for suicidal ideas.   All other systems reviewed and are negative.   Per HPI unless  specifically indicated above   Allergies as of 11/24/2021   No Known Allergies      Medication List        Accurate as of November 24, 2021 10:23 AM. If you have any questions, ask your nurse or doctor.          acetaminophen 325 MG tablet Commonly known as: TYLENOL Take 650 mg by mouth every 6 (six) hours as needed.   aspirin EC 81 MG tablet Take 81 mg by mouth daily.   cephALEXin 500 MG capsule Commonly known as: KEFLEX Take 1 capsule (500 mg total) by mouth 2 (two) times daily for 7 days.   cetirizine 5 MG tablet Commonly known as: ZYRTEC Take 5 mg by mouth daily.   cholecalciferol 25 MCG (1000 UNIT) tablet Commonly known as: VITAMIN D3 Take 1,000 Units by mouth daily.   clotrimazole 1 % cream Commonly known as: LOTRIMIN Apply 1 Application topically daily as needed (itching/inflamation).   diclofenac Sodium 1 % Gel Commonly known as: VOLTAREN Apply 2 g topically daily as needed (pain).   docusate sodium 100 MG capsule Commonly known as: COLACE Take 100 mg by mouth daily as needed for mild constipation.   iron polysaccharides 150 MG capsule Commonly known as: NIFEREX Take 150 mg by mouth daily.   nystatin powder Commonly known as: MYCOSTATIN/NYSTOP Apply 1 Application topically daily as needed (rash).   omeprazole 40 MG capsule Commonly known as: PRILOSEC Take 1 capsule (40 mg total) by mouth daily.   polyethylene glycol 17 g packet  Commonly known as: MIRALAX / GLYCOLAX Take 17 g by mouth daily.   simvastatin 40 MG tablet Commonly known as: ZOCOR Take 1 tablet (40 mg total) by mouth daily at 6 PM.         Objective:   BP 99/69   Pulse 90   Temp 98.2 F (36.8 C)   Ht 5' (1.524 m)   Wt 176 lb (79.8 kg)   SpO2 98%   BMI 34.37 kg/m   Wt Readings from Last 3 Encounters:  11/24/21 176 lb (79.8 kg)  11/19/21 189 lb 8 oz (86 kg)  05/24/21 176 lb (79.8 kg)    Physical Exam Vitals and nursing note reviewed.  Constitutional:       General: He is not in acute distress.    Appearance: Normal appearance. He is well-developed. He is obese. He is not diaphoretic.  HENT:     Right Ear: External ear normal.     Left Ear: External ear normal.     Nose: Nose normal.     Mouth/Throat:     Pharynx: No oropharyngeal exudate.  Eyes:     General: No scleral icterus.       Right eye: No discharge.     Conjunctiva/sclera: Conjunctivae normal.     Pupils: Pupils are equal, round, and reactive to light.  Neck:     Thyroid: No thyromegaly.  Cardiovascular:     Rate and Rhythm: Normal rate and regular rhythm.     Heart sounds: Normal heart sounds. No murmur heard. Pulmonary:     Effort: Pulmonary effort is normal. No respiratory distress.     Breath sounds: Normal breath sounds. No wheezing.  Abdominal:     General: Bowel sounds are normal. There is no distension.     Palpations: Abdomen is soft.     Tenderness: There is no abdominal tenderness. There is no guarding or rebound.  Musculoskeletal:        General: Normal range of motion.     Cervical back: Neck supple.  Lymphadenopathy:     Cervical: No cervical adenopathy.  Skin:    General: Skin is warm and dry.     Findings: No rash.  Neurological:     Mental Status: He is alert and oriented to person, place, and time.     Coordination: Coordination normal.  Psychiatric:        Behavior: Behavior normal.        Cognition and Memory: Memory is impaired.       Assessment & Plan:   Problem List Items Addressed This Visit       Genitourinary   CKD (chronic kidney disease) stage 3, GFR 30-59 ml/min (HCC) (Chronic)   Relevant Orders   CBC with Differential/Platelet   CMP14+EGFR   Lipid panel     Other   Hyperlipidemia   Relevant Medications   simvastatin (ZOCOR) 40 MG tablet   Other Relevant Orders   CMP14+EGFR   Lipid panel   Down's syndrome   Other Visit Diagnoses     Physical exam    -  Primary   Relevant Orders   CBC with Differential/Platelet    CMP14+EGFR   Lipid panel   Need for immunization against influenza       Relevant Orders   Flu Vaccine QUAD 68mo+IM (Fluarix, Fluzone & Alfiuria Quad PF) (Completed)     We will check blood work, seems to be doing well.  No change in medication.  Has no caregiver  which is his second cousin because his previous caregiver passed away.  Follow up plan: Return in about 6 months (around 05/25/2022), or if symptoms worsen or fail to improve, for Cholesterol recheck.  Counseling provided for all of the vaccine components Orders Placed This Encounter  Procedures   Flu Vaccine QUAD 46mo+IM (Fluarix, Fluzone & Alfiuria Quad PF)   CBC with Differential/Platelet   CMP14+EGFR   Lipid panel    Caryl Pina, MD [facility] 11/24/2021, 10:23 AM

## 2021-11-25 LAB — CMP14+EGFR
ALT: 21 IU/L (ref 0–44)
AST: 20 IU/L (ref 0–40)
Albumin/Globulin Ratio: 1.3 (ref 1.2–2.2)
Albumin: 3.8 g/dL — ABNORMAL LOW (ref 3.9–4.9)
Alkaline Phosphatase: 84 IU/L (ref 44–121)
BUN/Creatinine Ratio: 15 (ref 10–24)
BUN: 25 mg/dL (ref 8–27)
Bilirubin Total: 0.6 mg/dL (ref 0.0–1.2)
CO2: 24 mmol/L (ref 20–29)
Calcium: 8.8 mg/dL (ref 8.6–10.2)
Chloride: 104 mmol/L (ref 96–106)
Creatinine, Ser: 1.62 mg/dL — ABNORMAL HIGH (ref 0.76–1.27)
Globulin, Total: 3 g/dL (ref 1.5–4.5)
Glucose: 104 mg/dL — ABNORMAL HIGH (ref 70–99)
Potassium: 4.8 mmol/L (ref 3.5–5.2)
Sodium: 141 mmol/L (ref 134–144)
Total Protein: 6.8 g/dL (ref 6.0–8.5)
eGFR: 48 mL/min/{1.73_m2} — ABNORMAL LOW (ref >59)

## 2021-11-25 LAB — CBC WITH DIFFERENTIAL/PLATELET
Basophils Absolute: 0.1 10*3/uL (ref 0.0–0.2)
Basos: 1 %
EOS (ABSOLUTE): 0.2 10*3/uL (ref 0.0–0.4)
Eos: 3 %
Hematocrit: 47 % (ref 37.5–51.0)
Hemoglobin: 15.6 g/dL (ref 13.0–17.7)
Immature Grans (Abs): 0 10*3/uL (ref 0.0–0.1)
Immature Granulocytes: 0 %
Lymphocytes Absolute: 1.2 10*3/uL (ref 0.7–3.1)
Lymphs: 23 %
MCH: 32.8 pg (ref 26.6–33.0)
MCHC: 33.2 g/dL (ref 31.5–35.7)
MCV: 99 fL — ABNORMAL HIGH (ref 79–97)
Monocytes Absolute: 0.4 10*3/uL (ref 0.1–0.9)
Monocytes: 8 %
Neutrophils Absolute: 3.4 10*3/uL (ref 1.4–7.0)
Neutrophils: 65 %
Platelets: 216 10*3/uL (ref 150–450)
RBC: 4.75 x10E6/uL (ref 4.14–5.80)
RDW: 12.6 % (ref 11.6–15.4)
WBC: 5.2 10*3/uL (ref 3.4–10.8)

## 2021-11-25 LAB — LIPID PANEL
Chol/HDL Ratio: 3.1 ratio (ref 0.0–5.0)
Cholesterol, Total: 145 mg/dL (ref 100–199)
HDL: 47 mg/dL (ref >39)
LDL Chol Calc (NIH): 82 mg/dL (ref 0–99)
Triglycerides: 82 mg/dL (ref 0–149)
VLDL Cholesterol Cal: 16 mg/dL (ref 5–40)

## 2021-12-01 NOTE — Progress Notes (Signed)
R/c, please call back.

## 2021-12-03 ENCOUNTER — Other Ambulatory Visit: Payer: Self-pay

## 2021-12-03 DIAGNOSIS — K921 Melena: Secondary | ICD-10-CM

## 2021-12-07 DIAGNOSIS — B351 Tinea unguium: Secondary | ICD-10-CM | POA: Diagnosis not present

## 2021-12-07 DIAGNOSIS — M79676 Pain in unspecified toe(s): Secondary | ICD-10-CM | POA: Diagnosis not present

## 2021-12-13 DIAGNOSIS — Z9181 History of falling: Secondary | ICD-10-CM | POA: Diagnosis not present

## 2021-12-13 DIAGNOSIS — Z466 Encounter for fitting and adjustment of urinary device: Secondary | ICD-10-CM | POA: Diagnosis not present

## 2021-12-13 DIAGNOSIS — N131 Hydronephrosis with ureteral stricture, not elsewhere classified: Secondary | ICD-10-CM | POA: Diagnosis not present

## 2021-12-13 DIAGNOSIS — R338 Other retention of urine: Secondary | ICD-10-CM | POA: Diagnosis not present

## 2021-12-13 DIAGNOSIS — D649 Anemia, unspecified: Secondary | ICD-10-CM | POA: Diagnosis not present

## 2021-12-13 DIAGNOSIS — Z435 Encounter for attention to cystostomy: Secondary | ICD-10-CM | POA: Diagnosis not present

## 2021-12-13 DIAGNOSIS — E785 Hyperlipidemia, unspecified: Secondary | ICD-10-CM | POA: Diagnosis not present

## 2021-12-13 DIAGNOSIS — Z7982 Long term (current) use of aspirin: Secondary | ICD-10-CM | POA: Diagnosis not present

## 2021-12-17 ENCOUNTER — Encounter (HOSPITAL_COMMUNITY): Payer: Self-pay | Admitting: Emergency Medicine

## 2021-12-17 ENCOUNTER — Emergency Department (HOSPITAL_COMMUNITY)
Admission: EM | Admit: 2021-12-17 | Discharge: 2021-12-17 | Disposition: A | Discharge status: Home / Self Care | Payer: Medicare Other | Attending: Emergency Medicine | Admitting: Emergency Medicine

## 2021-12-17 ENCOUNTER — Other Ambulatory Visit: Payer: Self-pay

## 2021-12-17 ENCOUNTER — Emergency Department (HOSPITAL_COMMUNITY): Payer: Medicare Other

## 2021-12-17 DIAGNOSIS — Z743 Need for continuous supervision: Secondary | ICD-10-CM | POA: Diagnosis not present

## 2021-12-17 DIAGNOSIS — Z7982 Long term (current) use of aspirin: Secondary | ICD-10-CM | POA: Insufficient documentation

## 2021-12-17 DIAGNOSIS — N390 Urinary tract infection, site not specified: Secondary | ICD-10-CM

## 2021-12-17 DIAGNOSIS — R55 Syncope and collapse: Secondary | ICD-10-CM | POA: Diagnosis not present

## 2021-12-17 DIAGNOSIS — N189 Chronic kidney disease, unspecified: Secondary | ICD-10-CM | POA: Diagnosis not present

## 2021-12-17 DIAGNOSIS — R52 Pain, unspecified: Secondary | ICD-10-CM | POA: Diagnosis not present

## 2021-12-17 DIAGNOSIS — R404 Transient alteration of awareness: Secondary | ICD-10-CM | POA: Diagnosis not present

## 2021-12-17 LAB — URINALYSIS, ROUTINE W REFLEX MICROSCOPIC
Bilirubin Urine: NEGATIVE
Glucose, UA: NEGATIVE mg/dL
Ketones, ur: NEGATIVE mg/dL
Nitrite: POSITIVE — AB
Protein, ur: 30 mg/dL — AB
Specific Gravity, Urine: 1.006 (ref 1.005–1.030)
WBC, UA: >50 WBC/hpf — ABNORMAL HIGH (ref 0–5)
pH: 6 (ref 5.0–8.0)

## 2021-12-17 LAB — COMPREHENSIVE METABOLIC PANEL
ALT: 21 U/L (ref 0–44)
AST: 21 U/L (ref 15–41)
Albumin: 3.3 g/dL — ABNORMAL LOW (ref 3.5–5.0)
Alkaline Phosphatase: 78 U/L (ref 38–126)
Anion gap: 7 (ref 5–15)
BUN: 29 mg/dL — ABNORMAL HIGH (ref 8–23)
CO2: 25 mmol/L (ref 22–32)
Calcium: 8.6 mg/dL — ABNORMAL LOW (ref 8.9–10.3)
Chloride: 107 mmol/L (ref 98–111)
Creatinine, Ser: 1.77 mg/dL — ABNORMAL HIGH (ref 0.61–1.24)
GFR, Estimated: 43 mL/min — ABNORMAL LOW (ref >60)
Glucose, Bld: 109 mg/dL — ABNORMAL HIGH (ref 70–99)
Potassium: 4.3 mmol/L (ref 3.5–5.1)
Sodium: 139 mmol/L (ref 135–145)
Total Bilirubin: 0.8 mg/dL (ref 0.3–1.2)
Total Protein: 6.7 g/dL (ref 6.5–8.1)

## 2021-12-17 LAB — CBC WITH DIFFERENTIAL/PLATELET
Abs Immature Granulocytes: 0.01 10*3/uL (ref 0.00–0.07)
Basophils Absolute: 0.1 10*3/uL (ref 0.0–0.1)
Basophils Relative: 1 %
Eosinophils Absolute: 0.1 10*3/uL (ref 0.0–0.5)
Eosinophils Relative: 2 %
HCT: 42.5 % (ref 39.0–52.0)
Hemoglobin: 14.8 g/dL (ref 13.0–17.0)
Immature Granulocytes: 0 %
Lymphocytes Relative: 18 %
Lymphs Abs: 1 10*3/uL (ref 0.7–4.0)
MCH: 34.7 pg — ABNORMAL HIGH (ref 26.0–34.0)
MCHC: 34.8 g/dL (ref 30.0–36.0)
MCV: 99.8 fL (ref 80.0–100.0)
Monocytes Absolute: 0.5 10*3/uL (ref 0.1–1.0)
Monocytes Relative: 10 %
Neutro Abs: 3.8 10*3/uL (ref 1.7–7.7)
Neutrophils Relative %: 69 %
Platelets: 196 10*3/uL (ref 150–400)
RBC: 4.26 MIL/uL (ref 4.22–5.81)
RDW: 13.2 % (ref 11.5–15.5)
WBC: 5.4 10*3/uL (ref 4.0–10.5)
nRBC: 0 % (ref 0.0–0.2)

## 2021-12-17 LAB — CBG MONITORING, ED: Glucose-Capillary: 91 mg/dL (ref 70–99)

## 2021-12-17 MED ORDER — SODIUM CHLORIDE 0.9 % IV SOLN: INTRAVENOUS | Status: DC

## 2021-12-17 MED ORDER — CEPHALEXIN 500 MG PO CAPS: 500.0000 mg | ORAL_CAPSULE | Freq: Two times a day (BID) | ORAL | 0 refills | Status: AC

## 2021-12-17 MED ORDER — SODIUM CHLORIDE 0.9 % IV SOLN
1.0000 g | Freq: Once | INTRAVENOUS | Status: AC
  Administered 2021-12-17: 1 g via INTRAVENOUS
  Filled 2021-12-17: qty 10

## 2021-12-17 NOTE — ED Triage Notes (Signed)
Pt to ER via EMS from the [facility] where it was reported that pt had a syncopal episode.  Pt has hx of down's syndrome and is poor historian.  Caregiver is en route.  PT has not current c/o.

## 2021-12-17 NOTE — ED Provider Notes (Signed)
[facility] EMERGENCY DEPARTMENT Provider Note   CSN: [id number] Arrival date & time: 12/17/21  1121     History  Chief Complaint  Patient presents with   Loss of Consciousness    Frank Levine is a 61 y.o. male.  HPI Patient presents after episode of near syncope.  Patient has Down syndrome arrives via EMS, those individuals provide history.  Reportedly patient had a bowel movement, and soon after returning to his adult daycare center facility sat down, felt lightheaded, did not fall, did not lose consciousness and currently has no complaints.    Home Medications Prior to Admission medications   Medication Sig Start Date End Date Taking? Authorizing Provider  cephALEXin (KEFLEX) 500 MG capsule Take 1 capsule (500 mg total) by mouth 2 (two) times daily for 5 days. 12/17/21 12/22/21 Yes Carmin Muskrat, MD  acetaminophen (TYLENOL) 325 MG tablet Take 650 mg by mouth every 6 (six) hours as needed.    [provider]  aspirin EC 81 MG tablet Take 81 mg by mouth daily.     [provider]  cetirizine (ZYRTEC) 5 MG tablet Take 5 mg by mouth daily.    [provider]  cholecalciferol (VITAMIN D3) 25 MCG (1000 UNIT) tablet Take 1,000 Units by mouth daily.    [provider]  clotrimazole (LOTRIMIN) 1 % cream Apply 1 Application topically daily as needed (itching/inflamation).    [provider]  diclofenac Sodium (VOLTAREN) 1 % GEL Apply 2 g topically daily as needed (pain).    [provider]  docusate sodium (COLACE) 100 MG capsule Take 100 mg by mouth daily as needed for mild constipation.    [provider]  iron polysaccharides (NIFEREX) 150 MG capsule Take 150 mg by mouth daily.    [provider]  nystatin (MYCOSTATIN/NYSTOP) powder Apply 1 Application topically daily as needed (rash).    [provider]  omeprazole (PRILOSEC) 40 MG capsule Take 1 capsule (40 mg total) by mouth daily. 05/24/21    Dettinger, Fransisca Kaufmann, MD  polyethylene glycol (MIRALAX / GLYCOLAX) 17 g packet Take 17 g by mouth daily.    [provider]  simvastatin (ZOCOR) 40 MG tablet Take 1 tablet (40 mg total) by mouth daily at 6 PM. 11/24/21   Dettinger, Fransisca Kaufmann, MD      Allergies    Patient has no known allergies.    Review of Systems   Review of Systems  Unable to perform ROS: Other    Physical Exam Updated Vital Signs BP 95/78   Pulse 96   Temp 98.3 F (36.8 C) (Oral)   Resp 20   Ht 5' (1.524 m)   Wt 86 kg   SpO2 100%   BMI 37.03 kg/m  Physical Exam Vitals and nursing note reviewed.  Constitutional:      General: He is not in acute distress.    Appearance: He is well-developed.  HENT:     Head: Normocephalic and atraumatic.  Eyes:     Conjunctiva/sclera: Conjunctivae normal.  Cardiovascular:     Rate and Rhythm: Normal rate and regular rhythm.  Pulmonary:     Effort: Pulmonary effort is normal. No respiratory distress.     Breath sounds: No stridor.  Abdominal:     General: There is no distension.  Skin:    General: Skin is warm and dry.  Neurological:     Mental Status: He is alert.     Comments: Stigmata of Down  syndrome otherwise unremarkable  Psychiatric:     Comments: See above, patient is calm, cooperative, little insight into his presentation.     ED Results / Procedures / Treatments   Labs (all labs ordered are listed, but only abnormal results are displayed) Labs Reviewed  COMPREHENSIVE METABOLIC PANEL - Abnormal; Notable for the following components:      Result Value   Glucose, Bld 109 (*)    BUN 29 (*)    Creatinine, Ser 1.77 (*)    Calcium 8.6 (*)    Albumin 3.3 (*)    GFR, Estimated 43 (*)    All other components within normal limits  CBC WITH DIFFERENTIAL/PLATELET - Abnormal; Notable for the following components:   MCH 34.7 (*)    All other components within normal limits  URINALYSIS, ROUTINE W REFLEX MICROSCOPIC - Abnormal; Notable for the  following components:   APPearance HAZY (*)    Hgb urine dipstick MODERATE (*)    Protein, ur 30 (*)    Nitrite POSITIVE (*)    Leukocytes,Ua LARGE (*)    WBC, UA >50 (*)    Bacteria, UA RARE (*)    All other components within normal limits  CBG MONITORING, ED    EKG EKG Interpretation  Date/Time:  Friday December 17 2021 11:26:15 EDT Ventricular Rate:  97 PR Interval:  191 QRS Duration: 156 QT Interval:  403 QTC Calculation: 512 R Axis:   73 Text Interpretation: Sinus rhythm Right bundle branch block Baseline wander in lead(s) V2 Abnormal ECG Confirmed by Carmin Muskrat [phone]) on 12/17/2021 11:40:49 AM  Radiology DG Chest Port 1 View  Result Date: 12/17/2021 CLINICAL DATA:  Down syndrome.Syncopal episode. EXAM: PORTABLE CHEST 1 VIEW COMPARISON:  CXR 11/19/21 FINDINGS: No pleural effusion. No pneumothorax. Unchanged cardiac and mediastinal contours. No focal airspace opacity. No displaced rib fracture. Degenerative changes of the bilateral glenohumeral and AC joints. IMPRESSION: No focal airspace opacity. Electronically Signed   By: Marin Roberts M.D.   On: 12/17/2021 12:10    Procedures Procedures    Medications Ordered in ED Medications  0.9 %  sodium chloride infusion ( Intravenous New Bag/Given 12/17/21 1302)  cefTRIAXone (ROCEPHIN) 1 g in sodium chloride 0.9 % 100 mL IVPB (has no administration in time range)    ED Course/ Medical Decision Making/ A&P This patient with a Hx of Down syndrome, CKD presents to the ED for concern of syncope, this involves an extensive number of treatment options, and is a complaint that carries with it a high risk of complications and morbidity.    The differential diagnosis includes vagal episode, dehydration, infection, arrhythmia   Social Determinants of Health:  Down syndrome  Additional history obtained:  Additional history and/or information obtained from EMS, notable for details included above   After the initial  evaluation, orders, including: Labs monitoring fluids were initiated.   Patient placed on Cardiac and Pulse-Oximetry Monitors. The patient was maintained on a cardiac monitor.  The cardiac monitored showed an rhythm of 95 sinus normal The patient was also maintained on pulse oximetry. The readings were typically 100% room air normal   On repeat evaluation of the patient improved 3:08 PM Patient had previously been joined by his guardian, we discussed findings thus far, and the patient has remained unremarkable for hours of monitoring at this point. Lab Tests:  I personally interpreted labs.  The pertinent results include: Persistent demonstration of chronic kidney disease, as well as evidence for urinary tract infection  Imaging  Studies ordered:  I independently visualized and interpreted imaging which showed no pneumonia I agree with the radiologist interpretation Dispostion / Final MDM:  After consideration of the diagnostic results and the patient's response to treatment, this adult male with Down syndrome, cognitive delay presents after an episode of near syncope in the context of recent bowel movement.  Here patient is awake, alert, with no hemodynamic instability, no arrhythmia, low suspicion for cardiogenic etiology.  Some suspicion for vasovagal vagal etiology.  However, patient was also found to have urinary tract infection.  No evidence for bacteremia, sepsis.  Hospitalization a consideration given patient's cognitive status, but after he was monitored here with no decompensation, with successful fluid repletion and initiation of antibiotics was discharged with his caregiver.  Final Clinical Impression(s) / ED Diagnoses Final diagnoses:  Near syncope  Lower urinary tract infectious disease    Rx / DC Orders ED Discharge Orders          Ordered    cephALEXin (KEFLEX) 500 MG capsule  2 times daily        12/17/21 1505              Carmin Muskrat, MD 12/17/21  [phone]

## 2021-12-17 NOTE — Discharge Instructions (Signed)
As discussed, you have been diagnosed with a urinary tract infection and near syncope, or almost passing out.  It is important to complete your medication as prescribed and follow-up with your physician.  Return here for concerning changes.

## 2021-12-22 ENCOUNTER — Telehealth: Payer: Self-pay

## 2021-12-22 NOTE — Telephone Encounter (Signed)
     Patient  visit on 10/13  at [facility]   Have you been able to follow up with your primary care physician? yes  The patient was or was not able to obtain any needed medicine or equipment. yes  Are there diet recommendations that you are having difficulty following? na  Patient expresses understanding of discharge instructions and education provided has no other needs at this time.  yes    Zakari Couchman [facility] Care Guide, [place], Care Management  [phone] [address], [place], [place] [address] Phone: [phone] Email: [email or web address]    

## 2021-12-27 DIAGNOSIS — R339 Retention of urine, unspecified: Secondary | ICD-10-CM | POA: Diagnosis not present

## 2021-12-27 DIAGNOSIS — N130 Hydronephrosis with ureteropelvic junction obstruction: Secondary | ICD-10-CM | POA: Diagnosis not present

## 2022-01-03 ENCOUNTER — Other Ambulatory Visit: Payer: Medicare Other

## 2022-01-03 DIAGNOSIS — K921 Melena: Secondary | ICD-10-CM

## 2022-01-03 DIAGNOSIS — I129 Hypertensive chronic kidney disease with stage 1 through stage 4 chronic kidney disease, or unspecified chronic kidney disease: Secondary | ICD-10-CM | POA: Diagnosis not present

## 2022-01-03 DIAGNOSIS — N1832 Chronic kidney disease, stage 3b: Secondary | ICD-10-CM | POA: Diagnosis not present

## 2022-01-03 DIAGNOSIS — R809 Proteinuria, unspecified: Secondary | ICD-10-CM | POA: Diagnosis not present

## 2022-01-03 DIAGNOSIS — E870 Hyperosmolality and hypernatremia: Secondary | ICD-10-CM | POA: Diagnosis not present

## 2022-01-03 DIAGNOSIS — D7589 Other specified diseases of blood and blood-forming organs: Secondary | ICD-10-CM | POA: Diagnosis not present

## 2022-01-05 DIAGNOSIS — D7589 Other specified diseases of blood and blood-forming organs: Secondary | ICD-10-CM | POA: Diagnosis not present

## 2022-01-05 DIAGNOSIS — R809 Proteinuria, unspecified: Secondary | ICD-10-CM | POA: Diagnosis not present

## 2022-01-05 DIAGNOSIS — N1832 Chronic kidney disease, stage 3b: Secondary | ICD-10-CM | POA: Diagnosis not present

## 2022-01-05 DIAGNOSIS — I129 Hypertensive chronic kidney disease with stage 1 through stage 4 chronic kidney disease, or unspecified chronic kidney disease: Secondary | ICD-10-CM | POA: Diagnosis not present

## 2022-01-06 ENCOUNTER — Telehealth: Payer: Self-pay | Admitting: Family Medicine

## 2022-01-06 DIAGNOSIS — Q909 Down syndrome, unspecified: Secondary | ICD-10-CM

## 2022-01-06 DIAGNOSIS — F0280 Dementia in other diseases classified elsewhere without behavioral disturbance: Secondary | ICD-10-CM

## 2022-01-06 NOTE — Telephone Encounter (Signed)
Patient's daycare said that they would like for him to see a neurologist, needs a referral placed. Said they have spoke to PCP about this in the past and were told that he could put it in at anytime. Please call back if there are any issues.

## 2022-01-06 NOTE — Telephone Encounter (Signed)
Referral placed to Neurology.  Will forward to Dettinger to make aware.

## 2022-01-10 DIAGNOSIS — Z9181 History of falling: Secondary | ICD-10-CM | POA: Diagnosis not present

## 2022-01-10 DIAGNOSIS — Z435 Encounter for attention to cystostomy: Secondary | ICD-10-CM | POA: Diagnosis not present

## 2022-01-10 DIAGNOSIS — R338 Other retention of urine: Secondary | ICD-10-CM | POA: Diagnosis not present

## 2022-01-10 DIAGNOSIS — E785 Hyperlipidemia, unspecified: Secondary | ICD-10-CM | POA: Diagnosis not present

## 2022-01-10 DIAGNOSIS — Z466 Encounter for fitting and adjustment of urinary device: Secondary | ICD-10-CM | POA: Diagnosis not present

## 2022-01-10 DIAGNOSIS — Z7982 Long term (current) use of aspirin: Secondary | ICD-10-CM | POA: Diagnosis not present

## 2022-01-10 DIAGNOSIS — D649 Anemia, unspecified: Secondary | ICD-10-CM | POA: Diagnosis not present

## 2022-01-10 DIAGNOSIS — N131 Hydronephrosis with ureteral stricture, not elsewhere classified: Secondary | ICD-10-CM | POA: Diagnosis not present

## 2022-01-10 NOTE — Telephone Encounter (Signed)
Sounds good, thank you for placed referral

## 2022-01-14 ENCOUNTER — Encounter: Payer: Self-pay | Admitting: Physician Assistant

## 2022-01-14 ENCOUNTER — Ambulatory Visit (INDEPENDENT_AMBULATORY_CARE_PROVIDER_SITE_OTHER): Payer: Medicare Other | Admitting: Physician Assistant

## 2022-01-14 VITALS — BP 113/79 | HR 58 | Ht 60.0 in | Wt 170.0 lb

## 2022-01-14 DIAGNOSIS — G301 Alzheimer's disease with late onset: Secondary | ICD-10-CM | POA: Diagnosis not present

## 2022-01-14 DIAGNOSIS — G309 Alzheimer's disease, unspecified: Secondary | ICD-10-CM | POA: Diagnosis not present

## 2022-01-14 DIAGNOSIS — Q909 Down syndrome, unspecified: Secondary | ICD-10-CM | POA: Diagnosis not present

## 2022-01-14 DIAGNOSIS — F02818 Dementia in other diseases classified elsewhere, unspecified severity, with other behavioral disturbance: Secondary | ICD-10-CM | POA: Diagnosis not present

## 2022-01-14 DIAGNOSIS — F0280 Dementia in other diseases classified elsewhere without behavioral disturbance: Secondary | ICD-10-CM

## 2022-01-14 MED ORDER — DIVALPROEX SODIUM 125 MG PO DR TAB: 125.0000 mg | DELAYED_RELEASE_TABLET | Freq: Every evening | ORAL | 11 refills | Status: DC

## 2022-01-14 NOTE — Patient Instructions (Signed)
It was a pleasure to see you today at our office.   Recommendations:  Follow up in  6  months  Whom to call:  Memory  decline, memory medications: Call our office [phone]    For assessment of decision of mental capacity and competency:  Call Dr. Anthoney Harada, geriatric psychiatrist at [phone]  For guidance in geriatric dementia issues please call [facility] [phone]  If you have any severe symptoms of a stroke, or other severe issues such as confusion,severe chills or fever, etc call 911 or go to the ER as you may need to be evaluated further   Feel free to visit Facebook page " [email or web address]" for tips of how to care for people with memory problems.      RECOMMENDATIONS FOR ALL PATIENTS WITH MEMORY PROBLEMS: 1. Continue to exercise (Recommend 30 minutes of walking everyday, or 3 hours every week) 2. Increase social interactions - continue going to [place] and enjoy social gatherings with friends and family 3. Eat healthy, avoid fried foods and eat more fruits and vegetables 4. Maintain adequate blood pressure, blood sugar, and blood cholesterol level. Reducing the risk of stroke and cardiovascular disease also helps promoting better memory. 5. Avoid stressful situations. Live a simple life and avoid aggravations. Organize your time and prepare for the next day in anticipation. 6. Sleep well, avoid any interruptions of sleep and avoid any distractions in the bedroom that may interfere with adequate sleep quality 7. Avoid sugar, avoid sweets as there is a strong link between excessive sugar intake, diabetes, and cognitive impairment We discussed the Mediterranean diet, which has been shown to help patients reduce the risk of progressive memory disorders and reduces cardiovascular risk. This includes eating fish, eat fruits and green leafy vegetables, nuts like almonds and hazelnuts, walnuts, and also use olive oil. Avoid fast foods and fried foods as much as possible.  Avoid sweets and sugar as sugar use has been linked to worsening of memory function.  There is always a concern of gradual progression of memory problems. If this is the case, then we may need to adjust level of care according to patient needs. Support, both to the patient and caregiver, should then be put into place.    FALL PRECAUTIONS: Be cautious when walking. Scan the area for obstacles that may increase the risk of trips and falls. When getting up in the mornings, sit up at the edge of the bed for a few minutes before getting out of bed. Consider elevating the bed at the head end to avoid drop of blood pressure when getting up. Walk always in a well-lit room (use night lights in the walls). Avoid area rugs or power cords from appliances in the middle of the walkways. Use a walker or a cane if necessary and consider physical therapy for balance exercise. Get your eyesight checked regularly.  FINANCIAL OVERSIGHT: Supervision, especially oversight when making financial decisions or transactions is also recommended.  HOME SAFETY: Consider the safety of the kitchen when operating appliances like stoves, microwave oven, and blender. Consider having supervision and share cooking responsibilities until no longer able to participate in those. Accidents with firearms and other hazards in the house should be identified and addressed as well.   ABILITY TO BE LEFT ALONE: If patient is unable to contact 911 operator, consider using LifeLine, or when the need is there, arrange for someone to stay with patients. Smoking is a fire hazard, consider supervision or cessation. Risk of wandering  should be assessed by caregiver and if detected at any point, supervision and safe proof recommendations should be instituted.  MEDICATION SUPERVISION: Inability to self-administer medication needs to be constantly addressed. Implement a mechanism to ensure safe administration of the medications.
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
## 2022-02-08 ENCOUNTER — Other Ambulatory Visit: Payer: Medicare Other

## 2022-02-08 DIAGNOSIS — Z9181 History of falling: Secondary | ICD-10-CM | POA: Diagnosis not present

## 2022-02-08 DIAGNOSIS — E785 Hyperlipidemia, unspecified: Secondary | ICD-10-CM | POA: Diagnosis not present

## 2022-02-08 DIAGNOSIS — Z7982 Long term (current) use of aspirin: Secondary | ICD-10-CM | POA: Diagnosis not present

## 2022-02-08 DIAGNOSIS — N131 Hydronephrosis with ureteral stricture, not elsewhere classified: Secondary | ICD-10-CM | POA: Diagnosis not present

## 2022-02-08 DIAGNOSIS — D649 Anemia, unspecified: Secondary | ICD-10-CM | POA: Diagnosis not present

## 2022-02-08 DIAGNOSIS — Z435 Encounter for attention to cystostomy: Secondary | ICD-10-CM | POA: Diagnosis not present

## 2022-02-08 DIAGNOSIS — N1832 Chronic kidney disease, stage 3b: Secondary | ICD-10-CM | POA: Diagnosis not present

## 2022-02-08 DIAGNOSIS — Z466 Encounter for fitting and adjustment of urinary device: Secondary | ICD-10-CM | POA: Diagnosis not present

## 2022-02-08 DIAGNOSIS — I129 Hypertensive chronic kidney disease with stage 1 through stage 4 chronic kidney disease, or unspecified chronic kidney disease: Secondary | ICD-10-CM | POA: Diagnosis not present

## 2022-02-08 DIAGNOSIS — R338 Other retention of urine: Secondary | ICD-10-CM | POA: Diagnosis not present

## 2022-02-08 DIAGNOSIS — D7589 Other specified diseases of blood and blood-forming organs: Secondary | ICD-10-CM | POA: Diagnosis not present

## 2022-02-08 DIAGNOSIS — R809 Proteinuria, unspecified: Secondary | ICD-10-CM | POA: Diagnosis not present

## 2022-02-10 DIAGNOSIS — R8279 Other abnormal findings on microbiological examination of urine: Secondary | ICD-10-CM | POA: Diagnosis not present

## 2022-02-10 DIAGNOSIS — N3020 Other chronic cystitis without hematuria: Secondary | ICD-10-CM | POA: Diagnosis not present

## 2022-02-10 DIAGNOSIS — N130 Hydronephrosis with ureteropelvic junction obstruction: Secondary | ICD-10-CM | POA: Diagnosis not present

## 2022-02-14 DIAGNOSIS — R338 Other retention of urine: Secondary | ICD-10-CM | POA: Diagnosis not present

## 2022-02-15 DIAGNOSIS — M79676 Pain in unspecified toe(s): Secondary | ICD-10-CM | POA: Diagnosis not present

## 2022-02-15 DIAGNOSIS — B351 Tinea unguium: Secondary | ICD-10-CM | POA: Diagnosis not present

## 2022-02-22 ENCOUNTER — Telehealth: Payer: Self-pay | Admitting: Family Medicine

## 2022-02-22 NOTE — Telephone Encounter (Signed)
Sister said that Dr. Warrick Parisian told her and her mom that when [facility] alzheimer got worse there were options.  States he is moving in that direction and would like to know what steps to make?  Aware you are not in office today.

## 2022-02-23 NOTE — Telephone Encounter (Signed)
So there are different options that they could try for, some of them are including palliative and hospice care which will get assistance.  Other options are for placement in a facility such as a nursing home or rehab facility.  There are also Programs That They Can Try and Get Some Help through.  Let us Know What They Would like to Try or If They Would like to Discuss the Options and Have Them Make a Visit

## 2022-03-11 DIAGNOSIS — E785 Hyperlipidemia, unspecified: Secondary | ICD-10-CM | POA: Diagnosis not present

## 2022-03-11 DIAGNOSIS — Z466 Encounter for fitting and adjustment of urinary device: Secondary | ICD-10-CM | POA: Diagnosis not present

## 2022-03-11 DIAGNOSIS — N131 Hydronephrosis with ureteral stricture, not elsewhere classified: Secondary | ICD-10-CM | POA: Diagnosis not present

## 2022-03-11 DIAGNOSIS — Z7982 Long term (current) use of aspirin: Secondary | ICD-10-CM | POA: Diagnosis not present

## 2022-03-11 DIAGNOSIS — Z9181 History of falling: Secondary | ICD-10-CM | POA: Diagnosis not present

## 2022-03-11 DIAGNOSIS — Z435 Encounter for attention to cystostomy: Secondary | ICD-10-CM | POA: Diagnosis not present

## 2022-03-11 DIAGNOSIS — D649 Anemia, unspecified: Secondary | ICD-10-CM | POA: Diagnosis not present

## 2022-03-11 DIAGNOSIS — R338 Other retention of urine: Secondary | ICD-10-CM | POA: Diagnosis not present

## 2022-03-15 DIAGNOSIS — N3020 Other chronic cystitis without hematuria: Secondary | ICD-10-CM | POA: Diagnosis not present

## 2022-03-15 DIAGNOSIS — R339 Retention of urine, unspecified: Secondary | ICD-10-CM | POA: Diagnosis not present

## 2022-03-23 NOTE — Telephone Encounter (Signed)
Spoke with Frank Levine. He would like for pt to stay at home as much as possible but also leaning towards a nursing home or memory care facility.

## 2022-03-24 NOTE — Telephone Encounter (Signed)
Palliative care and hospice care are both great options if they want him to stay at home with assistance.  At the next visit we need to have this discussion or they can make a visit sooner that we can have this discussion and place referrals.  If they would like to go to a facility that we can work on that paperwork at that appointment as well.

## 2022-03-24 NOTE — Telephone Encounter (Signed)
Left message to return call. Advised to schedule an appt to come back in and see Dettinger to place referrals.

## 2022-04-06 DIAGNOSIS — D649 Anemia, unspecified: Secondary | ICD-10-CM | POA: Diagnosis not present

## 2022-04-06 DIAGNOSIS — Z9181 History of falling: Secondary | ICD-10-CM | POA: Diagnosis not present

## 2022-04-06 DIAGNOSIS — R338 Other retention of urine: Secondary | ICD-10-CM | POA: Diagnosis not present

## 2022-04-06 DIAGNOSIS — E785 Hyperlipidemia, unspecified: Secondary | ICD-10-CM | POA: Diagnosis not present

## 2022-04-06 DIAGNOSIS — N131 Hydronephrosis with ureteral stricture, not elsewhere classified: Secondary | ICD-10-CM | POA: Diagnosis not present

## 2022-04-06 DIAGNOSIS — Z435 Encounter for attention to cystostomy: Secondary | ICD-10-CM | POA: Diagnosis not present

## 2022-04-06 DIAGNOSIS — Z7982 Long term (current) use of aspirin: Secondary | ICD-10-CM | POA: Diagnosis not present

## 2022-04-06 DIAGNOSIS — Z466 Encounter for fitting and adjustment of urinary device: Secondary | ICD-10-CM | POA: Diagnosis not present

## 2022-04-07 ENCOUNTER — Telehealth: Payer: Self-pay | Admitting: Family Medicine

## 2022-04-07 NOTE — Telephone Encounter (Signed)
Per [place] with Latricia Heft this is just an FYI. Pt has stumbled or tripped a couple of times and protocol is to make PCP aware. She states that no appt needs to be scheduled.

## 2022-04-07 NOTE — Telephone Encounter (Signed)
Frank Levine [phone] Nurse) called to report that patients caregiver has reported that patient has fallen twice in the month of January but with no injuries.

## 2022-04-13 ENCOUNTER — Other Ambulatory Visit: Payer: 59

## 2022-04-21 ENCOUNTER — Other Ambulatory Visit: Payer: 59

## 2022-04-21 DIAGNOSIS — D7589 Other specified diseases of blood and blood-forming organs: Secondary | ICD-10-CM | POA: Diagnosis not present

## 2022-04-21 DIAGNOSIS — N1832 Chronic kidney disease, stage 3b: Secondary | ICD-10-CM | POA: Diagnosis not present

## 2022-04-21 DIAGNOSIS — I129 Hypertensive chronic kidney disease with stage 1 through stage 4 chronic kidney disease, or unspecified chronic kidney disease: Secondary | ICD-10-CM | POA: Diagnosis not present

## 2022-04-21 DIAGNOSIS — E870 Hyperosmolality and hypernatremia: Secondary | ICD-10-CM | POA: Diagnosis not present

## 2022-04-21 DIAGNOSIS — R809 Proteinuria, unspecified: Secondary | ICD-10-CM | POA: Diagnosis not present

## 2022-04-27 DIAGNOSIS — I129 Hypertensive chronic kidney disease with stage 1 through stage 4 chronic kidney disease, or unspecified chronic kidney disease: Secondary | ICD-10-CM | POA: Diagnosis not present

## 2022-04-27 DIAGNOSIS — R809 Proteinuria, unspecified: Secondary | ICD-10-CM | POA: Diagnosis not present

## 2022-04-27 DIAGNOSIS — N1831 Chronic kidney disease, stage 3a: Secondary | ICD-10-CM | POA: Diagnosis not present

## 2022-04-27 DIAGNOSIS — D7589 Other specified diseases of blood and blood-forming organs: Secondary | ICD-10-CM | POA: Diagnosis not present

## 2022-05-02 DIAGNOSIS — Z466 Encounter for fitting and adjustment of urinary device: Secondary | ICD-10-CM | POA: Diagnosis not present

## 2022-05-02 DIAGNOSIS — N131 Hydronephrosis with ureteral stricture, not elsewhere classified: Secondary | ICD-10-CM | POA: Diagnosis not present

## 2022-05-02 DIAGNOSIS — Z7982 Long term (current) use of aspirin: Secondary | ICD-10-CM | POA: Diagnosis not present

## 2022-05-02 DIAGNOSIS — R338 Other retention of urine: Secondary | ICD-10-CM | POA: Diagnosis not present

## 2022-05-02 DIAGNOSIS — Z435 Encounter for attention to cystostomy: Secondary | ICD-10-CM | POA: Diagnosis not present

## 2022-05-02 DIAGNOSIS — E785 Hyperlipidemia, unspecified: Secondary | ICD-10-CM | POA: Diagnosis not present

## 2022-05-02 DIAGNOSIS — D649 Anemia, unspecified: Secondary | ICD-10-CM | POA: Diagnosis not present

## 2022-05-02 DIAGNOSIS — Z9181 History of falling: Secondary | ICD-10-CM | POA: Diagnosis not present

## 2022-05-03 DIAGNOSIS — B351 Tinea unguium: Secondary | ICD-10-CM | POA: Diagnosis not present

## 2022-05-03 DIAGNOSIS — M79676 Pain in unspecified toe(s): Secondary | ICD-10-CM | POA: Diagnosis not present

## 2022-05-05 ENCOUNTER — Encounter: Payer: Self-pay | Admitting: Radiology

## 2022-05-11 ENCOUNTER — Emergency Department (HOSPITAL_COMMUNITY)
Admission: EM | Admit: 2022-05-11 | Discharge: 2022-05-11 | Disposition: A | Discharge status: Home / Self Care | Payer: 59 | Attending: Emergency Medicine | Admitting: Emergency Medicine

## 2022-05-11 ENCOUNTER — Encounter (HOSPITAL_COMMUNITY): Payer: Self-pay | Admitting: *Deleted

## 2022-05-11 ENCOUNTER — Other Ambulatory Visit: Payer: Self-pay

## 2022-05-11 DIAGNOSIS — T83098A Other mechanical complication of other indwelling urethral catheter, initial encounter: Secondary | ICD-10-CM | POA: Diagnosis not present

## 2022-05-11 DIAGNOSIS — Z7982 Long term (current) use of aspirin: Secondary | ICD-10-CM | POA: Insufficient documentation

## 2022-05-11 DIAGNOSIS — T83091A Other mechanical complication of indwelling urethral catheter, initial encounter: Secondary | ICD-10-CM | POA: Diagnosis not present

## 2022-05-11 DIAGNOSIS — B379 Candidiasis, unspecified: Secondary | ICD-10-CM | POA: Insufficient documentation

## 2022-05-11 DIAGNOSIS — T839XXA Unspecified complication of genitourinary prosthetic device, implant and graft, initial encounter: Secondary | ICD-10-CM

## 2022-05-11 HISTORY — DX: Unspecified dementia, unspecified severity, without behavioral disturbance, psychotic disturbance, mood disturbance, and anxiety: F03.90

## 2022-05-11 MED ORDER — NYSTATIN 100000 UNIT/GM EX CREA: 1.0000 | TOPICAL_CREAM | Freq: Four times a day (QID) | CUTANEOUS | 0 refills | Status: DC

## 2022-05-11 MED ORDER — NYSTATIN 100000 UNIT/GM EX CREA
TOPICAL_CREAM | Freq: Two times a day (BID) | CUTANEOUS | Status: DC
  Administered 2022-05-11: 1 via TOPICAL
  Filled 2022-05-11: qty 15

## 2022-05-11 NOTE — ED Triage Notes (Signed)
Caregiver states pt pulled his suprapubic foley catheter out. Caregiver states pt with dementia.

## 2022-05-12 NOTE — ED Provider Notes (Signed)
[name]   CSN: [id number] Arrival date & time: 05/11/22  2233     History  Chief Complaint  Patient presents with   replace foley    Frank Levine is a 62 y.o. male.  He is here with one of his caregivers secondary to pulling out a suprapubic catheter.  The caregiver says that he just walked into the room and the patient was standing there with a catheter in his hand.  Did not seem to be in pain.  Unclear why did this.  Has never done before.  Brings the catheter in with him and the balloon is still inflated.  States the catheter has been in for at least 3 years that he has been taking care of him.        Home Medications Prior to Admission medications   Medication Sig Start Date End Date Taking? Authorizing Provider  nystatin cream (MYCOSTATIN) Apply 1 Application topically 4 (four) times daily. Apply to affected area every 4-6 hours x 10 days 05/11/22  Yes Okey Zelek, Corene Cornea, MD  acetaminophen (TYLENOL) 325 MG tablet Take 650 mg by mouth every 6 (six) hours as needed.    [provider]  aspirin EC 81 MG tablet Take 81 mg by mouth daily.     [provider]  cetirizine (ZYRTEC) 5 MG tablet Take 5 mg by mouth daily.    [provider]  cholecalciferol (VITAMIN D3) 25 MCG (1000 UNIT) tablet Take 1,000 Units by mouth daily.    [provider]  clotrimazole (LOTRIMIN) 1 % cream Apply 1 Application topically daily as needed (itching/inflamation).    [provider]  diclofenac Sodium (VOLTAREN) 1 % GEL Apply 2 g topically daily as needed (pain).    [provider]  divalproex (DEPAKOTE) 125 MG DR tablet Take 1 tablet (125 mg total) by mouth at bedtime. 01/14/22   Rondel Jumbo, PA-C  docusate sodium (COLACE) 100 MG capsule Take 100 mg by mouth daily as needed for mild constipation.    [provider]  iron polysaccharides (NIFEREX) 150 MG capsule Take 150 mg by  mouth daily.    [provider]  omeprazole (PRILOSEC) 40 MG capsule Take 1 capsule (40 mg total) by mouth daily. 05/24/21   Dettinger, Fransisca Kaufmann, MD  polyethylene glycol (MIRALAX / GLYCOLAX) 17 g packet Take 17 g by mouth daily.    [provider]  simvastatin (ZOCOR) 40 MG tablet Take 1 tablet (40 mg total) by mouth daily at 6 PM. 11/24/21   Dettinger, Fransisca Kaufmann, MD      Allergies    Patient has no known allergies.    Review of Systems   Review of Systems  Physical Exam Updated Vital Signs BP 121/83 (BP Location: Right Arm)   Pulse 87   Temp 97.9 F (36.6 C) (Temporal)   Resp 16   Ht 5' (1.524 m)   Wt 83.9 kg   SpO2 96%   BMI 36.13 kg/m  Physical Exam Vitals and nursing note reviewed.  Constitutional:      Appearance: He is well-developed.  HENT:     Head: Normocephalic and atraumatic.  Eyes:     Pupils: Pupils are equal, round, and reactive to light.  Cardiovascular:     Rate and Rhythm: Normal rate.  Pulmonary:     Effort: Pulmonary effort is normal. No respiratory distress.  Abdominal:     General: There is no  distension.  Musculoskeletal:        General: Normal range of motion.     Cervical back: Normal range of motion.  Skin:    General: Skin is warm and dry.     Coloration: Skin is not jaundiced or pale.     Findings: Rash (erythema with satellite lesions around his suprapubic site) present.     Comments: Small amount of blood on skin around catheter site  Neurological:     General: No focal deficit present.     Mental Status: He is alert.     ED Results / Procedures / Treatments   Labs (all labs ordered are listed, but only abnormal results are displayed) Labs Reviewed - No data to display  EKG None  Radiology No results found.  Procedures BLADDER CATHETERIZATION  Date/Time: 05/12/2022 1:41 AM  Performed by: Merrily Pew, MD Authorized by: Merrily Pew, MD   Consent:    Consent obtained:  Verbal   Consent given by:   Healthcare agent   Risks, benefits, and alternatives were discussed: yes     Risks discussed:  False passage, infection, urethral injury, pain and incomplete procedure   Alternatives discussed:  No treatment Universal protocol:    Procedure explained and questions answered to patient or proxy's satisfaction: yes     Patient identity confirmed:  Arm band and hospital-assigned identification number Pre-procedure details:    Procedure purpose:  Therapeutic   Preparation: Patient was prepped and draped in usual sterile fashion   Anesthesia:    Anesthesia method:  None Procedure details:    Catheter insertion:  Indwelling   Catheter type:  Foley   Catheter size:  14 Fr   Bladder irrigation: no     Number of attempts:  1   Urine characteristics:  Clear and yellow Post-procedure details:    Procedure completion:  Tolerated     Medications Ordered in ED Medications  nystatin cream (MYCOSTATIN) (1 Application Topical Given 05/11/22 2345)    ED Course/ Medical Decision Making/ A&P                             Medical Decision Making Risk Prescription drug management.   Suprapubic catheter replaced without any obvious complication.  Rash consistent with likely yeast infection.  Will give him some nystatin cream for that as well.  Asked him to follow-up with his urologist in a few days to get the catheter upsized as needed as I did put in a slightly smaller one to avoid any false passages with the obvious trauma from the removal of the previous catheter.  Nursing secured the new catheter.   Final Clinical Impression(s) / ED Diagnoses Final diagnoses:  Problem with Foley catheter, initial encounter (Frank Levine)  Yeast infection    Rx / DC Orders ED Discharge Orders          Ordered    nystatin cream (MYCOSTATIN)  4 times daily        05/11/22 2327              Kearney Evitt, Corene Cornea, MD 05/12/22 [phone]

## 2022-05-23 ENCOUNTER — Encounter: Payer: Self-pay | Admitting: Family Medicine

## 2022-05-23 ENCOUNTER — Ambulatory Visit (INDEPENDENT_AMBULATORY_CARE_PROVIDER_SITE_OTHER): Payer: 59 | Admitting: Family Medicine

## 2022-05-23 VITALS — BP 115/66 | HR 100 | Ht 60.0 in | Wt 166.0 lb

## 2022-05-23 DIAGNOSIS — N183 Chronic kidney disease, stage 3 unspecified: Secondary | ICD-10-CM

## 2022-05-23 DIAGNOSIS — Q909 Down syndrome, unspecified: Secondary | ICD-10-CM

## 2022-05-23 DIAGNOSIS — F0280 Dementia in other diseases classified elsewhere without behavioral disturbance: Secondary | ICD-10-CM

## 2022-05-23 DIAGNOSIS — B372 Candidiasis of skin and nail: Secondary | ICD-10-CM

## 2022-05-23 DIAGNOSIS — E782 Mixed hyperlipidemia: Secondary | ICD-10-CM

## 2022-05-23 DIAGNOSIS — K219 Gastro-esophageal reflux disease without esophagitis: Secondary | ICD-10-CM

## 2022-05-23 DIAGNOSIS — G300 Alzheimer's disease with early onset: Secondary | ICD-10-CM | POA: Diagnosis not present

## 2022-05-23 MED ORDER — OMEPRAZOLE 40 MG PO CPDR: 40.0000 mg | DELAYED_RELEASE_CAPSULE | Freq: Every day | ORAL | 3 refills | Status: DC

## 2022-05-23 NOTE — Progress Notes (Signed)
BP 115/66   Pulse 100   Ht 5' (1.524 m)   Wt 166 lb (75.3 kg)   SpO2 99%   BMI 32.42 kg/m    Subjective:   Patient ID: Frank Levine, male    DOB: [birth year], 62 y.o.   MRN: [id number]  HPI: Frank Levine is a 62 y.o. male presenting on 05/23/2022 for Medical Management of Chronic Issues, Hyperlipidemia, and Chronic Kidney Disease (/)   HPI Hyperlipidemia Patient is coming in for recheck of his hyperlipidemia. The patient is currently taking simvastatin. They deny any issues with myalgias or history of liver damage from it. They deny any focal numbness or weakness or chest pain.   GERD Patient is currently on omeprazole.  She denies any major symptoms or abdominal pain or belching or burping. She denies any blood in her stool or lightheadedness or dizziness.   Down syndrome and early onset Alzheimer's Patient has Down syndrome with early onset Alzheimer's likely associated with Down syndrome.  His caretaker for him a lot.  Relevant past medical, surgical, family and social history reviewed and updated as indicated. Interim medical history since our last visit reviewed. Allergies and medications reviewed and updated.  Review of Systems  Constitutional:  Negative for chills and fever.  Eyes:  Negative for visual disturbance.  Respiratory:  Negative for shortness of breath and wheezing.   Cardiovascular:  Negative for chest pain and leg swelling.  Gastrointestinal:  Negative for abdominal pain, constipation, diarrhea and vomiting.  Skin:  Negative for rash.  Neurological:  Negative for dizziness, weakness and light-headedness.  Psychiatric/Behavioral:  Positive for behavioral problems, confusion and dysphoric mood (Sometimes has mood swings but also recommend regular to want to get this point.). Negative for self-injury, sleep disturbance and suicidal ideas.   All other systems reviewed and are negative.   Per HPI unless specifically indicated above   Allergies as of  05/23/2022   No Known Allergies      Medication List        Accurate as of May 23, 2022  9:39 AM. If you have any questions, ask your nurse or doctor.          acetaminophen 325 MG tablet Commonly known as: TYLENOL Take 650 mg by mouth every 6 (six) hours as needed.   aspirin EC 81 MG tablet Take 81 mg by mouth daily.   cetirizine 5 MG tablet Commonly known as: ZYRTEC Take 5 mg by mouth daily.   cholecalciferol 25 MCG (1000 UNIT) tablet Commonly known as: VITAMIN D3 Take 1,000 Units by mouth daily.   clotrimazole 1 % cream Commonly known as: LOTRIMIN Apply 1 Application topically daily as needed (itching/inflamation).   diclofenac Sodium 1 % Gel Commonly known as: VOLTAREN Apply 2 g topically daily as needed (pain).   divalproex 125 MG DR tablet Commonly known as: Depakote Take 1 tablet (125 mg total) by mouth at bedtime.   docusate sodium 100 MG capsule Commonly known as: COLACE Take 100 mg by mouth daily as needed for mild constipation.   iron polysaccharides 150 MG capsule Commonly known as: NIFEREX Take 150 mg by mouth daily.   nitrofurantoin (macrocrystal-monohydrate) 100 MG capsule Commonly known as: MACROBID Take 100 mg by mouth daily.   nystatin cream Commonly known as: MYCOSTATIN Apply 1 Application topically 4 (four) times daily. Apply to affected area every 4-6 hours x 10 days   omeprazole 40 MG capsule Commonly known as: PRILOSEC Take 1 capsule (40 mg total)  by mouth daily.   polyethylene glycol 17 g packet Commonly known as: MIRALAX / GLYCOLAX Take 17 g by mouth daily.   simvastatin 40 MG tablet Commonly known as: ZOCOR Take 1 tablet (40 mg total) by mouth daily at 6 PM.         Objective:   BP 115/66   Pulse 100   Ht 5' (1.524 m)   Wt 166 lb (75.3 kg)   SpO2 99%   BMI 32.42 kg/m   Wt Readings from Last 3 Encounters:  05/23/22 166 lb (75.3 kg)  05/11/22 185 lb (83.9 kg)  01/14/22 170 lb (77.1 kg)    Physical  Exam Vitals and nursing note reviewed.  Constitutional:      General: He is not in acute distress.    Appearance: He is well-developed. He is not diaphoretic.  Eyes:     General: No scleral icterus.    Conjunctiva/sclera: Conjunctivae normal.  Neck:     Thyroid: No thyromegaly.  Cardiovascular:     Rate and Rhythm: Normal rate and regular rhythm.     Heart sounds: Normal heart sounds. No murmur heard. Pulmonary:     Effort: Pulmonary effort is normal. No respiratory distress.     Breath sounds: Normal breath sounds. No wheezing.  Abdominal:     General: Abdomen is flat. Bowel sounds are normal. There is no distension.     Tenderness: There is no abdominal tenderness. There is no guarding.     Hernia: No hernia is present.  Musculoskeletal:        General: Normal range of motion.     Cervical back: Neck supple.  Lymphadenopathy:     Cervical: No cervical adenopathy.  Skin:    General: Skin is warm and dry.     Findings: Rash (Pink papular lesion with satellite lesions surrounding the catheter site) present.  Neurological:     Mental Status: He is alert and oriented to person, place, and time.     Coordination: Coordination normal.  Psychiatric:        Behavior: Behavior normal.       Assessment & Plan:   Problem List Items Addressed This Visit       Digestive   GERD (gastroesophageal reflux disease)   Relevant Medications   omeprazole (PRILOSEC) 40 MG capsule     Nervous and Auditory   Early onset Alzheimer's dementia without behavioral disturbance (HCC)     Genitourinary   CKD (chronic kidney disease) stage 3, GFR 30-59 ml/min (HCC) (Chronic)   Relevant Orders   CBC with Differential/Platelet     Other   Hyperlipidemia   Relevant Orders   CMP14+EGFR   Lipid panel   Down's syndrome - Primary   Other Visit Diagnoses     Yeast dermatitis       Relevant Medications   nitrofurantoin, macrocrystal-monohydrate, (MACROBID) 100 MG capsule       Patient  takes nitrofurantoin all the time.  He just had his suprapubic catheter replaced because it was pulled and it seems to be working per caregiver.  Caregiver provided most of the history. Everything else looks good and stable, will check blood work today.  Discussed mood medication such as Zoloft or Lexapro as possibilities.  Caregiver says it is not to the point where we need that yet but will keep in mind. Follow up plan: Return in about 6 months (around 11/23/2022), or if symptoms worsen or fail to improve, for hld and gerd.  Counseling  provided for all of the vaccine components Orders Placed This Encounter  Procedures   CBC with Differential/Platelet   CMP14+EGFR   Lipid panel    Caryl Pina, MD [facility] 05/23/2022, 9:39 AM

## 2022-05-24 LAB — LIPID PANEL
Chol/HDL Ratio: 2.9 ratio (ref 0.0–5.0)
Cholesterol, Total: 165 mg/dL (ref 100–199)
HDL: 57 mg/dL (ref >39)
LDL Chol Calc (NIH): 95 mg/dL (ref 0–99)
Triglycerides: 70 mg/dL (ref 0–149)
VLDL Cholesterol Cal: 13 mg/dL (ref 5–40)

## 2022-05-24 LAB — CBC WITH DIFFERENTIAL/PLATELET
Basophils Absolute: 0.1 10*3/uL (ref 0.0–0.2)
Basos: 2 %
EOS (ABSOLUTE): 0.1 10*3/uL (ref 0.0–0.4)
Eos: 3 %
Hematocrit: 44.2 % (ref 37.5–51.0)
Hemoglobin: 15.4 g/dL (ref 13.0–17.7)
Immature Grans (Abs): 0 10*3/uL (ref 0.0–0.1)
Immature Granulocytes: 0 %
Lymphocytes Absolute: 1.1 10*3/uL (ref 0.7–3.1)
Lymphs: 25 %
MCH: 34.2 pg — ABNORMAL HIGH (ref 26.6–33.0)
MCHC: 34.8 g/dL (ref 31.5–35.7)
MCV: 98 fL — ABNORMAL HIGH (ref 79–97)
Monocytes Absolute: 0.4 10*3/uL (ref 0.1–0.9)
Monocytes: 8 %
Neutrophils Absolute: 2.8 10*3/uL (ref 1.4–7.0)
Neutrophils: 62 %
Platelets: 224 10*3/uL (ref 150–450)
RBC: 4.5 x10E6/uL (ref 4.14–5.80)
RDW: 13.9 % (ref 11.6–15.4)
WBC: 4.5 10*3/uL (ref 3.4–10.8)

## 2022-05-24 LAB — CMP14+EGFR
ALT: 26 IU/L (ref 0–44)
AST: 22 IU/L (ref 0–40)
Albumin/Globulin Ratio: 1.2 (ref 1.2–2.2)
Albumin: 3.6 g/dL — ABNORMAL LOW (ref 3.9–4.9)
Alkaline Phosphatase: 96 IU/L (ref 44–121)
BUN/Creatinine Ratio: 13 (ref 10–24)
BUN: 23 mg/dL (ref 8–27)
Bilirubin Total: 0.3 mg/dL (ref 0.0–1.2)
CO2: 22 mmol/L (ref 20–29)
Calcium: 8.9 mg/dL (ref 8.6–10.2)
Chloride: 106 mmol/L (ref 96–106)
Creatinine, Ser: 1.78 mg/dL — ABNORMAL HIGH (ref 0.76–1.27)
Globulin, Total: 2.9 g/dL (ref 1.5–4.5)
Glucose: 118 mg/dL — ABNORMAL HIGH (ref 70–99)
Potassium: 4.8 mmol/L (ref 3.5–5.2)
Sodium: 143 mmol/L (ref 134–144)
Total Protein: 6.5 g/dL (ref 6.0–8.5)
eGFR: 43 mL/min/{1.73_m2} — ABNORMAL LOW (ref >59)

## 2022-06-09 ENCOUNTER — Telehealth: Payer: Self-pay | Admitting: Family Medicine

## 2022-06-09 DIAGNOSIS — E785 Hyperlipidemia, unspecified: Secondary | ICD-10-CM | POA: Diagnosis not present

## 2022-06-09 DIAGNOSIS — D649 Anemia, unspecified: Secondary | ICD-10-CM | POA: Diagnosis not present

## 2022-06-09 DIAGNOSIS — Z7982 Long term (current) use of aspirin: Secondary | ICD-10-CM | POA: Diagnosis not present

## 2022-06-09 DIAGNOSIS — Z9181 History of falling: Secondary | ICD-10-CM | POA: Diagnosis not present

## 2022-06-09 DIAGNOSIS — Z466 Encounter for fitting and adjustment of urinary device: Secondary | ICD-10-CM | POA: Diagnosis not present

## 2022-06-09 DIAGNOSIS — R338 Other retention of urine: Secondary | ICD-10-CM | POA: Diagnosis not present

## 2022-06-09 DIAGNOSIS — N131 Hydronephrosis with ureteral stricture, not elsewhere classified: Secondary | ICD-10-CM | POA: Diagnosis not present

## 2022-06-09 DIAGNOSIS — Z435 Encounter for attention to cystostomy: Secondary | ICD-10-CM | POA: Diagnosis not present

## 2022-06-09 NOTE — Telephone Encounter (Signed)
Contacted [name] to schedule their annual wellness visit. Appointment made for 06/20/2022.  Thank you,  Colletta Maryland,  [place] [facility] Direct Dial ??[phone]

## 2022-06-13 ENCOUNTER — Encounter: Payer: Self-pay | Admitting: Family Medicine

## 2022-06-13 ENCOUNTER — Telehealth (INDEPENDENT_AMBULATORY_CARE_PROVIDER_SITE_OTHER): Payer: 59 | Admitting: Family Medicine

## 2022-06-13 NOTE — Progress Notes (Signed)
Patient's bowel issues go back to normal and he is doing fine.

## 2022-06-20 ENCOUNTER — Ambulatory Visit (INDEPENDENT_AMBULATORY_CARE_PROVIDER_SITE_OTHER): Payer: 59

## 2022-06-20 VITALS — Ht 61.0 in | Wt 172.0 lb

## 2022-06-20 DIAGNOSIS — Z0000 Encounter for general adult medical examination without abnormal findings: Secondary | ICD-10-CM | POA: Diagnosis not present
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
## 2022-06-20 NOTE — Patient Instructions (Signed)
Mr. Frank Levine , Thank you for taking time to come for your Medicare Wellness Visit. I appreciate your ongoing commitment to your health goals. Please review the following plan we discussed and let me know if I can assist you in the future.   These are the goals we discussed:  Goals      Exercise 3x per week (30 min per time)        This is a list of the screening recommended for you and due dates:  Health Maintenance  Topic Date Due   Colon Cancer Screening  Never done   COVID-19 Vaccine (3 - Moderna risk series) 07/19/2019   Flu Shot  10/06/2022   Medicare Annual Wellness Visit  06/20/2023   DTaP/Tdap/Td vaccine (2 - Td or Tdap) 04/01/2024   Hepatitis C Screening: USPSTF Recommendation to screen - Ages 37-79 yo.  Completed   HIV Screening  Completed   Zoster (Shingles) Vaccine  Completed   HPV Vaccine  Aged Out    Advanced directives: Advance directive discussed with you today. I have provided a copy for you to complete at home and have notarized. Once this is complete please bring a copy in to our office so we can scan it into your chart.   Conditions/risks identified: Aim for 30 minutes of exercise or brisk walking, 6-8 glasses of water, and 5 servings of fruits and vegetables each day.   Next appointment: Follow up in one year for your annual wellness visit   Preventive Care 40-64 Years, Male Preventive care refers to lifestyle choices and visits with your health care provider that can promote health and wellness. What does preventive care include? A yearly physical exam. This is also called an annual well check. Dental exams once or twice a year. Routine eye exams. Ask your health care provider how often you should have your eyes checked. Personal lifestyle choices, including: Daily care of your teeth and gums. Regular physical activity. Eating a healthy diet. Avoiding tobacco and drug use. Limiting alcohol use. Practicing safe sex. Taking low-dose aspirin every day  starting at age 53. What happens during an annual well check? The services and screenings done by your health care provider during your annual well check will depend on your age, overall health, lifestyle risk factors, and family history of disease. Counseling  Your health care provider may ask you questions about your: Alcohol use. Tobacco use. Drug use. Emotional well-being. Home and relationship well-being. Sexual activity. Eating habits. Work and work Astronomer. Screening  You may have the following tests or measurements: Height, weight, and BMI. Blood pressure. Lipid and cholesterol levels. These may be checked every 5 years, or more frequently if you are over 69 years old. Skin check. Lung cancer screening. You may have this screening every year starting at age 72 if you have a 30-pack-year history of smoking and currently smoke or have quit within the past 15 years. Fecal occult blood test (FOBT) of the stool. You may have this test every year starting at age 20. Flexible sigmoidoscopy or colonoscopy. You may have a sigmoidoscopy every 5 years or a colonoscopy every 10 years starting at age 67. Prostate cancer screening. Recommendations will vary depending on your family history and other risks. Hepatitis C blood test. Hepatitis B blood test. Sexually transmitted disease (STD) testing. Diabetes screening. This is done by checking your blood sugar (glucose) after you have not eaten for a while (fasting). You may have this done every 1-3 years. Discuss your test  results, treatment options, and if necessary, the need for more tests with your health care provider. Vaccines  Your health care provider may recommend certain vaccines, such as: Influenza vaccine. This is recommended every year. Tetanus, diphtheria, and acellular pertussis (Tdap, Td) vaccine. You may need a Td booster every 10 years. Zoster vaccine. You may need this after age 41. Pneumococcal 13-valent conjugate  (PCV13) vaccine. You may need this if you have certain conditions and have not been vaccinated. Pneumococcal polysaccharide (PPSV23) vaccine. You may need one or two doses if you smoke cigarettes or if you have certain conditions. Talk to your health care provider about which screenings and vaccines you need and how often you need them. This information is not intended to replace advice given to you by your health care provider. Make sure you discuss any questions you have with your health care provider. Document Released: 03/20/2015 Document Revised: 11/11/2015 Document Reviewed: 12/23/2014 Elsevier Interactive Patient Education  2017 [employer].  Fall Prevention in the Home Falls can cause injuries. They can happen to people of all ages. There are many things you can do to make your home safe and to help prevent falls. What can I do on the outside of my home? Regularly fix the edges of walkways and driveways and fix any cracks. Remove anything that might make you trip as you walk through a door, such as a raised step or threshold. Trim any bushes or trees on the path to your home. Use bright outdoor lighting. Clear any walking paths of anything that might make someone trip, such as rocks or tools. Regularly check to see if handrails are loose or broken. Make sure that both sides of any steps have handrails. Any raised decks and porches should have guardrails on the edges. Have any leaves, snow, or ice cleared regularly. Use sand or salt on walking paths during winter. Clean up any spills in your garage right away. This includes oil or grease spills. What can I do in the bathroom? Use night lights. Install grab bars by the toilet and in the tub and shower. Do not use towel bars as grab bars. Use non-skid mats or decals in the tub or shower. If you need to sit down in the shower, use a plastic, non-slip stool. Keep the floor dry. Clean up any water that spills on the floor as soon as it  happens. Remove soap buildup in the tub or shower regularly. Attach bath mats securely with double-sided non-slip rug tape. Do not have throw rugs and other things on the floor that can make you trip. What can I do in the bedroom? Use night lights. Make sure that you have a light by your bed that is easy to reach. Do not use any sheets or blankets that are too big for your bed. They should not hang down onto the floor. Have a firm chair that has side arms. You can use this for support while you get dressed. Do not have throw rugs and other things on the floor that can make you trip. What can I do in the kitchen? Clean up any spills right away. Avoid walking on wet floors. Keep items that you use a lot in easy-to-reach places. If you need to reach something above you, use a strong step stool that has a grab bar. Keep electrical cords out of the way. Do not use floor polish or wax that makes floors slippery. If you must use wax, use non-skid floor wax. Do  not have throw rugs and other things on the floor that can make you trip. What can I do with my stairs? Do not leave any items on the stairs. Make sure that there are handrails on both sides of the stairs and use them. Fix handrails that are broken or loose. Make sure that handrails are as long as the stairways. Check any carpeting to make sure that it is firmly attached to the stairs. Fix any carpet that is loose or worn. Avoid having throw rugs at the top or bottom of the stairs. If you do have throw rugs, attach them to the floor with carpet tape. Make sure that you have a light switch at the top of the stairs and the bottom of the stairs. If you do not have them, ask someone to add them for you. What else can I do to help prevent falls? Wear shoes that: Do not have high heels. Have rubber bottoms. Are comfortable and fit you well. Are closed at the toe. Do not wear sandals. If you use a stepladder: Make sure that it is fully opened.  Do not climb a closed stepladder. Make sure that both sides of the stepladder are locked into place. Ask someone to hold it for you, if possible. Clearly mark and make sure that you can see: Any grab bars or handrails. First and last steps. Where the edge of each step is. Use tools that help you move around (mobility aids) if they are needed. These include: Canes. Walkers. Scooters. Crutches. Turn on the lights when you go into a dark area. Replace any light bulbs as soon as they burn out. Set up your furniture so you have a clear path. Avoid moving your furniture around. If any of your floors are uneven, fix them. If there are any pets around you, be aware of where they are. Review your medicines with your doctor. Some medicines can make you feel dizzy. This can increase your chance of falling. Ask your doctor what other things that you can do to help prevent falls. This information is not intended to replace advice given to you by your health care provider. Make sure you discuss any questions you have with your health care provider. Document Released: 12/18/2008 Document Revised: 07/30/2015 Document Reviewed: 03/28/2014 Elsevier Interactive Patient Education  2017 [employer].

## 2022-07-01 DIAGNOSIS — N130 Hydronephrosis with ureteropelvic junction obstruction: Secondary | ICD-10-CM | POA: Diagnosis not present

## 2022-07-01 DIAGNOSIS — N3020 Other chronic cystitis without hematuria: Secondary | ICD-10-CM | POA: Diagnosis not present

## 2022-07-12 DIAGNOSIS — B351 Tinea unguium: Secondary | ICD-10-CM | POA: Diagnosis not present

## 2022-07-12 DIAGNOSIS — M79676 Pain in unspecified toe(s): Secondary | ICD-10-CM | POA: Diagnosis not present

## 2022-07-29 DIAGNOSIS — R339 Retention of urine, unspecified: Secondary | ICD-10-CM | POA: Diagnosis not present

## 2022-08-02 ENCOUNTER — Encounter: Payer: Self-pay | Admitting: Physician Assistant

## 2022-08-02 ENCOUNTER — Ambulatory Visit (INDEPENDENT_AMBULATORY_CARE_PROVIDER_SITE_OTHER): Payer: 59 | Admitting: Physician Assistant

## 2022-08-02 VITALS — BP 100/84 | HR 70 | Resp 18 | Ht 61.0 in | Wt 160.0 lb

## 2022-08-02 DIAGNOSIS — G301 Alzheimer's disease with late onset: Secondary | ICD-10-CM | POA: Diagnosis not present

## 2022-08-02 DIAGNOSIS — F0280 Dementia in other diseases classified elsewhere without behavioral disturbance: Secondary | ICD-10-CM | POA: Diagnosis not present

## 2022-08-02 DIAGNOSIS — G309 Alzheimer's disease, unspecified: Secondary | ICD-10-CM | POA: Diagnosis not present

## 2022-08-02 DIAGNOSIS — F02818 Dementia in other diseases classified elsewhere, unspecified severity, with other behavioral disturbance: Secondary | ICD-10-CM | POA: Diagnosis not present

## 2022-08-02 NOTE — Patient Instructions (Addendum)
It was a pleasure to see you today at our office.   Recommendations:  Follow up in  6  months Continue Depakote 125 mg at night, if necessary he may try to take 125 mg extra during the day if needed for agitation and hallucinations, or if he has severe insomnia at night he can up the Depakote to 250 mg at night Safety is first, monitor 24/7    Whom to call:  Memory  decline, memory medications: Call our office [phone]    For assessment of decision of mental capacity and competency:  Call Dr. Erick Blinks, geriatric psychiatrist at [phone]  For guidance in geriatric dementia issues please call [facility] [phone]  If you have any severe symptoms of a stroke, or other severe issues such as confusion,severe chills or fever, etc call 911 or go to the ER as you may need to be evaluated further   Feel free to visit Facebook page " [email or web address]" for tips of how to care for people with memory problems.      RECOMMENDATIONS FOR ALL PATIENTS WITH MEMORY PROBLEMS: 1. Continue to exercise (Recommend 30 minutes of walking everyday, or 3 hours every week) 2. Increase social interactions - continue going to [place] and enjoy social gatherings with friends and family 3. Eat healthy, avoid fried foods and eat more fruits and vegetables 4. Maintain adequate blood pressure, blood sugar, and blood cholesterol level. Reducing the risk of stroke and cardiovascular disease also helps promoting better memory. 5. Avoid stressful situations. Live a simple life and avoid aggravations. Organize your time and prepare for the next day in anticipation. 6. Sleep well, avoid any interruptions of sleep and avoid any distractions in the bedroom that may interfere with adequate sleep quality 7. Avoid sugar, avoid sweets as there is a strong link between excessive sugar intake, diabetes, and cognitive impairment We discussed the Mediterranean diet, which has been shown to help patients reduce the  risk of progressive memory disorders and reduces cardiovascular risk. This includes eating fish, eat fruits and green leafy vegetables, nuts like almonds and hazelnuts, walnuts, and also use olive oil. Avoid fast foods and fried foods as much as possible. Avoid sweets and sugar as sugar use has been linked to worsening of memory function.  There is always a concern of gradual progression of memory problems. If this is the case, then we may need to adjust level of care according to patient needs. Support, both to the patient and caregiver, should then be put into place.    FALL PRECAUTIONS: Be cautious when walking. Scan the area for obstacles that may increase the risk of trips and falls. When getting up in the mornings, sit up at the edge of the bed for a few minutes before getting out of bed. Consider elevating the bed at the head end to avoid drop of blood pressure when getting up. Walk always in a well-lit room (use night lights in the walls). Avoid area rugs or power cords from appliances in the middle of the walkways. Use a walker or a cane if necessary and consider physical therapy for balance exercise. Get your eyesight checked regularly.  FINANCIAL OVERSIGHT: Supervision, especially oversight when making financial decisions or transactions is also recommended.  HOME SAFETY: Consider the safety of the kitchen when operating appliances like stoves, microwave oven, and blender. Consider having supervision and share cooking responsibilities until no longer able to participate in those. Accidents with firearms and other hazards in the house  should be identified and addressed as well.   ABILITY TO BE LEFT ALONE: If patient is unable to contact 911 operator, consider using [facility], or when the need is there, arrange for someone to stay with patients. Smoking is a fire hazard, consider supervision or cessation. Risk of wandering should be assessed by caregiver and if detected at any point, supervision  and safe proof recommendations should be instituted.  MEDICATION SUPERVISION: Inability to self-administer medication needs to be constantly addressed. Implement a mechanism to ensure safe administration of the medications.
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
## 2022-08-16 ENCOUNTER — Other Ambulatory Visit: Payer: 59

## 2022-08-16 DIAGNOSIS — N1831 Chronic kidney disease, stage 3a: Secondary | ICD-10-CM | POA: Diagnosis not present

## 2022-08-16 DIAGNOSIS — E559 Vitamin D deficiency, unspecified: Secondary | ICD-10-CM | POA: Diagnosis not present

## 2022-08-16 DIAGNOSIS — I129 Hypertensive chronic kidney disease with stage 1 through stage 4 chronic kidney disease, or unspecified chronic kidney disease: Secondary | ICD-10-CM | POA: Diagnosis not present

## 2022-08-16 DIAGNOSIS — Z79899 Other long term (current) drug therapy: Secondary | ICD-10-CM | POA: Diagnosis not present

## 2022-08-16 DIAGNOSIS — N189 Chronic kidney disease, unspecified: Secondary | ICD-10-CM | POA: Diagnosis not present

## 2022-08-18 DIAGNOSIS — R809 Proteinuria, unspecified: Secondary | ICD-10-CM | POA: Diagnosis not present

## 2022-08-18 DIAGNOSIS — I129 Hypertensive chronic kidney disease with stage 1 through stage 4 chronic kidney disease, or unspecified chronic kidney disease: Secondary | ICD-10-CM | POA: Diagnosis not present

## 2022-08-18 DIAGNOSIS — N139 Obstructive and reflux uropathy, unspecified: Secondary | ICD-10-CM | POA: Diagnosis not present

## 2022-08-18 DIAGNOSIS — N1831 Chronic kidney disease, stage 3a: Secondary | ICD-10-CM | POA: Diagnosis not present

## 2022-08-19 ENCOUNTER — Ambulatory Visit (INDEPENDENT_AMBULATORY_CARE_PROVIDER_SITE_OTHER): Payer: 59 | Admitting: Family Medicine

## 2022-08-19 ENCOUNTER — Encounter: Payer: Self-pay | Admitting: Family Medicine

## 2022-08-19 VITALS — BP 127/76 | HR 90 | Temp 96.9°F | Ht 61.0 in | Wt 161.8 lb

## 2022-08-19 DIAGNOSIS — H6123 Impacted cerumen, bilateral: Secondary | ICD-10-CM

## 2022-08-19 NOTE — Patient Instructions (Addendum)
[employer]

## 2022-08-19 NOTE — Progress Notes (Signed)
   Acute Office Visit  Subjective:     Patient ID: Frank Levine, male    DOB: 05/08/1960, 62 y.o.   MRN: [id number]  Chief Complaint  Patient presents with   check ears     Pt presents today to have his ears checked. Pt denies pain. Able to hear when spoken to.     Review of Systems  HENT:         Concerned for cerumen impaction        Objective:    BP 127/76   Pulse 90   Temp (!) 96.9 F (36.1 C) (Temporal)   Ht 5\' 1"  (1.549 m)   Wt 161 lb 12.8 oz (73.4 kg)   SpO2 97%   BMI 30.57 kg/m    Physical Exam Vitals and nursing note reviewed.  Constitutional:      General: He is not in acute distress.    Appearance: He is not ill-appearing, toxic-appearing or diaphoretic.  HENT:     Right Ear: There is impacted cerumen.     Left Ear: There is impacted cerumen.     Mouth/Throat:     Mouth: Mucous membranes are moist.  Eyes:     Pupils: Pupils are equal, round, and reactive to light.  Cardiovascular:     Rate and Rhythm: Normal rate.  Pulmonary:     Effort: Pulmonary effort is normal.  Skin:    General: Skin is warm and dry.     Capillary Refill: Capillary refill takes less than 2 seconds.  Neurological:     Mental Status: He is alert. Mental status is at baseline.  Psychiatric:        Mood and Affect: Mood normal.         Assessment & Plan:   Problem List Items Addressed This Visit   None Visit Diagnoses     Bilateral impacted cerumen    -  Primary  Hard cerumen impaction bilaterally. Debrox for 2 weeks then to return for ear irrigation.       Return in about 2 weeks (around 09/02/2022), or if symptoms worsen or fail to improve, for ear irrigation .  Kari Baars, FNP

## 2022-08-26 DIAGNOSIS — R339 Retention of urine, unspecified: Secondary | ICD-10-CM | POA: Diagnosis not present

## 2022-09-02 ENCOUNTER — Ambulatory Visit: Payer: 59 | Admitting: Family Medicine

## 2022-09-20 DIAGNOSIS — M79676 Pain in unspecified toe(s): Secondary | ICD-10-CM | POA: Diagnosis not present

## 2022-09-20 DIAGNOSIS — B351 Tinea unguium: Secondary | ICD-10-CM | POA: Diagnosis not present

## 2022-09-23 DIAGNOSIS — R339 Retention of urine, unspecified: Secondary | ICD-10-CM | POA: Diagnosis not present

## 2022-09-26 ENCOUNTER — Ambulatory Visit: Payer: 59 | Admitting: Family Medicine

## 2022-10-13 ENCOUNTER — Encounter (HOSPITAL_COMMUNITY): Payer: Self-pay

## 2022-10-13 ENCOUNTER — Emergency Department (HOSPITAL_COMMUNITY): Payer: 59

## 2022-10-13 ENCOUNTER — Emergency Department (HOSPITAL_COMMUNITY)
Admission: EM | Admit: 2022-10-13 | Discharge: 2022-10-13 | Disposition: A | Discharge status: Other Acute Inpatient Hospital | Payer: 59 | Attending: Emergency Medicine | Admitting: Emergency Medicine

## 2022-10-13 ENCOUNTER — Encounter (HOSPITAL_COMMUNITY): Payer: Self-pay | Admitting: *Deleted

## 2022-10-13 ENCOUNTER — Other Ambulatory Visit: Payer: Self-pay

## 2022-10-13 ENCOUNTER — Emergency Department (HOSPITAL_COMMUNITY)
Admission: EM | Admit: 2022-10-13 | Discharge: 2022-10-13 | Disposition: A | Discharge status: Home / Self Care | Payer: 59 | Source: Home / Self Care | Attending: Emergency Medicine | Admitting: Emergency Medicine

## 2022-10-13 DIAGNOSIS — N319 Neuromuscular dysfunction of bladder, unspecified: Secondary | ICD-10-CM | POA: Diagnosis not present

## 2022-10-13 DIAGNOSIS — R339 Retention of urine, unspecified: Secondary | ICD-10-CM | POA: Insufficient documentation

## 2022-10-13 DIAGNOSIS — Z9359 Other cystostomy status: Secondary | ICD-10-CM

## 2022-10-13 DIAGNOSIS — Z7982 Long term (current) use of aspirin: Secondary | ICD-10-CM | POA: Insufficient documentation

## 2022-10-13 DIAGNOSIS — Z466 Encounter for fitting and adjustment of urinary device: Secondary | ICD-10-CM | POA: Diagnosis not present

## 2022-10-13 DIAGNOSIS — F0390 Unspecified dementia without behavioral disturbance: Secondary | ICD-10-CM | POA: Insufficient documentation

## 2022-10-13 DIAGNOSIS — T83091A Other mechanical complication of indwelling urethral catheter, initial encounter: Secondary | ICD-10-CM | POA: Diagnosis not present

## 2022-10-13 DIAGNOSIS — T83020A Displacement of cystostomy catheter, initial encounter: Secondary | ICD-10-CM | POA: Diagnosis not present

## 2022-10-13 DIAGNOSIS — T83098A Other mechanical complication of other indwelling urethral catheter, initial encounter: Secondary | ICD-10-CM | POA: Diagnosis not present

## 2022-10-13 DIAGNOSIS — T83090A Other mechanical complication of cystostomy catheter, initial encounter: Secondary | ICD-10-CM | POA: Diagnosis not present

## 2022-10-13 DIAGNOSIS — T83010A Breakdown (mechanical) of cystostomy catheter, initial encounter: Secondary | ICD-10-CM

## 2022-10-13 DIAGNOSIS — Y828 Other medical devices associated with adverse incidents: Secondary | ICD-10-CM | POA: Insufficient documentation

## 2022-10-13 HISTORY — PX: IR GUIDED DRAIN W CATHETER PLACEMENT: IMG719

## 2022-10-13 MED ORDER — LIDOCAINE HCL URETHRAL/MUCOSAL 2 % EX GEL: 1.0000 | Freq: Once | CUTANEOUS | Status: DC

## 2022-10-13 MED ORDER — IOHEXOL 300 MG/ML  SOLN
50.0000 mL | Freq: Once | INTRAMUSCULAR | Status: AC | PRN
  Administered 2022-10-13: 10 mL

## 2022-10-13 MED ORDER — LIDOCAINE-EPINEPHRINE 1 %-1:100000 IJ SOLN
INTRAMUSCULAR | Status: AC
  Filled 2022-10-13: qty 1

## 2022-10-13 MED ORDER — LIDOCAINE VISCOUS HCL 2 % MT SOLN
OROMUCOSAL | Status: AC
  Filled 2022-10-13: qty 15

## 2022-10-13 MED ORDER — SODIUM CHLORIDE 0.9 % IV SOLN: INTRAVENOUS | Status: DC

## 2022-10-13 NOTE — ED Notes (Signed)
Pt in IR 

## 2022-10-13 NOTE — ED Provider Notes (Signed)
[place] EMERGENCY DEPARTMENT AT [facility] Provider Note   CSN: [id number] Arrival date & time: 10/13/22  4098     History  No chief complaint on file.   Frank Levine is a 62 y.o. male.  HPI 62 year old male with a history of Down syndrome, dementia, MR, neurogenic bladder with suprapubic catheter, presents with a displaced suprapubic catheter.  At some point during the night he pulled out his catheter.  Is unclear exactly how long but it was found next to him on the bed.  Unfortunately the tract looks to have closed up according to caregiver.  Home Medications Prior to Admission medications   Medication Sig Start Date End Date Taking? Authorizing Provider  acetaminophen (TYLENOL) 325 MG tablet Take 650 mg by mouth every 6 (six) hours as needed.    [provider]  aspirin EC 81 MG tablet Take 81 mg by mouth daily.     [provider]  cetirizine (ZYRTEC) 5 MG tablet Take 5 mg by mouth daily.    [provider]  cholecalciferol (VITAMIN D3) 25 MCG (1000 UNIT) tablet Take 1,000 Units by mouth daily.    [provider]  clotrimazole (LOTRIMIN) 1 % cream Apply 1 Application topically daily as needed (itching/inflamation).    [provider]  diclofenac Sodium (VOLTAREN) 1 % GEL Apply 2 g topically daily as needed (pain).    [provider]  divalproex (DEPAKOTE) 125 MG DR tablet Take 1 tablet (125 mg total) by mouth at bedtime. 01/14/22   Marcos Eke, PA-C  docusate sodium (COLACE) 100 MG capsule Take 100 mg by mouth daily as needed for mild constipation.    [provider]  iron polysaccharides (NIFEREX) 150 MG capsule Take 150 mg by mouth daily.    [provider]  nitrofurantoin, macrocrystal-monohydrate, (MACROBID) 100 MG capsule Take 100 mg by mouth daily. 02/10/22   [provider]  nystatin cream (MYCOSTATIN) Apply 1 Application topically 4 (four) times daily. Apply to affected area  every 4-6 hours x 10 days 05/11/22   Mesner, Barbara Cower, MD  omeprazole (PRILOSEC) 40 MG capsule Take 1 capsule (40 mg total) by mouth daily. 05/23/22   Dettinger, Elige Radon, MD  polyethylene glycol (MIRALAX / GLYCOLAX) 17 g packet Take 17 g by mouth daily.    [provider]  simvastatin (ZOCOR) 40 MG tablet Take 1 tablet (40 mg total) by mouth daily at 6 PM. 11/24/21   Dettinger, Elige Radon, MD      Allergies    Patient has no known allergies.    Review of Systems   Review of Systems  Unable to perform ROS: Dementia    Physical Exam Updated Vital Signs BP 120/77 (BP Location: Right Arm)   Pulse 88   Temp 97.6 F (36.4 C) (Oral)   Resp 16   Ht 5\' 1"  (1.549 m)   Wt 73.4 kg   SpO2 99%   BMI 30.58 kg/m  Physical Exam Vitals and nursing note reviewed.  Constitutional:      Appearance: He is well-developed.  HENT:     Head: Normocephalic and atraumatic.  Pulmonary:     Effort: Pulmonary effort is normal.  Abdominal:     General: There is no distension.     Palpations: Abdomen is soft.     Comments: Suprapubic catheter site is currently closed up. No surrounding cellulitis.   Skin:    General: Skin is warm and dry.  Neurological:  Mental Status: He is alert.     ED Results / Procedures / Treatments   Labs (all labs ordered are listed, but only abnormal results are displayed) Labs Reviewed - No data to display  EKG None  Radiology No results found.  Procedures Procedures    Medications Ordered in ED Medications - No data to display  ED Course/ Medical Decision Making/ A&P                                 Medical Decision Making Amount and/or Complexity of Data Reviewed Independent Historian:     Details: Caregiver External Data Reviewed: notes.   Patient presents with suprapubic catheter dislodgment.  Unfortunately the stoma opening has completely closed.  There is just a pinhole open which would not fit any catheter size.  Due to this I discussed  with urology, Dr. Lafonda Mosses.  No urology available at this facility so we will send to [facility].  Caregiver will drive him POV.  He does have some urinary retention but his vitals are normal and he has been previously well so I do not think labs are needed at this time.  Appears stable for POV ED to ED transfer.  Made ED attending aware at [facility], Dr. Jearld Fenton.        Final Clinical Impression(s) / ED Diagnoses Final diagnoses:  Suprapubic catheter dysfunction, initial encounter [facility])    Rx / DC Orders ED Discharge Orders     None         Pricilla Loveless, MD 10/13/22 0930

## 2022-10-13 NOTE — Discharge Instructions (Signed)
Go straight to the [facility] emergency room.  Do not eat or drink on the way.  Check into the ER and then they will call the urologist to help replace his catheter.

## 2022-10-13 NOTE — ED Triage Notes (Signed)
Pt presents with c/o pulling out his suprapubic catheter. Caregiver at bedside reports that he pulled it out sometime during the night. Reports some spotting at the area.

## 2022-10-13 NOTE — ED Provider Notes (Signed)
  Physical Exam  BP (!) 115/100 (BP Location: Right Arm)   Pulse 94   Temp 98 F (36.7 C) (Oral)   Resp 20   SpO2 100%   Physical Exam Vitals and nursing note reviewed.  Constitutional:      General: He is not in acute distress.    Appearance: He is well-developed.  HENT:     Head: Normocephalic and atraumatic.  Eyes:     Conjunctiva/sclera: Conjunctivae normal.  Cardiovascular:     Rate and Rhythm: Normal rate and regular rhythm.     Heart sounds: No murmur heard. Pulmonary:     Effort: Pulmonary effort is normal. No respiratory distress.     Breath sounds: Normal breath sounds.  Abdominal:     Palpations: Abdomen is soft.     Tenderness: There is no abdominal tenderness. There is no guarding or rebound.     Comments: Suprapubic catheter site with minimal opening.  No surrounding cellulitis or bleeding.  Musculoskeletal:        General: No swelling.     Cervical back: Neck supple.  Skin:    General: Skin is warm and dry.     Capillary Refill: Capillary refill takes less than 2 seconds.  Neurological:     Mental Status: He is alert. Mental status is at baseline.  Psychiatric:        Mood and Affect: Mood normal.     Procedures  Procedures  ED Course / MDM    Medical Decision Making Amount and/or Complexity of Data Reviewed Radiology: ordered.   Patient transferred from [facility] for urologic evaluation for displaced suprapubic tube.  Patient is stable.  Abdominal exam is benign, low concern for bladder rupture, no indication for labs or imaging at this time.  Will discuss with urology for replacement.  Discussed with Cam with urology who also discussed with Dr. Lafonda Mosses.  Recommend IR consultation.  Discussed with Dr. Lowella Dandy with IR, they will attempt to replace suprapubic catheter.  Lab work was ordered by [employer] but not collected prior to procedure.  He was taken to IR and suprapubic catheter was placed.  He has good drainage of urine without evidence of hematuria.   His abdominal exam remains benign and he feels better.  I had a shared decision-making conversation with the patient's family at bedside regarding lab work.  Family and patient would like to go home.  Given his acute retention is related to super catheter displacement, and he has no fever or infectious symptoms or behavior changes I think is reasonable to defer lab work given we have established good urinary drainage.  I did give him strict return precautions for change in behavior, decreased urine output, fever or any other new concerning symptoms.  I recommended they follow-up with urology and PCP.  Discharged in stable condition.     Laurence Spates, MD 10/13/22 [phone]

## 2022-10-13 NOTE — ED Triage Notes (Signed)
Pt brought in by caregiver from home with c/o pulling out his suprapubic catheter
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
## 2022-10-13 NOTE — ED Notes (Signed)
Pt back in room.

## 2022-10-13 NOTE — Procedures (Signed)
Interventional Radiology Procedure:   Indications:  Neurogenic bladder with dislodged suprapubic tube  Procedure: Replacement of suprapubic tube with fluoroscopy  Findings: New 16 Fr Council catheter placed in bladder through old tract with fluoroscopy.  Complications: None     EBL: Minimal  Plan: Return to ED.    R. Lowella Dandy, MD  Pager: [phone]

## 2022-10-13 NOTE — Discharge Instructions (Signed)
The catheter was replaced by interventional radiology.  I recommend you call urology and his primary care doctor to schedule follow-up.  If he develops decreased drainage, abdominal pain, vomiting, fever he should return to the ED.

## 2022-10-18 ENCOUNTER — Telehealth: Payer: Self-pay

## 2022-10-18 NOTE — Transitions of Care (Post Inpatient/ED Visit) (Signed)
10/18/2022  Name: Frank Levine MRN: [id number] DOB: May 03, 1960  Today's TOC FU Call Status: Today's TOC FU Call Status:: Successful TOC FU Call Completed TOC FU Call Complete Date: 10/18/22  Transition Care Management Follow-up Telephone Call Date of Discharge: 10/13/22 Discharge Facility: [facility]) Type of Discharge: Emergency Department Reason for ED Visit: Other: (Replace Suprapubic Catherer) How have you been since you were released from the hospital?: Better (Per Patients cousin, legal guardian) Any questions or concerns?: No  Items Reviewed: Did you receive and understand the discharge instructions provided?: Yes Medications obtained,verified, and reconciled?: No Medications Not Reviewed Reasons:: Other: (EMMI Call) Any new allergies since your discharge?: No Dietary orders reviewed?: No Do you have support at home?: Yes People in Home: other relative(s) Name of Support/Comfort Primary Source: Ancil Boozer, cousin and legal guardian  Medications Reviewed Today: Medications Reviewed Today     Reviewed by Jodelle Gross, RN (Case Manager) on 10/18/22 at 1524  Med List Status: <None>   Medication Order Taking? Sig Documenting Provider Last Dose Status Informant  acetaminophen (TYLENOL) 325 MG tablet [id number] No Take 650 mg by mouth every 6 (six) hours as needed. [provider] Unknown Active Family Member           Med Note Electa Sniff, [facility]   Tue Oct 18, 2022  3:24 PM) TOC/EMMI nurse did not review medications  aspirin EC 81 MG tablet [id number]  Take 81 mg by mouth daily.  [provider]  Active Family Member  cetirizine (ZYRTEC) 5 MG tablet [id number]  Take 5 mg by mouth daily. [provider]  Active Family Member  cholecalciferol (VITAMIN D3) 25 MCG (1000 UNIT) tablet [id number]  Take 1,000 Units by mouth daily. [provider]  Active Family Member  clotrimazole (LOTRIMIN) 1 % cream [id number]  Apply 1 Application topically  daily as needed (itching/inflamation). [provider]  Active Family Member  diclofenac Sodium (VOLTAREN) 1 % GEL [id number]  Apply 2 g topically daily as needed (pain). [provider]  Active Family Member  divalproex (DEPAKOTE) 125 MG DR tablet [id number]  Take 1 tablet (125 mg total) by mouth at bedtime. Marcos Eke, PA-C  Active   docusate sodium (COLACE) 100 MG capsule [id number]  Take 100 mg by mouth daily as needed for mild constipation. [provider]  Active Family Member  iron polysaccharides (NIFEREX) 150 MG capsule [id number]  Take 150 mg by mouth daily. [provider]  Active Family Member  nitrofurantoin, macrocrystal-monohydrate, (MACROBID) 100 MG capsule [id number]  Take 100 mg by mouth daily. [provider]  Active   nystatin cream (MYCOSTATIN) [id number]  Apply 1 Application topically 4 (four) times daily. Apply to affected area every 4-6 hours x 10 days Mesner, Barbara Cower, MD  Active   omeprazole (PRILOSEC) 40 MG capsule [id number]  Take 1 capsule (40 mg total) by mouth daily. Dettinger, Elige Radon, MD  Active   polyethylene glycol (MIRALAX / GLYCOLAX) 17 g packet [id number]  Take 17 g by mouth daily. [provider]  Active Family Member  simvastatin (ZOCOR) 40 MG tablet [id number]  Take 1 tablet (40 mg total) by mouth daily at 6 PM. Dettinger, Elige Radon, MD  Active             Home Care and Equipment/Supplies: Were Home Health Services Ordered?: No Any new equipment or medical supplies ordered?: No  Functional Questionnaire: Do you need assistance with bathing/showering or dressing?: Yes Do you need assistance with meal  preparation?: Yes Do you need assistance with eating?: No Do you have difficulty maintaining continence: No Do you need assistance with getting out of bed/getting out of a chair/moving?: No Do you have difficulty managing or taking your medications?: Yes  Follow up appointments reviewed: PCP Follow-up  appointment confirmed?: No MD Provider Line [phone] Given: No (Legal guardian notes he does not feel patient needs a PCP follow up for current ED visit as patient has monthly appt. with Urologist.) Specialist Hospital Follow-up appointment confirmed?: No Reason Specialist Follow-Up Not Confirmed: Patient has Specialist Provider Number and will Call for Appointment Do you need transportation to your follow-up appointment?: No Do you understand care options if your condition(s) worsen?: Yes-patient verbalized understanding  SDOH Interventions Today    Flowsheet Row Most Recent Value  SDOH Interventions   Food Insecurity Interventions Intervention Not Indicated  Housing Interventions Intervention Not Indicated  Transportation Interventions Intervention Not Indicated      Jodelle Gross, RN, BSN, CCM Care Management Coordinator [facility] Phone: [phone]/Fax: [phone]

## 2022-10-20 ENCOUNTER — Encounter: Payer: Self-pay | Admitting: Family Medicine

## 2022-10-20 ENCOUNTER — Ambulatory Visit (INDEPENDENT_AMBULATORY_CARE_PROVIDER_SITE_OTHER): Payer: 59 | Admitting: Family Medicine

## 2022-10-20 VITALS — BP 104/65 | HR 94 | Temp 98.0°F | Ht 61.0 in | Wt 158.4 lb

## 2022-10-20 DIAGNOSIS — H6123 Impacted cerumen, bilateral: Secondary | ICD-10-CM | POA: Diagnosis not present

## 2022-10-20 NOTE — Progress Notes (Signed)
   Acute Office Visit  Subjective:     Patient ID: Frank Levine, male    DOB: 04-12-1960, 62 y.o.   MRN: [id number]  Chief Complaint  Patient presents with   Cerumen Impaction    HPI Here with guardian today. Patient is in today for ear wax impaction. He has bilateral ear fullness and pressure. Denies drainage or fever. Muffled hearing  ROS As per HPI.      Objective:    BP 104/65   Pulse 94   Temp 98 F (36.7 C) (Temporal)   Ht 5\' 1"  (1.549 m)   Wt 158 lb 6 oz (71.8 kg)   SpO2 98%   BMI 29.92 kg/m    Physical Exam Vitals and nursing note reviewed.  Constitutional:      General: He is not in acute distress.    Appearance: He is not ill-appearing, toxic-appearing or diaphoretic.  HENT:     Right Ear: External ear normal. There is impacted cerumen.     Left Ear: External ear normal. There is impacted cerumen.  Pulmonary:     Effort: Pulmonary effort is normal. No respiratory distress.  Neurological:     Mental Status: He is alert and oriented to person, place, and time. Mental status is at baseline.  Psychiatric:        Mood and Affect: Mood normal.        Behavior: Behavior normal.    Ear Cerumen Removal  Date/Time: 10/20/2022 9:37 AM  Performed by: Hessie Diener, LPN Authorized by: Gabriel Earing, FNP   Anesthesia: Local Anesthetic: none Location details: right ear and left ear Patient tolerance: patient tolerated the procedure well with no immediate complications Comments: Normal TMs visualized bilaterally following irrigation.  Procedure type: irrigation  Sedation: Patient sedated: no      No results found for any visits on 10/20/22.      Assessment & Plan:   Frank Levine was seen today for cerumen impaction.  Diagnoses and all orders for this visit:  Bilateral impacted cerumen Bilaterally irrigation today with good results. Continue debrox OTC prn.  -     Ear Cerumen Removal  Return to office for new or worsening symptoms, or if  symptoms persist.   The patient indicates understanding of these issues and agrees with the plan.  Gabriel Earing, FNP

## 2022-10-28 DIAGNOSIS — R339 Retention of urine, unspecified: Secondary | ICD-10-CM | POA: Diagnosis not present

## 2022-11-23 ENCOUNTER — Ambulatory Visit (INDEPENDENT_AMBULATORY_CARE_PROVIDER_SITE_OTHER): Payer: 59 | Admitting: Family Medicine

## 2022-11-23 ENCOUNTER — Encounter: Payer: Self-pay | Admitting: Family Medicine

## 2022-11-23 VITALS — BP 107/74 | HR 93 | Temp 98.1°F | Ht 61.0 in | Wt 155.6 lb

## 2022-11-23 DIAGNOSIS — Z9359 Other cystostomy status: Secondary | ICD-10-CM

## 2022-11-23 DIAGNOSIS — E782 Mixed hyperlipidemia: Secondary | ICD-10-CM | POA: Diagnosis not present

## 2022-11-23 DIAGNOSIS — Z23 Encounter for immunization: Secondary | ICD-10-CM | POA: Diagnosis not present

## 2022-11-23 DIAGNOSIS — K219 Gastro-esophageal reflux disease without esophagitis: Secondary | ICD-10-CM | POA: Diagnosis not present

## 2022-11-23 DIAGNOSIS — N183 Chronic kidney disease, stage 3 unspecified: Secondary | ICD-10-CM | POA: Diagnosis not present

## 2022-11-23 DIAGNOSIS — N35919 Unspecified urethral stricture, male, unspecified site: Secondary | ICD-10-CM

## 2022-11-23 NOTE — Progress Notes (Signed)
BP 107/74   Pulse 93   Temp 98.1 F (36.7 C) (Temporal)   Ht 5\' 1"  (1.549 m)   Wt 155 lb 9.6 oz (70.6 kg)   SpO2 98%   BMI 29.40 kg/m    Subjective:   Patient ID: Frank Levine, male    DOB: 02/12/1961, 62 y.o.   MRN: [id number]  HPI: Frank Levine is a 62 y.o. male presenting on 11/23/2022 for Medical Management of Chronic Issues (6 month)   HPI Hyperlipidemia Patient is coming in for recheck of his hyperlipidemia. The patient is currently taking simvastatin. They deny any issues with myalgias or history of liver damage from it. They deny any focal numbness or weakness or chest pain.   GERD Patient is currently on omeprazole.  She denies any major symptoms or abdominal pain or belching or burping. She denies any blood in her stool or lightheadedness or dizziness.   CKD recheck Patient has stage III CKD and sees nephrology and is coming in for recheck today.  Patient has Down syndrome and early onset Alzheimer's Coming in for recheck of this today and it does seem like it is gradually progressing.  Has family guardian here today.  Patient has urethral stricture and has suprapubic catheter because of this and has had suprapubic catheter for many years.  Relevant past medical, surgical, family and social history reviewed and updated as indicated. Interim medical history since our last visit reviewed. Allergies and medications reviewed and updated.  Review of Systems  Constitutional:  Negative for chills and fever.  Eyes:  Negative for visual disturbance.  Respiratory:  Negative for shortness of breath and wheezing.   Cardiovascular:  Negative for chest pain and leg swelling.  Musculoskeletal:  Negative for back pain and gait problem.  Skin:  Negative for rash.  Neurological:  Negative for dizziness, weakness and light-headedness.  All other systems reviewed and are negative.   Per HPI unless specifically indicated above   Allergies as of 11/23/2022   No Known  Allergies      Medication List        Accurate as of November 23, 2022  9:36 AM. If you have any questions, ask your nurse or doctor.          acetaminophen 325 MG tablet Commonly known as: TYLENOL Take 650 mg by mouth every 6 (six) hours as needed.   aspirin EC 81 MG tablet Take 81 mg by mouth daily.   cetirizine 5 MG tablet Commonly known as: ZYRTEC Take 5 mg by mouth daily.   cholecalciferol 25 MCG (1000 UNIT) tablet Commonly known as: VITAMIN D3 Take 1,000 Units by mouth daily.   clotrimazole 1 % cream Commonly known as: LOTRIMIN Apply 1 Application topically daily as needed (itching/inflamation).   diclofenac Sodium 1 % Gel Commonly known as: VOLTAREN Apply 2 g topically daily as needed (pain).   divalproex 125 MG DR tablet Commonly known as: Depakote Take 1 tablet (125 mg total) by mouth at bedtime.   docusate sodium 100 MG capsule Commonly known as: COLACE Take 100 mg by mouth daily as needed for mild constipation.   iron polysaccharides 150 MG capsule Commonly known as: NIFEREX Take 150 mg by mouth daily.   nitrofurantoin (macrocrystal-monohydrate) 100 MG capsule Commonly known as: MACROBID Take 100 mg by mouth daily.   nystatin cream Commonly known as: MYCOSTATIN Apply 1 Application topically 4 (four) times daily. Apply to affected area every 4-6 hours x 10 days  omeprazole 40 MG capsule Commonly known as: PRILOSEC Take 1 capsule (40 mg total) by mouth daily.   polyethylene glycol 17 g packet Commonly known as: MIRALAX / GLYCOLAX Take 17 g by mouth daily.   simvastatin 40 MG tablet Commonly known as: ZOCOR Take 1 tablet (40 mg total) by mouth daily at 6 PM.         Objective:   BP 107/74   Pulse 93   Temp 98.1 F (36.7 C) (Temporal)   Ht 5\' 1"  (1.549 m)   Wt 155 lb 9.6 oz (70.6 kg)   SpO2 98%   BMI 29.40 kg/m   Wt Readings from Last 3 Encounters:  11/23/22 155 lb 9.6 oz (70.6 kg)  10/20/22 158 lb 6 oz (71.8 kg)   10/13/22 161 lb 13.1 oz (73.4 kg)    Physical Exam Vitals and nursing note reviewed.  Constitutional:      General: He is not in acute distress.    Appearance: He is well-developed. He is not diaphoretic.  Eyes:     General: No scleral icterus.    Conjunctiva/sclera: Conjunctivae normal.  Neck:     Thyroid: No thyromegaly.  Cardiovascular:     Rate and Rhythm: Normal rate and regular rhythm.     Heart sounds: Normal heart sounds. No murmur heard. Pulmonary:     Effort: Pulmonary effort is normal. No respiratory distress.     Breath sounds: Normal breath sounds. No wheezing.  Abdominal:     General: Abdomen is flat. Bowel sounds are normal. There is no distension.     Tenderness: There is no abdominal tenderness. There is no guarding or rebound.  Musculoskeletal:        General: No swelling. Normal range of motion.     Cervical back: Neck supple.  Lymphadenopathy:     Cervical: No cervical adenopathy.  Skin:    General: Skin is warm and dry.     Findings: No rash.  Neurological:     Mental Status: He is alert and oriented to person, place, and time.     Gait: Gait (Slow wide gait) normal.  Psychiatric:        Behavior: Behavior normal.        Cognition and Memory: He exhibits impaired recent memory.       Assessment & Plan:   Problem List Items Addressed This Visit       Digestive   GERD (gastroesophageal reflux disease)     Genitourinary   CKD (chronic kidney disease) stage 3, GFR 30-59 ml/min (HCC) (Chronic)   Relevant Orders   CBC with Differential/Platelet   CMP14+EGFR   Urethral stricture     Other   Hyperlipidemia - Primary   Relevant Orders   Lipid panel   Chronic suprapubic catheter (HCC)    Seems to be doing well except her memory is slightly worsening.  He still has strength and is able to walk although somewhat less stable.  He had 1 fall recently and they are monitoring closely for anything that could cause another fall.  Modifications and  house have been made. Follow up plan: Return in about 6 months (around 05/23/2023), or if symptoms worsen or fail to improve, for Hyperlipidemia and GERD.  Counseling provided for all of the vaccine components Orders Placed This Encounter  Procedures   CBC with Differential/Platelet   CMP14+EGFR   Lipid panel    Arville Care, MD [facility] 11/23/2022, 9:36 AM

## 2022-11-24 LAB — CMP14+EGFR
ALT: 20 IU/L (ref 0–44)
AST: 23 IU/L (ref 0–40)
Albumin: 3.7 g/dL — ABNORMAL LOW (ref 3.9–4.9)
Alkaline Phosphatase: 96 IU/L (ref 44–121)
BUN/Creatinine Ratio: 16 (ref 10–24)
BUN: 27 mg/dL (ref 8–27)
Bilirubin Total: 0.4 mg/dL (ref 0.0–1.2)
CO2: 24 mmol/L (ref 20–29)
Calcium: 9.1 mg/dL (ref 8.6–10.2)
Chloride: 105 mmol/L (ref 96–106)
Creatinine, Ser: 1.68 mg/dL — ABNORMAL HIGH (ref 0.76–1.27)
Globulin, Total: 3.1 g/dL (ref 1.5–4.5)
Glucose: 90 mg/dL (ref 70–99)
Potassium: 4.6 mmol/L (ref 3.5–5.2)
Sodium: 144 mmol/L (ref 134–144)
Total Protein: 6.8 g/dL (ref 6.0–8.5)
eGFR: 45 mL/min/{1.73_m2} — ABNORMAL LOW (ref >59)

## 2022-11-24 LAB — CBC WITH DIFFERENTIAL/PLATELET
Basophils Absolute: 0.1 10*3/uL (ref 0.0–0.2)
Basos: 2 %
EOS (ABSOLUTE): 0.2 10*3/uL (ref 0.0–0.4)
Eos: 5 %
Hematocrit: 45.6 % (ref 37.5–51.0)
Hemoglobin: 15.4 g/dL (ref 13.0–17.7)
Immature Grans (Abs): 0 10*3/uL (ref 0.0–0.1)
Immature Granulocytes: 0 %
Lymphocytes Absolute: 1.4 10*3/uL (ref 0.7–3.1)
Lymphs: 36 %
MCH: 35.1 pg — ABNORMAL HIGH (ref 26.6–33.0)
MCHC: 33.8 g/dL (ref 31.5–35.7)
MCV: 104 fL — ABNORMAL HIGH (ref 79–97)
Monocytes Absolute: 0.4 10*3/uL (ref 0.1–0.9)
Monocytes: 9 %
Neutrophils Absolute: 1.8 10*3/uL (ref 1.4–7.0)
Neutrophils: 48 %
Platelets: 253 10*3/uL (ref 150–450)
RBC: 4.39 x10E6/uL (ref 4.14–5.80)
RDW: 12.7 % (ref 11.6–15.4)
WBC: 3.8 10*3/uL (ref 3.4–10.8)

## 2022-11-24 LAB — LIPID PANEL
Chol/HDL Ratio: 3.1 ratio (ref 0.0–5.0)
Cholesterol, Total: 172 mg/dL (ref 100–199)
HDL: 55 mg/dL (ref >39)
LDL Chol Calc (NIH): 103 mg/dL — ABNORMAL HIGH (ref 0–99)
Triglycerides: 74 mg/dL (ref 0–149)
VLDL Cholesterol Cal: 14 mg/dL (ref 5–40)

## 2022-11-29 ENCOUNTER — Other Ambulatory Visit: Payer: Self-pay | Admitting: Family Medicine

## 2022-11-29 DIAGNOSIS — E782 Mixed hyperlipidemia: Secondary | ICD-10-CM

## 2022-11-29 DIAGNOSIS — M79676 Pain in unspecified toe(s): Secondary | ICD-10-CM | POA: Diagnosis not present

## 2022-11-29 DIAGNOSIS — B351 Tinea unguium: Secondary | ICD-10-CM | POA: Diagnosis not present

## 2022-12-02 DIAGNOSIS — R339 Retention of urine, unspecified: Secondary | ICD-10-CM | POA: Diagnosis not present

## 2022-12-05 DIAGNOSIS — R809 Proteinuria, unspecified: Secondary | ICD-10-CM | POA: Diagnosis not present

## 2022-12-05 DIAGNOSIS — N1831 Chronic kidney disease, stage 3a: Secondary | ICD-10-CM | POA: Diagnosis not present

## 2022-12-05 DIAGNOSIS — I129 Hypertensive chronic kidney disease with stage 1 through stage 4 chronic kidney disease, or unspecified chronic kidney disease: Secondary | ICD-10-CM | POA: Diagnosis not present

## 2022-12-05 DIAGNOSIS — D7589 Other specified diseases of blood and blood-forming organs: Secondary | ICD-10-CM | POA: Diagnosis not present

## 2022-12-09 ENCOUNTER — Other Ambulatory Visit: Payer: Self-pay | Admitting: Family Medicine

## 2022-12-09 DIAGNOSIS — Z1212 Encounter for screening for malignant neoplasm of rectum: Secondary | ICD-10-CM

## 2022-12-09 DIAGNOSIS — Z1211 Encounter for screening for malignant neoplasm of colon: Secondary | ICD-10-CM

## 2022-12-24 ENCOUNTER — Other Ambulatory Visit: Payer: Self-pay

## 2022-12-24 ENCOUNTER — Emergency Department (HOSPITAL_COMMUNITY)
Admission: EM | Admit: 2022-12-24 | Discharge: 2022-12-24 | Disposition: A | Discharge status: Home / Self Care | Payer: 59 | Attending: Emergency Medicine | Admitting: Emergency Medicine

## 2022-12-24 DIAGNOSIS — Y732 Prosthetic and other implants, materials and accessory gastroenterology and urology devices associated with adverse incidents: Secondary | ICD-10-CM | POA: Diagnosis not present

## 2022-12-24 DIAGNOSIS — Z7982 Long term (current) use of aspirin: Secondary | ICD-10-CM | POA: Diagnosis not present

## 2022-12-24 DIAGNOSIS — I10 Essential (primary) hypertension: Secondary | ICD-10-CM | POA: Diagnosis not present

## 2022-12-24 DIAGNOSIS — N390 Urinary tract infection, site not specified: Secondary | ICD-10-CM | POA: Diagnosis not present

## 2022-12-24 DIAGNOSIS — T83198A Other mechanical complication of other urinary devices and implants, initial encounter: Secondary | ICD-10-CM | POA: Diagnosis not present

## 2022-12-24 DIAGNOSIS — T83510A Infection and inflammatory reaction due to cystostomy catheter, initial encounter: Secondary | ICD-10-CM | POA: Diagnosis not present

## 2022-12-24 DIAGNOSIS — I959 Hypotension, unspecified: Secondary | ICD-10-CM | POA: Diagnosis not present

## 2022-12-24 LAB — CBC WITH DIFFERENTIAL/PLATELET
Abs Immature Granulocytes: 0.03 10*3/uL (ref 0.00–0.07)
Basophils Absolute: 0.1 10*3/uL (ref 0.0–0.1)
Basophils Relative: 1 %
Eosinophils Absolute: 0.2 10*3/uL (ref 0.0–0.5)
Eosinophils Relative: 2 %
HCT: 43.3 % (ref 39.0–52.0)
Hemoglobin: 14.6 g/dL (ref 13.0–17.0)
Immature Granulocytes: 0 %
Lymphocytes Relative: 11 %
Lymphs Abs: 0.9 10*3/uL (ref 0.7–4.0)
MCH: 34.2 pg — ABNORMAL HIGH (ref 26.0–34.0)
MCHC: 33.7 g/dL (ref 30.0–36.0)
MCV: 101.4 fL — ABNORMAL HIGH (ref 80.0–100.0)
Monocytes Absolute: 0.7 10*3/uL (ref 0.1–1.0)
Monocytes Relative: 8 %
Neutro Abs: 6 10*3/uL (ref 1.7–7.7)
Neutrophils Relative %: 78 %
Platelets: 247 10*3/uL (ref 150–400)
RBC: 4.27 MIL/uL (ref 4.22–5.81)
RDW: 12.8 % (ref 11.5–15.5)
WBC: 7.8 10*3/uL (ref 4.0–10.5)
nRBC: 0 % (ref 0.0–0.2)

## 2022-12-24 LAB — BASIC METABOLIC PANEL
Anion gap: 7 (ref 5–15)
BUN: 28 mg/dL — ABNORMAL HIGH (ref 8–23)
CO2: 26 mmol/L (ref 22–32)
Calcium: 8.4 mg/dL — ABNORMAL LOW (ref 8.9–10.3)
Chloride: 105 mmol/L (ref 98–111)
Creatinine, Ser: 1.53 mg/dL — ABNORMAL HIGH (ref 0.61–1.24)
GFR, Estimated: 51 mL/min — ABNORMAL LOW (ref >60)
Glucose, Bld: 104 mg/dL — ABNORMAL HIGH (ref 70–99)
Potassium: 4 mmol/L (ref 3.5–5.1)
Sodium: 138 mmol/L (ref 135–145)

## 2022-12-24 LAB — URINALYSIS, ROUTINE W REFLEX MICROSCOPIC
Bilirubin Urine: NEGATIVE
Glucose, UA: NEGATIVE mg/dL
Ketones, ur: NEGATIVE mg/dL
Nitrite: POSITIVE — AB
Protein, ur: 30 mg/dL — AB
Specific Gravity, Urine: 1.01 (ref 1.005–1.030)
WBC, UA: >50 WBC/hpf (ref 0–5)
pH: 8 (ref 5.0–8.0)

## 2022-12-24 MED ORDER — CEFTRIAXONE SODIUM 1 G IJ SOLR
1.0000 g | Freq: Once | INTRAMUSCULAR | Status: AC
  Administered 2022-12-24: 1 g via INTRAMUSCULAR
  Filled 2022-12-24: qty 10

## 2022-12-24 MED ORDER — MUPIROCIN CALCIUM 2 % EX CREA: 1.0000 | TOPICAL_CREAM | Freq: Two times a day (BID) | CUTANEOUS | 0 refills | Status: DC

## 2022-12-24 MED ORDER — ACETAMINOPHEN 500 MG PO TABS
1000.0000 mg | ORAL_TABLET | Freq: Once | ORAL | Status: AC
  Administered 2022-12-24: 1000 mg via ORAL
  Filled 2022-12-24: qty 2

## 2022-12-24 MED ORDER — CEFPODOXIME PROXETIL 200 MG PO TABS: 200.0000 mg | ORAL_TABLET | Freq: Two times a day (BID) | ORAL | 0 refills | Status: DC

## 2022-12-24 MED ORDER — CEPHALEXIN 500 MG PO CAPS: 500.0000 mg | ORAL_CAPSULE | Freq: Once | ORAL | Status: DC

## 2022-12-24 NOTE — ED Triage Notes (Signed)
Pt bib ems from home for suprapubic cath issues. Per ems area appears red, drainage noted, pt also has pain when laid flat. Hx of Downs Syndrome.

## 2022-12-24 NOTE — Discharge Instructions (Addendum)
Seen today in the ER for drainage from the Foley catheter, there appears to be a UTI we are treating with antibiotics.  We have sent a urine culture.  You need to follow-up with the urologist who follows your catheter scheduled on Friday.  Has requested a prescribed the mupirocin ointment which you have been given in the past to put on the catheter site.  Come back for fever, abdominal pain, vomiting or any other new or worsening symptoms.

## 2022-12-24 NOTE — ED Notes (Signed)
Pt allowed to take night time meds per PA.

## 2022-12-24 NOTE — ED Provider Notes (Cosign Needed Addendum)
[facility] EMERGENCY DEPARTMENT AT [facility] Provider Note   CSN: [id number] Arrival date & time: 12/24/22  1147     History  Chief Complaint  Patient presents with   Medical Problem    Pt bib ems from home for suprapubic cath issues. Per ems area appears red, drainage noted, pt also has pain when laid flat. Hx of Downs Syndrome.    Frank Levine is a 62 y.o. male.  He has PMH of Down syndrome, neurogenic bladder with suprapubic Foley catheter.  Brought in by EMS from home for redness and drainage around the Foley catheter, EMS reported that he complained of pain when laying flat as well but patient denying complaints at this time.  HPI     Home Medications Prior to Admission medications   Medication Sig Start Date End Date Taking? Authorizing Provider  cefpodoxime (VANTIN) 200 MG tablet Take 1 tablet (200 mg total) by mouth 2 (two) times daily. 12/24/22  Yes Janean Eischen A, PA-C  mupirocin cream (BACTROBAN) 2 % Apply 1 Application topically 2 (two) times daily. 12/24/22  Yes Bennetta Rudden A, PA-C  acetaminophen (TYLENOL) 325 MG tablet Take 650 mg by mouth every 6 (six) hours as needed.    [provider]  aspirin EC 81 MG tablet Take 81 mg by mouth daily.     [provider]  cetirizine (ZYRTEC) 5 MG tablet Take 5 mg by mouth daily.    [provider]  cholecalciferol (VITAMIN D3) 25 MCG (1000 UNIT) tablet Take 1,000 Units by mouth daily.    [provider]  clotrimazole (LOTRIMIN) 1 % cream Apply 1 Application topically daily as needed (itching/inflamation).    [provider]  diclofenac Sodium (VOLTAREN) 1 % GEL Apply 2 g topically daily as needed (pain).    [provider]  divalproex (DEPAKOTE) 125 MG DR tablet Take 1 tablet (125 mg total) by mouth at bedtime. 01/14/22   Marcos Eke, PA-C  docusate sodium (COLACE) 100 MG capsule Take 100 mg by mouth daily as needed for mild constipation.    [provider]  iron polysaccharides (NIFEREX) 150 MG capsule Take 150 mg by mouth daily.    [provider]  nitrofurantoin, macrocrystal-monohydrate, (MACROBID) 100 MG capsule Take 100 mg by mouth daily. 02/10/22   [provider]  nystatin cream (MYCOSTATIN) Apply 1 Application topically 4 (four) times daily. Apply to affected area every 4-6 hours x 10 days 05/11/22   Mesner, Barbara Cower, MD  omeprazole (PRILOSEC) 40 MG capsule Take 1 capsule (40 mg total) by mouth daily. 05/23/22   Dettinger, Elige Radon, MD  polyethylene glycol (MIRALAX / GLYCOLAX) 17 g packet Take 17 g by mouth daily.    [provider]  simvastatin (ZOCOR) 40 MG tablet TAKE ONE TABLET ONCE DAILY AT 6PM 11/29/22   Dettinger, Elige Radon, MD      Allergies    Patient has no known allergies.    Review of Systems   Review of Systems  Physical Exam Updated Vital Signs BP (!) 118/104 (BP Location: Left Arm)   Pulse (!) 116   Temp 98.6 F (37 C) (Oral)   Resp 16   Ht 5\' 1"  (1.549 m)   Wt 70.6 kg   SpO2 99%   BMI 29.41 kg/m  Physical Exam Vitals and nursing note reviewed.  Constitutional:      General: He is not in acute distress.    Appearance: He is well-developed.  HENT:     Head: Normocephalic and atraumatic.     Mouth/Throat:     Mouth: Mucous membranes are moist.  Eyes:     Conjunctiva/sclera: Conjunctivae normal.  Cardiovascular:     Rate and Rhythm: Normal rate and regular rhythm.     Heart sounds: No murmur heard. Pulmonary:     Effort: Pulmonary effort is normal. No respiratory distress.     Breath sounds: Normal breath sounds.  Abdominal:     Palpations: Abdomen is soft.     Tenderness: There is no abdominal tenderness. There is no guarding or rebound.     Comments: Suprapubic catheter with small amount of surrounding purulent drainage easily wiped away with no underlying cellulitis  Musculoskeletal:        General: No swelling.     Cervical back: Neck supple.  Skin:    General:  Skin is warm and dry.     Capillary Refill: Capillary refill takes less than 2 seconds.  Neurological:     General: No focal deficit present.     Mental Status: He is alert. Mental status is at baseline.  Psychiatric:        Mood and Affect: Mood normal.     ED Results / Procedures / Treatments   Labs (all labs ordered are listed, but only abnormal results are displayed) Labs Reviewed  URINALYSIS, ROUTINE W REFLEX MICROSCOPIC - Abnormal; Notable for the following components:      Result Value   APPearance HAZY (*)    Hgb urine dipstick SMALL (*)    Protein, ur 30 (*)    Nitrite POSITIVE (*)    Leukocytes,Ua LARGE (*)    Bacteria, UA RARE (*)    All other components within normal limits  CBC WITH DIFFERENTIAL/PLATELET - Abnormal; Notable for the following components:   MCV 101.4 (*)    MCH 34.2 (*)    All other components within normal limits  BASIC METABOLIC PANEL - Abnormal; Notable for the following components:   Glucose, Bld 104 (*)    BUN 28 (*)    Creatinine, Ser 1.53 (*)    Calcium 8.4 (*)    GFR, Estimated 51 (*)    All other components within normal limits  URINE CULTURE  CBC WITH DIFFERENTIAL/PLATELET    EKG None  Radiology No results found.  Procedures Procedures    Medications Ordered in ED Medications  acetaminophen (TYLENOL) tablet 1,000 mg (has no administration in time range)  cefTRIAXone (ROCEPHIN) injection 1 g (1 g Intramuscular Given 12/24/22 1815)    ED Course/ Medical Decision Making/ A&P                                 Medical Decision Making DDx: UTI, Foley catheter malfunction, sepsis, other  Course: Patient has complaint today of drainage from his Foley catheter no fevers at home no abdominal pain on exam but EMS reported he was complaining of pain when laying flat.  There is no cellulitis, minimal drainage he does appear to have a UTI on initial urine sent down, replace the Foley catheter cleansed area was started Rocephin and  sent urine culture.  I specifically asked nursing to send culture on the new urine specimen to obtain accurate sample.  I reviewed old urine cultures, most recently patient had Klebsiella that was only resistant to ampicillin and intermediate to Macrobid so we will start patient on a cephalosporin and  have him follow-up closely with urology.  Initially mildly tachycardic, partially patient leaving hallway waiting for bed to change his catheter, giving him some oral rehydration given his reassuring labs with baseline renal function no leukocytosis, no electrolyte derangements and we will recheck his heart rate.  He has dry oral mucosa feel his mild tachycardia is likely related to that.  He does not meet any other sirs criteria.  Heart rate on EKG is 106 bpm.  Patient is smiling and appears much more comfortable after placement of Foley catheter.  His caregiver is at bedside requesting prescription for mupirocin ointment which she has gotten in the past after Foley catheter changes when he was having drainage so this was prescribed.  Patient has soft abdomen no tenderness except for mild tenderness at the suprapubic catheter site.  There is no crepitus.  Labs are reassuring with baseline renal function, patient is tolerating p.o. fluids here.  Do not feel there is any indication for further labs/imaging   Amount and/or Complexity of Data Reviewed Labs: ordered.  Risk OTC drugs. Prescription drug management.           Final Clinical Impression(s) / ED Diagnoses Final diagnoses:  Urinary tract infection associated with cystostomy catheter, initial encounter [facility])    Rx / DC Orders ED Discharge Orders          Ordered    cefpodoxime (VANTIN) 200 MG tablet  2 times daily        12/24/22 1938    mupirocin cream (BACTROBAN) 2 %  2 times daily        12/24/22 1939              Josem Kaufmann 12/24/22 1942    Josem Kaufmann 12/24/22 1953    Eber Hong,  MD 12/25/22 [phone]

## 2022-12-25 ENCOUNTER — Telehealth (HOSPITAL_COMMUNITY): Payer: Self-pay | Admitting: Emergency Medicine

## 2022-12-25 MED ORDER — MUPIROCIN CALCIUM 2 % EX CREA: 1.0000 | TOPICAL_CREAM | Freq: Two times a day (BID) | CUTANEOUS | 0 refills | Status: DC

## 2022-12-25 MED ORDER — CEFPODOXIME PROXETIL 200 MG PO TABS: 200.0000 mg | ORAL_TABLET | Freq: Two times a day (BID) | ORAL | 0 refills | Status: DC

## 2022-12-25 NOTE — ED Notes (Signed)
[facility] requested pharmacy to be changed due to theirs was closed today. EDP changed presciptions and [facility] aware. 1125hrs . 12/25/22

## 2022-12-25 NOTE — Telephone Encounter (Signed)
Sent to new pharmacy as original was closed

## 2022-12-27 LAB — URINE CULTURE: Culture: 40000 — AB

## 2022-12-28 ENCOUNTER — Telehealth (HOSPITAL_BASED_OUTPATIENT_CLINIC_OR_DEPARTMENT_OTHER): Payer: Self-pay

## 2022-12-28 NOTE — Telephone Encounter (Signed)
Post ED Visit - Positive Culture Follow-up  Culture report reviewed by antimicrobial stewardship pharmacist: [facility] []  Enzo Bi, Pharm.D. []  Celedonio Miyamoto, Pharm.D., BCPS AQ-ID []  Garvin Fila, Pharm.D., BCPS []  Georgina Pillion, Pharm.D., BCPS []  [place], [address].D., BCPS, AAHIVP []  Estella Husk, Pharm.D., BCPS, AAHIVP []  Lysle Pearl, PharmD, BCPS []  Phillips Climes, PharmD, BCPS []  Agapito Games, PharmD, BCPS [x]  Ivery Quale, PharmD []  Mervyn Gay, PharmD, BCPS []  Vinnie Level, PharmD  [facility] []  Len Childs, PharmD []  Greer Pickerel, PharmD []  Adalberto Cole, PharmD []  Perlie Gold, Rph []  Lonell Face) Jean Rosenthal, PharmD []  Earl Many, PharmD []  Junita Push, PharmD []  Dorna Leitz, PharmD []  Terrilee Files, PharmD []  Lynann Beaver, PharmD []  Keturah Barre, PharmD []  Loralee Pacas, PharmD []  Bernadene Person, PharmD   Positive urine culture Treated with Cefpodoxime Proxetil, organism sensitive to the same and no further patient follow-up is required at this time.  Sandria Senter 12/28/2022, 9:11 AM

## 2023-01-05 ENCOUNTER — Telehealth: Payer: Self-pay | Admitting: Physician Assistant

## 2023-01-05 ENCOUNTER — Other Ambulatory Visit: Payer: Self-pay | Admitting: Physician Assistant

## 2023-01-05 MED ORDER — DIVALPROEX SODIUM 125 MG PO DR TAB: 125.0000 mg | DELAYED_RELEASE_TABLET | Freq: Two times a day (BID) | ORAL | 11 refills | Status: DC

## 2023-01-05 NOTE — Progress Notes (Signed)
Sent Depakote 125 mg twice a day, may increase to 3 times a day if needed. Thanks

## 2023-01-05 NOTE — Telephone Encounter (Signed)
Left message with the after hour service on 01-05-23 at 12:53 pm  Caller states that patient is getting worse, he is not walking on his own and wants to only go to the chair to the bed. He is not feeding himself. He is also having night tare and screams and talks all night long. Caller states he wants to know if there is a medication that can help

## 2023-01-06 NOTE — Telephone Encounter (Signed)
I advised Rx has been sent in to [facility], she thanked me for calling. They will call office with an update in a few weeks.

## 2023-01-09 DIAGNOSIS — R339 Retention of urine, unspecified: Secondary | ICD-10-CM | POA: Diagnosis not present

## 2023-01-09 DIAGNOSIS — N3020 Other chronic cystitis without hematuria: Secondary | ICD-10-CM | POA: Diagnosis not present

## 2023-01-09 DIAGNOSIS — N130 Hydronephrosis with ureteropelvic junction obstruction: Secondary | ICD-10-CM | POA: Diagnosis not present

## 2023-01-19 ENCOUNTER — Telehealth: Payer: Self-pay | Admitting: Family Medicine

## 2023-01-19 NOTE — Telephone Encounter (Signed)
Copied from CRM [id number]. Topic: General - Other >> Jan 19, 2023  1:06 PM Tiffany H wrote: Reason for CRM: Patient called to request FL2 form from Dr. Louanne Skye. Patient is trying to get into assisted living for memory care and needs proof of TB test along with an FL2 form. Please contact patient about TB test/immunization.   Patient requests it be sent to:   Ancil Boozer [address]  [place], [place] [address]  Patient would like to be contacted when letter has been sent out at: [phone]

## 2023-01-19 NOTE — Telephone Encounter (Signed)
Appt scheduled for 11/20 at 1:40pm

## 2023-01-23 ENCOUNTER — Telehealth: Payer: Self-pay | Admitting: Family Medicine

## 2023-01-23 ENCOUNTER — Telehealth (INDEPENDENT_AMBULATORY_CARE_PROVIDER_SITE_OTHER): Payer: 59 | Admitting: Physician Assistant

## 2023-01-23 DIAGNOSIS — G309 Alzheimer's disease, unspecified: Secondary | ICD-10-CM | POA: Diagnosis not present

## 2023-01-23 DIAGNOSIS — F0280 Dementia in other diseases classified elsewhere without behavioral disturbance: Secondary | ICD-10-CM | POA: Diagnosis not present

## 2023-01-23 NOTE — Telephone Encounter (Signed)
It can be virtual. Just make sure they are able to actually do a virtual visit with their device.  If they want a tb test they will have to either schedule a nurse visit for injection or make a lab appt for bloodwork. ( Dettinger will have to make that decision)

## 2023-01-23 NOTE — Telephone Encounter (Signed)
Changed appt

## 2023-01-23 NOTE — Telephone Encounter (Signed)
Made appt note that visit is now a video visit and listed cell phone to send link to. Guardian is going to check and see if Pallative Care can do the TB testing. Will call us back to schedule if they cannot.   Not authorized to change visit type to video visit.

## 2023-01-23 NOTE — Telephone Encounter (Signed)
Is it ok with Dr Dettinger? Please advise and I will call pt back.  Copied from CRM [id number]. Topic: General - Other >> Jan 23, 2023  1:09 PM Deaijah H wrote: Reason for CRM: Guardian of Pt called in and was curious if Dr. Louanne Skye could changed appt on 01/25/23 to a virtual visit instead of office
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
## 2023-01-24 NOTE — Telephone Encounter (Signed)
I sent referral to [facility] for referral. Copy sent to scan.

## 2023-01-25 ENCOUNTER — Telehealth (INDEPENDENT_AMBULATORY_CARE_PROVIDER_SITE_OTHER): Payer: 59 | Admitting: Family Medicine

## 2023-01-25 ENCOUNTER — Encounter: Payer: Self-pay | Admitting: Family Medicine

## 2023-01-25 DIAGNOSIS — Z9359 Other cystostomy status: Secondary | ICD-10-CM | POA: Diagnosis not present

## 2023-01-25 DIAGNOSIS — G300 Alzheimer's disease with early onset: Secondary | ICD-10-CM

## 2023-01-25 DIAGNOSIS — F0280 Dementia in other diseases classified elsewhere without behavioral disturbance: Secondary | ICD-10-CM

## 2023-01-25 DIAGNOSIS — Z789 Other specified health status: Secondary | ICD-10-CM | POA: Diagnosis not present

## 2023-01-25 DIAGNOSIS — Q909 Down syndrome, unspecified: Secondary | ICD-10-CM

## 2023-01-25 NOTE — Progress Notes (Signed)
Virtual Visit via MyChart video note  I connected with Frank Levine on 01/25/23 at 1333 by video and verified that I am speaking with the correct person using two identifiers. Frank Levine is currently located at home and  guardian Frank Levine  are currently with her during visit. The provider, Elige Radon Alexius Ellington, MD is located in their office at time of visit.  Call ended at 1353  I discussed the limitations, risks, security and privacy concerns of performing an evaluation and management service by video and the availability of in person appointments. I also discussed with the patient that there may be a patient responsible charge related to this service. The patient expressed understanding and agreed to proceed.   History and Present Illness: Frank Levine is losing ability to stand and walk and is not able to get around the house on his own.  He is choking on solids and is transitioning to liquid diet per neurology. His memory is worsening. He is having difficulty feeding, and dressing and bathing. He is not able to brush teeth on his own anymore.  His family has looked into a facility, [facility] has been referred as well. They are looking at facilities towards [place] because they live close to there.   1. Early onset Alzheimer's dementia without behavioral disturbance (HCC)   2. Chronic suprapubic catheter (HCC)   3. Down's syndrome   4. Deficit in activities of daily living (ADL)     Outpatient Encounter Medications as of 01/25/2023  Medication Sig   acetaminophen (TYLENOL) 325 MG tablet Take 650 mg by mouth every 6 (six) hours as needed.   aspirin EC 81 MG tablet Take 81 mg by mouth daily.    cefpodoxime (VANTIN) 200 MG tablet Take 1 tablet (200 mg total) by mouth 2 (two) times daily.   cetirizine (ZYRTEC) 5 MG tablet Take 5 mg by mouth daily.   cholecalciferol (VITAMIN D3) 25 MCG (1000 UNIT) tablet Take 1,000 Units by mouth daily.   clotrimazole (LOTRIMIN) 1 % cream  Apply 1 Application topically daily as needed (itching/inflamation).   diclofenac Sodium (VOLTAREN) 1 % GEL Apply 2 g topically daily as needed (pain).   divalproex (DEPAKOTE) 125 MG DR tablet Take 1 tablet (125 mg total) by mouth 2 (two) times daily. May increase to 3 times daily if needed   docusate sodium (COLACE) 100 MG capsule Take 100 mg by mouth daily as needed for mild constipation.   iron polysaccharides (NIFEREX) 150 MG capsule Take 150 mg by mouth daily.   mupirocin cream (BACTROBAN) 2 % Apply 1 Application topically 2 (two) times daily.   nitrofurantoin, macrocrystal-monohydrate, (MACROBID) 100 MG capsule Take 100 mg by mouth daily.   nystatin cream (MYCOSTATIN) Apply 1 Application topically 4 (four) times daily. Apply to affected area every 4-6 hours x 10 days   omeprazole (PRILOSEC) 40 MG capsule Take 1 capsule (40 mg total) by mouth daily.   polyethylene glycol (MIRALAX / GLYCOLAX) 17 g packet Take 17 g by mouth daily.   simvastatin (ZOCOR) 40 MG tablet TAKE ONE TABLET ONCE DAILY AT 6PM   No facility-administered encounter medications on file as of 01/25/2023.    Review of Systems  Constitutional:  Negative for chills and fever.  Eyes:  Negative for visual disturbance.  Respiratory:  Negative for shortness of breath and wheezing.   Cardiovascular:  Negative for chest pain and leg swelling.  Genitourinary:  Positive for frequency.  Skin:  Negative for rash.  Neurological:  Negative for dizziness and light-headedness.  Psychiatric/Behavioral:  Positive for confusion, decreased concentration and sleep disturbance.   All other systems reviewed and are negative.   Observations/Objective: Patient sounds comfortable and in no acute distress  Assessment and Plan: Problem List Items Addressed This Visit       Nervous and Auditory   Early onset Alzheimer's dementia without behavioral disturbance (HCC) - Primary     Other   Chronic suprapubic catheter (HCC)   Down's  syndrome   Other Visit Diagnoses     Deficit in activities of daily living (ADL)           Will do FL2 form that he would like [place] to fax a copy to him at [phone], attention Ancil Boozer Follow up plan: Return if symptoms worsen or fail to improve.     I discussed the assessment and treatment plan with the patient. The patient was provided an opportunity to ask questions and all were answered. The patient agreed with the plan and demonstrated an understanding of the instructions.   The patient was advised to call back or seek an in-person evaluation if the symptoms worsen or if the condition fails to improve as anticipated.  The above assessment and management plan was discussed with the patient. The patient verbalized understanding of and has agreed to the management plan. Patient is aware to call the clinic if symptoms persist or worsen. Patient is aware when to return to the clinic for a follow-up visit. Patient educated on when it is appropriate to go to the emergency department.    I provided 20 minutes of non-face-to-face time during this encounter.    Nils Pyle, MD

## 2023-01-26 ENCOUNTER — Telehealth: Payer: Self-pay | Admitting: Family Medicine

## 2023-01-26 ENCOUNTER — Telehealth: Payer: Self-pay | Admitting: *Deleted

## 2023-01-26 NOTE — Telephone Encounter (Signed)
Fax went through to [place] at [phone], verified fax for [name] & refaxed to [phone] Wrote note on fax for [name] that I know there is a Teacher, [facility] as a foreign language care place in [facility] Kathryne Sharper Ph# [phone] Fax [phone]

## 2023-01-26 NOTE — Telephone Encounter (Signed)
Copied from CRM [id number]. Topic: Medical Record Request - Other >> Jan 26, 2023  1:06 PM Prudencio Pair wrote: Reason for CRM: Ancil Boozer, guardian of pt, calling stating that Dr. Louanne Skye was suppose to fax over [facility] form and they haven't received anything. They are requesting for those to be faxed to the first number provided and [name] also provided a second fax number as well to send to, Fax #: [phone] & "attention to [facility]".

## 2023-01-26 NOTE — Telephone Encounter (Signed)
Talked w/ [name] & Almira Coaster gordan who [name] is working with for placement of pt, d/t the suprapubic catheter & non-ambulatory, recommended level of care needed to be changed to SNF instead of memory care. PCP signed new FL2 and this was faxed to [facility]

## 2023-01-26 NOTE — Telephone Encounter (Signed)
Patient has pretty significant dementia and memory issues, that is where we usually put from memory care unit.  With anybody who has significant dementia we usually have to put that on there

## 2023-01-26 NOTE — Telephone Encounter (Signed)
Closing encounter addressed in 01/23/23 TC encounter

## 2023-01-26 NOTE — Telephone Encounter (Signed)
I think Olegario Messier was working on the [employer] to fax over, she did have the number, I do not know if she is here today but we can check on that when she is next available.  She can add this new fax number to his case manager, Almira Coaster

## 2023-01-26 NOTE — Telephone Encounter (Signed)
Copied from CRM [id number]. Topic: General - Other >> Jan 26, 2023  1:42 PM Alphonzo Lemmings O wrote: Reason for CRM: Ancil Boozer patoient guardian calling about some paperwork that was faxed and Dr dettinger faxed back but it some things on the form he needs to reconsider. Memory care patient needs skilled nursing the patient is not memory care. Patient is also chair or bed bound . Cam Dr resend form and make those corrections please. [phone]  Mr [name] call back number

## 2023-02-17 ENCOUNTER — Telehealth: Payer: Self-pay | Admitting: Family Medicine

## 2023-03-08 NOTE — Telephone Encounter (Signed)
FYI

## 2023-03-08 NOTE — Telephone Encounter (Signed)
Thanks for letting me know!

## 2023-03-08 DEATH — deceased

## 2023-05-24 ENCOUNTER — Ambulatory Visit: Payer: 59 | Admitting: Family Medicine
